# Patient Record
Sex: Female | Born: 1935 | Race: White | Hispanic: No | Smoking: Never smoker
Health system: Southern US, Community
[De-identification: ages and names within clinical notes are randomized; demographics above are authoritative.]

## PROBLEM LIST (undated history)

## (undated) DIAGNOSIS — E785 Hyperlipidemia, unspecified: Secondary | ICD-10-CM

## (undated) DIAGNOSIS — M1990 Unspecified osteoarthritis, unspecified site: Secondary | ICD-10-CM

## (undated) DIAGNOSIS — K219 Gastro-esophageal reflux disease without esophagitis: Secondary | ICD-10-CM

## (undated) DIAGNOSIS — R55 Syncope and collapse: Secondary | ICD-10-CM

## (undated) DIAGNOSIS — I639 Cerebral infarction, unspecified: Secondary | ICD-10-CM

## (undated) DIAGNOSIS — I493 Ventricular premature depolarization: Secondary | ICD-10-CM

## (undated) DIAGNOSIS — S82202A Unspecified fracture of shaft of left tibia, initial encounter for closed fracture: Secondary | ICD-10-CM

## (undated) DIAGNOSIS — D62 Acute posthemorrhagic anemia: Secondary | ICD-10-CM

## (undated) DIAGNOSIS — I10 Essential (primary) hypertension: Secondary | ICD-10-CM

## (undated) DIAGNOSIS — G629 Polyneuropathy, unspecified: Secondary | ICD-10-CM

## (undated) HISTORY — PX: APPENDECTOMY: SHX54

## (undated) HISTORY — DX: Acute posthemorrhagic anemia: D62

## (undated) HISTORY — DX: Unspecified fracture of shaft of left tibia, initial encounter for closed fracture: S82.202A

## (undated) HISTORY — DX: Ventricular premature depolarization: I49.3

## (undated) HISTORY — PX: JOINT REPLACEMENT: SHX530

## (undated) HISTORY — PX: ABDOMINAL HYSTERECTOMY: SHX81

## (undated) HISTORY — PX: BACK SURGERY: SHX140

## (undated) HISTORY — DX: Polyneuropathy, unspecified: G62.9

## (undated) HISTORY — DX: Syncope and collapse: R55

---

## 2004-03-23 ENCOUNTER — Ambulatory Visit: Payer: Self-pay | Admitting: Physician Assistant

## 2004-04-04 ENCOUNTER — Inpatient Hospital Stay: Payer: Self-pay | Admitting: Unknown Physician Specialty

## 2004-11-17 ENCOUNTER — Ambulatory Visit: Payer: Self-pay | Admitting: Internal Medicine

## 2005-11-20 ENCOUNTER — Ambulatory Visit: Payer: Self-pay | Admitting: Internal Medicine

## 2006-02-09 ENCOUNTER — Ambulatory Visit: Payer: Self-pay | Admitting: Unknown Physician Specialty

## 2006-11-29 ENCOUNTER — Ambulatory Visit: Payer: Self-pay | Admitting: Internal Medicine

## 2007-03-06 ENCOUNTER — Ambulatory Visit: Payer: Self-pay | Admitting: Internal Medicine

## 2007-04-03 ENCOUNTER — Ambulatory Visit: Payer: Self-pay | Admitting: Unknown Physician Specialty

## 2007-04-11 ENCOUNTER — Ambulatory Visit: Payer: Self-pay | Admitting: Unknown Physician Specialty

## 2007-11-28 ENCOUNTER — Ambulatory Visit: Payer: Self-pay | Admitting: Internal Medicine

## 2007-12-03 ENCOUNTER — Ambulatory Visit: Payer: Self-pay | Admitting: Internal Medicine

## 2008-02-10 ENCOUNTER — Ambulatory Visit: Payer: Self-pay | Admitting: Unknown Physician Specialty

## 2008-02-19 ENCOUNTER — Ambulatory Visit: Payer: Self-pay | Admitting: Internal Medicine

## 2008-02-20 ENCOUNTER — Ambulatory Visit: Payer: Self-pay | Admitting: Internal Medicine

## 2008-02-24 ENCOUNTER — Inpatient Hospital Stay: Payer: Self-pay | Admitting: Unknown Physician Specialty

## 2008-03-22 ENCOUNTER — Ambulatory Visit: Payer: Self-pay | Admitting: Internal Medicine

## 2008-03-30 ENCOUNTER — Ambulatory Visit: Payer: Self-pay | Admitting: Internal Medicine

## 2008-04-21 ENCOUNTER — Ambulatory Visit: Payer: Self-pay | Admitting: Internal Medicine

## 2008-05-22 HISTORY — PX: REPLACEMENT TOTAL KNEE BILATERAL: SUR1225

## 2008-06-12 ENCOUNTER — Ambulatory Visit: Payer: Self-pay | Admitting: Internal Medicine

## 2008-06-22 ENCOUNTER — Ambulatory Visit: Payer: Self-pay | Admitting: Internal Medicine

## 2008-08-11 ENCOUNTER — Ambulatory Visit: Payer: Self-pay | Admitting: Internal Medicine

## 2008-08-20 ENCOUNTER — Ambulatory Visit: Payer: Self-pay | Admitting: Internal Medicine

## 2008-09-19 ENCOUNTER — Ambulatory Visit: Payer: Self-pay | Admitting: Internal Medicine

## 2008-10-09 ENCOUNTER — Ambulatory Visit: Payer: Self-pay | Admitting: Internal Medicine

## 2008-10-20 ENCOUNTER — Ambulatory Visit: Payer: Self-pay | Admitting: Internal Medicine

## 2008-12-28 ENCOUNTER — Ambulatory Visit: Payer: Self-pay | Admitting: Internal Medicine

## 2009-01-20 ENCOUNTER — Ambulatory Visit: Payer: Self-pay | Admitting: Unknown Physician Specialty

## 2009-02-02 ENCOUNTER — Inpatient Hospital Stay: Payer: Self-pay | Admitting: Unknown Physician Specialty

## 2010-01-05 ENCOUNTER — Ambulatory Visit: Payer: Self-pay | Admitting: Internal Medicine

## 2011-01-09 ENCOUNTER — Ambulatory Visit: Payer: Self-pay | Admitting: Internal Medicine

## 2011-02-06 ENCOUNTER — Ambulatory Visit: Payer: Self-pay | Admitting: Unknown Physician Specialty

## 2011-03-01 ENCOUNTER — Ambulatory Visit: Payer: Self-pay | Admitting: Unknown Physician Specialty

## 2011-03-14 ENCOUNTER — Ambulatory Visit: Payer: Self-pay | Admitting: Unknown Physician Specialty

## 2012-01-10 ENCOUNTER — Ambulatory Visit: Payer: Self-pay

## 2012-05-22 DIAGNOSIS — Z85828 Personal history of other malignant neoplasm of skin: Secondary | ICD-10-CM

## 2012-05-22 HISTORY — DX: Personal history of other malignant neoplasm of skin: Z85.828

## 2013-01-13 ENCOUNTER — Ambulatory Visit: Payer: Self-pay

## 2014-01-20 ENCOUNTER — Ambulatory Visit: Payer: Self-pay

## 2014-08-09 DIAGNOSIS — Z96653 Presence of artificial knee joint, bilateral: Secondary | ICD-10-CM | POA: Insufficient documentation

## 2015-03-12 ENCOUNTER — Other Ambulatory Visit: Payer: Self-pay | Admitting: Infectious Diseases

## 2015-03-12 DIAGNOSIS — Z1231 Encounter for screening mammogram for malignant neoplasm of breast: Secondary | ICD-10-CM

## 2015-03-17 ENCOUNTER — Other Ambulatory Visit: Payer: Self-pay | Admitting: Infectious Diseases

## 2015-03-17 ENCOUNTER — Ambulatory Visit
Admission: RE | Admit: 2015-03-17 | Discharge: 2015-03-17 | Disposition: A | Discharge status: Home / Self Care | Payer: Medicare Other | Source: Ambulatory Visit | Attending: Infectious Diseases | Admitting: Infectious Diseases

## 2015-03-17 DIAGNOSIS — Z1231 Encounter for screening mammogram for malignant neoplasm of breast: Secondary | ICD-10-CM

## 2015-11-25 DIAGNOSIS — R5383 Other fatigue: Secondary | ICD-10-CM | POA: Insufficient documentation

## 2015-11-25 DIAGNOSIS — R42 Dizziness and giddiness: Secondary | ICD-10-CM | POA: Insufficient documentation

## 2015-11-27 ENCOUNTER — Emergency Department: Payer: Medicare Other

## 2015-11-27 ENCOUNTER — Encounter: Payer: Self-pay | Admitting: Emergency Medicine

## 2015-11-27 ENCOUNTER — Inpatient Hospital Stay
Admission: EM | Admit: 2015-11-27 | Discharge: 2015-11-29 | DRG: 066 | Disposition: A | Discharge status: Home / Self Care | Payer: Medicare Other | Attending: Internal Medicine | Admitting: Internal Medicine

## 2015-11-27 DIAGNOSIS — Z9049 Acquired absence of other specified parts of digestive tract: Secondary | ICD-10-CM

## 2015-11-27 DIAGNOSIS — I10 Essential (primary) hypertension: Secondary | ICD-10-CM | POA: Diagnosis present

## 2015-11-27 DIAGNOSIS — R42 Dizziness and giddiness: Secondary | ICD-10-CM

## 2015-11-27 DIAGNOSIS — R29700 NIHSS score 0: Secondary | ICD-10-CM | POA: Diagnosis present

## 2015-11-27 DIAGNOSIS — I639 Cerebral infarction, unspecified: Principal | ICD-10-CM | POA: Diagnosis present

## 2015-11-27 DIAGNOSIS — Z96653 Presence of artificial knee joint, bilateral: Secondary | ICD-10-CM | POA: Diagnosis present

## 2015-11-27 DIAGNOSIS — Z888 Allergy status to other drugs, medicaments and biological substances status: Secondary | ICD-10-CM

## 2015-11-27 DIAGNOSIS — Z803 Family history of malignant neoplasm of breast: Secondary | ICD-10-CM

## 2015-11-27 DIAGNOSIS — Z79899 Other long term (current) drug therapy: Secondary | ICD-10-CM

## 2015-11-27 DIAGNOSIS — K219 Gastro-esophageal reflux disease without esophagitis: Secondary | ICD-10-CM | POA: Diagnosis present

## 2015-11-27 DIAGNOSIS — M1990 Unspecified osteoarthritis, unspecified site: Secondary | ICD-10-CM | POA: Diagnosis present

## 2015-11-27 DIAGNOSIS — Z8249 Family history of ischemic heart disease and other diseases of the circulatory system: Secondary | ICD-10-CM

## 2015-11-27 DIAGNOSIS — Z90710 Acquired absence of both cervix and uterus: Secondary | ICD-10-CM

## 2015-11-27 DIAGNOSIS — I739 Peripheral vascular disease, unspecified: Secondary | ICD-10-CM | POA: Diagnosis present

## 2015-11-27 DIAGNOSIS — G4710 Hypersomnia, unspecified: Secondary | ICD-10-CM | POA: Diagnosis present

## 2015-11-27 HISTORY — DX: Unspecified osteoarthritis, unspecified site: M19.90

## 2015-11-27 HISTORY — DX: Gastro-esophageal reflux disease without esophagitis: K21.9

## 2015-11-27 LAB — COMPREHENSIVE METABOLIC PANEL
ALT: 18 U/L (ref 14–54)
ANION GAP: 6 (ref 5–15)
AST: 22 U/L (ref 15–41)
Albumin: 3.7 g/dL (ref 3.5–5.0)
Alkaline Phosphatase: 54 U/L (ref 38–126)
BUN: 17 mg/dL (ref 6–20)
CHLORIDE: 100 mmol/L — AB (ref 101–111)
CO2: 27 mmol/L (ref 22–32)
CREATININE: 0.88 mg/dL (ref 0.44–1.00)
Calcium: 8.8 mg/dL — ABNORMAL LOW (ref 8.9–10.3)
GFR calc non Af Amer: >60 mL/min (ref >60)
Glucose, Bld: 105 mg/dL — ABNORMAL HIGH (ref 65–99)
POTASSIUM: 4.3 mmol/L (ref 3.5–5.1)
SODIUM: 133 mmol/L — AB (ref 135–145)
Total Bilirubin: 0.3 mg/dL (ref 0.3–1.2)
Total Protein: 6.3 g/dL — ABNORMAL LOW (ref 6.5–8.1)

## 2015-11-27 LAB — URINALYSIS COMPLETE WITH MICROSCOPIC (ARMC ONLY)
Bilirubin Urine: NEGATIVE
GLUCOSE, UA: NEGATIVE mg/dL
HGB URINE DIPSTICK: NEGATIVE
Ketones, ur: NEGATIVE mg/dL
Nitrite: NEGATIVE
PROTEIN: NEGATIVE mg/dL
Specific Gravity, Urine: 1.006 (ref 1.005–1.030)
pH: 5 (ref 5.0–8.0)

## 2015-11-27 LAB — CBC WITH DIFFERENTIAL/PLATELET
Basophils Absolute: 0 10*3/uL (ref 0–0.1)
Basophils Relative: 1 %
Eosinophils Absolute: 0.2 10*3/uL (ref 0–0.7)
Eosinophils Relative: 3 %
HEMATOCRIT: 34.7 % — AB (ref 35.0–47.0)
HEMOGLOBIN: 12 g/dL (ref 12.0–16.0)
LYMPHS ABS: 1.8 10*3/uL (ref 1.0–3.6)
LYMPHS PCT: 32 %
MCH: 32 pg (ref 26.0–34.0)
MCHC: 34.5 g/dL (ref 32.0–36.0)
MCV: 92.8 fL (ref 80.0–100.0)
MONOS PCT: 10 %
Monocytes Absolute: 0.5 10*3/uL (ref 0.2–0.9)
NEUTROS ABS: 3 10*3/uL (ref 1.4–6.5)
NEUTROS PCT: 54 %
Platelets: 185 10*3/uL (ref 150–440)
RBC: 3.74 MIL/uL — ABNORMAL LOW (ref 3.80–5.20)
RDW: 13.7 % (ref 11.5–14.5)
WBC: 5.5 10*3/uL (ref 3.6–11.0)

## 2015-11-27 LAB — TROPONIN I

## 2015-11-27 MED ORDER — SODIUM CHLORIDE 0.9 % IV BOLUS (SEPSIS)
1000.0000 mL | Freq: Once | INTRAVENOUS | Status: AC
  Administered 2015-11-28: 1000 mL via INTRAVENOUS

## 2015-11-27 NOTE — ED Provider Notes (Signed)
[facility] Emergency Department Provider Note   ____________________________________________  Time seen: Approximately 11:13 PM  I have reviewed the triage vital signs and the nursing notes.   HISTORY  Chief Complaint Dizziness    HPI [name] is a 80 y.o. female comes into the hospital today with some headache and dizziness. The patient reports that she's been having these symptoms for the last few days. She reports that she feels as though she feels lightheaded without the room spinning. The patient went to occur note clinic on 76 and was told by the PA that her sodium was low. The patient was told to restrict her water intake to 220 mL daily. She reports though that she's been feeling very tired. She reports that she was on her back porch with her dogs 1 afternoon and fell asleep without realizing it. The patient woke up 2 hours later. She reports that she is to go back for blood work but she has been feeling nauseous and just not feeling right. She reports that the headache was mostly around her eyes and she was also given a Z-Pak. She reports that she has no headache currently and also denies chest pain, shortness of breath, abdominal pain, fevers, diarrhea, vomiting. She reports she does have some blurred vision but she has that due to Fuchs dystrophy. The patient reports that she was on a river cruise in [place] and when she came back she started having the symptoms of dizziness. She is here for evaluation today.   Past Medical History  Diagnosis Date  . Arthritis   . GERD (gastroesophageal reflux disease)     There are no active problems to display for this patient.   Past Surgical History  Procedure Laterality Date  . Replacement total knee bilateral  2010  . Appendectomy    . Abdominal hysterectomy      Current Outpatient Rx  Name  Route  Sig  Dispense  Refill  . ALPRAZolam (XANAX) 0.25 MG tablet   Oral   Take 1 tablet by mouth 3  (three) times daily.         Marland Kitchen azelastine (ASTELIN) 0.1 % nasal spray   Each Nare   Place 1 spray into both nostrils daily.       5   . azithromycin (ZITHROMAX) 250 MG tablet   Oral   Take 1 tablet by mouth daily.      1   . Coenzyme Q10 (CO Q 10) 100 MG CAPS   Oral   Take 100 mg by mouth daily.         Marland Kitchen esomeprazole (NEXIUM) 40 MG capsule   Oral   Take 1 capsule by mouth 2 (two) times daily.         Marland Kitchen etodolac (LODINE) 400 MG tablet   Oral   Take 1 tablet by mouth 2 (two) times daily.         . Multiple Vitamins-Minerals (MULTIVITAMIN WITH MINERALS) tablet   Oral   Take 1 tablet by mouth daily.         . sodium chloride (MURO 128) 5 % ophthalmic ointment   Both Eyes   Place 1 application into both eyes at bedtime.         . VESICARE 5 MG tablet   Oral   Take 1 tablet by mouth daily.           Dispense as written.   . vitamin A 8000 UNIT capsule  Oral   Take 8,000 Units by mouth daily.         . vitamin E 400 UNIT capsule   Oral   Take 400 Units by mouth daily.           Allergies Tramadol-acetaminophen  Family History  Problem Relation Age of Onset  . Breast cancer Maternal Grandmother     Social History Social History  Substance Use Topics  . Smoking status: Never Smoker   . Smokeless tobacco: None  . Alcohol Use: No    Review of Systems Constitutional: No fever/chills Eyes: Intermittently blurred vision ENT: No sore throat. Cardiovascular: Denies chest pain. Respiratory: Denies shortness of breath. Gastrointestinal: No abdominal pain.  No nausea, no vomiting.  No diarrhea.  No constipation. Genitourinary: Negative for dysuria. Musculoskeletal: Negative for back pain. Skin: Negative for rash. Neurological: Headache and lightheadedness  10-point ROS otherwise negative.  ____________________________________________   PHYSICAL EXAM:  VITAL SIGNS: ED Triage Vitals  Enc Vitals Group     BP 11/27/15 2039 169/67  mmHg     Pulse Rate 11/27/15 2039 71     Resp 11/27/15 2039 18     Temp 11/27/15 2039 98 F (36.7 C)     Temp Source 11/27/15 2039 Oral     SpO2 11/27/15 2039 99 %     Weight 11/27/15 2039 190 lb (86.183 kg)     Height 11/27/15 2039 5\' 9"  (1.753 m)     Head Cir --      Peak Flow --      Pain Score --      Pain Loc --      Pain Edu? --      Excl. in [place]? --     Constitutional: Alert and oriented. Well appearing and in no acute distress. Eyes: Conjunctivae are normal. PERRL. EOMI. Head: Atraumatic. Nose: No congestion/rhinnorhea. Mouth/Throat: Mucous membranes are moist.  Oropharynx non-erythematous. Cardiovascular: Normal rate, regular rhythm. Grossly normal heart sounds.  Good peripheral circulation. Respiratory: Normal respiratory effort.  No retractions. Lungs CTAB. Gastrointestinal: Soft and nontender. No distention. Positive bowel sounds  Musculoskeletal: No lower extremity tenderness nor edema.   Neurologic:  Normal speech and language. Cranial nerves II through XII are grossly intact with no focal motor or neuro deficits Skin:  Skin is warm, dry and intact.  Psychiatric: Mood and affect are normal.   ____________________________________________   LABS (all labs ordered are listed, but only abnormal results are displayed)  Labs Reviewed  COMPREHENSIVE METABOLIC PANEL - Abnormal; Notable for the following:    Sodium 133 (*)    Chloride 100 (*)    Glucose, Bld 105 (*)    Calcium 8.8 (*)    Total Protein 6.3 (*)    All other components within normal limits  CBC WITH DIFFERENTIAL/PLATELET - Abnormal; Notable for the following:    RBC 3.74 (*)    HCT 34.7 (*)    All other components within normal limits  URINALYSIS COMPLETEWITH MICROSCOPIC ([facility] ONLY) - Abnormal; Notable for the following:    Color, Urine YELLOW (*)    APPearance CLEAR (*)    Leukocytes, UA 2+ (*)    Bacteria, UA RARE (*)    Squamous Epithelial / LPF 0-5 (*)    All other components within normal  limits  TROPONIN I  MAGNESIUM  PHOSPHORUS  TSH   ____________________________________________  EKG  ED ECG REPORT I, Loney Hering, the attending physician, personally viewed and interpreted this ECG.  Date: 11/27/2015  EKG Time: 2249  Rate: 66  Rhythm: normal sinus rhythm  Axis: Left axis deviation  Intervals:right bundle branch block and left anterior fascicular block  ST&T Change: None  ____________________________________________  RADIOLOGY  CT head: Finding suspicious for subacute ischemia in the posterior limb of the left internal capsule, No intracranial hemorrhage or hemorrhagic transformation ____________________________________________   PROCEDURES  Procedure(s) performed: None  Procedures  Critical Care performed: No  ____________________________________________   INITIAL IMPRESSION / ASSESSMENT AND PLAN / ED COURSE  Pertinent labs & imaging results that were available during my care of the patient were reviewed by me and considered in my medical decision making (see chart for details).  This is a 80 year old female who comes into the hospital today with some lightheadedness, dizziness and nausea. I did look back at the patient's visit and it appears as though her sodium was 125 at the time. Her sodium is currently 133 I will do some orthostatics to determine if the patient has some dehydration. I will also do a CT scan of the patient's head. I will reassess the patient once I receive the remainder of her blood work.  The patient was not orthostatic. She did receive a liter of normal saline. CT does appear to have a finding of a subacute stroke. Given the patient's symptoms of dizziness I feel that the patient needs to be admitted to the hospital for further evaluation. I discussed these findings with the patient. She will be admitted to the hospitalist service. ____________________________________________   FINAL CLINICAL IMPRESSION(S) / ED  DIAGNOSES  Final diagnoses:  Dizziness  Ischemic stroke ([place])      NEW MEDICATIONS STARTED DURING THIS VISIT:  New Prescriptions   No medications on file     Note:  This document was prepared using Dragon voice recognition software and may include unintentional dictation errors.    Loney Hering, MD 11/28/15 [phone]

## 2015-11-27 NOTE — ED Notes (Signed)
Pt ambulatory to triage with c/o of being dizzy, sleepy and nauseous since returning from [place] (vacation) on 20 June 17.  Pt denies fever/HA/V/D.  Pt reports dizziness worse today than usual, visited PCP on Wednesday and rx'd dx of "Low sodium" and has been restricting water intake since.  Pt reports dizziness worse when going from a sitting to standing position.

## 2015-11-28 ENCOUNTER — Inpatient Hospital Stay: Payer: Medicare Other

## 2015-11-28 ENCOUNTER — Inpatient Hospital Stay
Admit: 2015-11-28 | Discharge: 2015-11-28 | Disposition: A | Discharge status: Home / Self Care | Payer: Medicare Other | Attending: Family Medicine | Admitting: Family Medicine

## 2015-11-28 DIAGNOSIS — G4710 Hypersomnia, unspecified: Secondary | ICD-10-CM | POA: Diagnosis present

## 2015-11-28 DIAGNOSIS — I639 Cerebral infarction, unspecified: Secondary | ICD-10-CM | POA: Diagnosis present

## 2015-11-28 DIAGNOSIS — M1990 Unspecified osteoarthritis, unspecified site: Secondary | ICD-10-CM | POA: Diagnosis present

## 2015-11-28 DIAGNOSIS — K219 Gastro-esophageal reflux disease without esophagitis: Secondary | ICD-10-CM | POA: Diagnosis present

## 2015-11-28 DIAGNOSIS — Z96653 Presence of artificial knee joint, bilateral: Secondary | ICD-10-CM | POA: Diagnosis present

## 2015-11-28 DIAGNOSIS — I739 Peripheral vascular disease, unspecified: Secondary | ICD-10-CM | POA: Diagnosis present

## 2015-11-28 DIAGNOSIS — Z79899 Other long term (current) drug therapy: Secondary | ICD-10-CM | POA: Diagnosis not present

## 2015-11-28 DIAGNOSIS — R42 Dizziness and giddiness: Secondary | ICD-10-CM | POA: Diagnosis present

## 2015-11-28 DIAGNOSIS — R29700 NIHSS score 0: Secondary | ICD-10-CM | POA: Diagnosis present

## 2015-11-28 DIAGNOSIS — Z888 Allergy status to other drugs, medicaments and biological substances status: Secondary | ICD-10-CM | POA: Diagnosis not present

## 2015-11-28 DIAGNOSIS — Z9049 Acquired absence of other specified parts of digestive tract: Secondary | ICD-10-CM | POA: Diagnosis not present

## 2015-11-28 DIAGNOSIS — Z803 Family history of malignant neoplasm of breast: Secondary | ICD-10-CM | POA: Diagnosis not present

## 2015-11-28 DIAGNOSIS — Z90710 Acquired absence of both cervix and uterus: Secondary | ICD-10-CM | POA: Diagnosis not present

## 2015-11-28 DIAGNOSIS — I10 Essential (primary) hypertension: Secondary | ICD-10-CM | POA: Diagnosis present

## 2015-11-28 DIAGNOSIS — Z8249 Family history of ischemic heart disease and other diseases of the circulatory system: Secondary | ICD-10-CM | POA: Diagnosis not present

## 2015-11-28 LAB — BASIC METABOLIC PANEL
ANION GAP: 4 — AB (ref 5–15)
BUN: 14 mg/dL (ref 6–20)
CO2: 28 mmol/L (ref 22–32)
Calcium: 8.6 mg/dL — ABNORMAL LOW (ref 8.9–10.3)
Chloride: 108 mmol/L (ref 101–111)
Creatinine, Ser: 0.57 mg/dL (ref 0.44–1.00)
GFR calc Af Amer: >60 mL/min (ref >60)
Glucose, Bld: 86 mg/dL (ref 65–99)
POTASSIUM: 4.1 mmol/L (ref 3.5–5.1)
SODIUM: 140 mmol/L (ref 135–145)

## 2015-11-28 LAB — LIPID PANEL
CHOL/HDL RATIO: 3.2 ratio
Cholesterol: 179 mg/dL (ref 0–200)
HDL: 56 mg/dL (ref >40)
LDL Cholesterol: 111 mg/dL — ABNORMAL HIGH (ref 0–99)
TRIGLYCERIDES: 59 mg/dL (ref <150)
VLDL: 12 mg/dL (ref 0–40)

## 2015-11-28 LAB — CBC
HEMATOCRIT: 36.3 % (ref 35.0–47.0)
HEMOGLOBIN: 12.1 g/dL (ref 12.0–16.0)
MCH: 31.6 pg (ref 26.0–34.0)
MCHC: 33.3 g/dL (ref 32.0–36.0)
MCV: 95.1 fL (ref 80.0–100.0)
Platelets: 185 10*3/uL (ref 150–440)
RBC: 3.82 MIL/uL (ref 3.80–5.20)
RDW: 13.6 % (ref 11.5–14.5)
WBC: 5.9 10*3/uL (ref 3.6–11.0)

## 2015-11-28 LAB — ECHOCARDIOGRAM COMPLETE
Height: 69 in
Weight: 3064 oz

## 2015-11-28 LAB — PHOSPHORUS: Phosphorus: 4.5 mg/dL (ref 2.5–4.6)

## 2015-11-28 LAB — TSH: TSH: 1.991 u[IU]/mL (ref 0.350–4.500)

## 2015-11-28 LAB — MAGNESIUM: Magnesium: 1.9 mg/dL (ref 1.7–2.4)

## 2015-11-28 MED ORDER — DARIFENACIN HYDROBROMIDE ER 7.5 MG PO TB24
7.5000 mg | ORAL_TABLET | Freq: Every day | ORAL | Status: DC
  Administered 2015-11-28 – 2015-11-29 (×2): 7.5 mg via ORAL
  Filled 2015-11-28 (×2): qty 1

## 2015-11-28 MED ORDER — ONDANSETRON HCL 4 MG PO TABS: 4.0000 mg | ORAL_TABLET | Freq: Four times a day (QID) | ORAL | Status: DC | PRN

## 2015-11-28 MED ORDER — PANTOPRAZOLE SODIUM 40 MG PO TBEC
40.0000 mg | DELAYED_RELEASE_TABLET | Freq: Every day | ORAL | Status: DC
  Administered 2015-11-28 – 2015-11-29 (×2): 40 mg via ORAL
  Filled 2015-11-28 (×2): qty 1

## 2015-11-28 MED ORDER — SODIUM CHLORIDE 0.9% FLUSH
3.0000 mL | Freq: Two times a day (BID) | INTRAVENOUS | Status: DC
  Administered 2015-11-28 – 2015-11-29 (×5): 3 mL via INTRAVENOUS

## 2015-11-28 MED ORDER — ONDANSETRON HCL 4 MG/2ML IJ SOLN: 4.0000 mg | Freq: Four times a day (QID) | INTRAMUSCULAR | Status: DC | PRN

## 2015-11-28 MED ORDER — SODIUM CHLORIDE (HYPERTONIC) 5 % OP OINT
1.0000 "application " | TOPICAL_OINTMENT | Freq: Every day | OPHTHALMIC | Status: DC
  Administered 2015-11-28: 1 via OPHTHALMIC
  Filled 2015-11-28: qty 3.5

## 2015-11-28 MED ORDER — ENOXAPARIN SODIUM 40 MG/0.4ML ~~LOC~~ SOLN
40.0000 mg | Freq: Every day | SUBCUTANEOUS | Status: DC
  Administered 2015-11-28 – 2015-11-29 (×2): 40 mg via SUBCUTANEOUS
  Filled 2015-11-28 (×2): qty 0.4

## 2015-11-28 MED ORDER — SENNOSIDES-DOCUSATE SODIUM 8.6-50 MG PO TABS: 1.0000 | ORAL_TABLET | Freq: Every evening | ORAL | Status: DC | PRN

## 2015-11-28 MED ORDER — ASPIRIN EC 325 MG PO TBEC
325.0000 mg | DELAYED_RELEASE_TABLET | Freq: Every day | ORAL | Status: DC
  Administered 2015-11-28 – 2015-11-29 (×2): 325 mg via ORAL
  Filled 2015-11-28 (×2): qty 1

## 2015-11-28 MED ORDER — METOPROLOL TARTRATE 5 MG/5ML IV SOLN: 5.0000 mg | INTRAVENOUS | Status: DC | PRN

## 2015-11-28 MED ORDER — SIMVASTATIN 40 MG PO TABS
40.0000 mg | ORAL_TABLET | Freq: Every day | ORAL | Status: DC
  Administered 2015-11-28: 40 mg via ORAL
  Filled 2015-11-28: qty 1

## 2015-11-28 NOTE — Progress Notes (Signed)
To cardiovascular testing via bed 

## 2015-11-28 NOTE — Plan of Care (Signed)
Problem: Safety: Goal: Ability to remain free from injury will improve Outcome: Progressing Fall precautions in place  Problem: Tissue Perfusion: Goal: Risk factors for ineffective tissue perfusion will decrease Outcome: Progressing Lovenox     Problem: Activity: Goal: Risk for activity intolerance will decrease Outcome: Progressing PT evaluation ordered

## 2015-11-28 NOTE — Progress Notes (Signed)
Back from cardiovascular testing 

## 2015-11-28 NOTE — Progress Notes (Signed)
To MRI via bed

## 2015-11-28 NOTE — H&P (Signed)
PCP:   Ebbie Ridge, MD   Chief Complaint:  Dizziness and headache  HPI: This is a 80 year old female who states she's had dizziness and headache on and off since June 20 when she returned from a cruise from [place]. She's also had some degree of hypersomnolence since. She had nausea but no vomiting. She denies any altered mentation. She states she has had pressure around eyes and more like a sinus infection. She saw her PCP and was prescribed Z-Pak. She does have vision problems but she had seen her ophthalmologist in February. She denies any localized weakness or slurred speech. Her symptoms became worse she came to ER today. In the ER blood pressures were elevated with systolic blood pressures in the 190s. The patient does not have a history of hypertension. She states her systolic blood pressures home is usually the 130s. She checked her blood pressure last 2 days and it has been elevated range between 150-160. History provided by the patient.  Review of Systems:  The patient denies anorexia, fever, weight loss, headache, dizziness, vision loss, decreased hearing, hoarseness, chest pain, syncope, dyspnea on exertion, peripheral edema, balance deficits, hemoptysis, abdominal pain, melena, hematochezia, severe indigestion/heartburn, hematuria, incontinence, genital sores, muscle weakness, suspicious skin lesions, transient blindness, difficulty walking, depression, unusual weight change, abnormal bleeding, enlarged lymph nodes, angioedema, and breast masses.  Past Medical History: Past Medical History  Diagnosis Date  . Arthritis   . GERD (gastroesophageal reflux disease)    Past Surgical History  Procedure Laterality Date  . Replacement total knee bilateral  2010  . Appendectomy    . Abdominal hysterectomy      Medications: Prior to Admission medications   Medication Sig Start Date End Date Taking? Authorizing Provider  ALPRAZolam Duanne Moron) 0.25 MG tablet Take 1 tablet by mouth 3  (three) times daily. 09/03/15  Yes Historical Provider, MD  azelastine (ASTELIN) 0.1 % nasal spray Place 1 spray into both nostrils daily.  11/25/15  Yes Historical Provider, MD  azithromycin (ZITHROMAX) 250 MG tablet Take 1 tablet by mouth daily. 11/25/15  Yes Historical Provider, MD  Coenzyme Q10 (CO Q 10) 100 MG CAPS Take 100 mg by mouth daily.   Yes Historical Provider, MD  esomeprazole (NEXIUM) 40 MG capsule Take 1 capsule by mouth 2 (two) times daily. 11/17/15  Yes Historical Provider, MD  etodolac (LODINE) 400 MG tablet Take 1 tablet by mouth 2 (two) times daily. 10/11/15  Yes Historical Provider, MD  Multiple Vitamins-Minerals (MULTIVITAMIN WITH MINERALS) tablet Take 1 tablet by mouth daily.   Yes Historical Provider, MD  sodium chloride (MURO 128) 5 % ophthalmic ointment Place 1 application into both eyes at bedtime.   Yes Historical Provider, MD  VESICARE 5 MG tablet Take 1 tablet by mouth daily. 09/03/15  Yes Historical Provider, MD  vitamin A 8000 UNIT capsule Take 8,000 Units by mouth daily.   Yes Historical Provider, MD  vitamin E 400 UNIT capsule Take 400 Units by mouth daily.   Yes Historical Provider, MD    Allergies:   Allergies  Allergen Reactions  . Tramadol-Acetaminophen Nausea Only    Other reaction(s): Other (See Comments) AMS    Social History:  reports that she has never smoked. She does not have any smokeless tobacco history on file. She reports that she does not drink alcohol. Her drug history is not on file.  Family History: Family History  Problem Relation Age of Onset  . Breast cancer Maternal Grandmother  Physical Exam: Filed Vitals:   11/28/15 0115 11/28/15 0130 11/28/15 0145 11/28/15 0156  BP:  191/70  191/70  Pulse: 78 79 137 80  Temp:      TempSrc:      Resp: 19 21 22 26   Height:      Weight:      SpO2: 97% 100% 86% 98%    General:  Alert and oriented times three, well developed and nourished, no acute distress Eyes: PERRLA, pink conjunctiva,  no scleral icterus ENT: Moist oral mucosa, neck supple, no thyromegaly Lungs: clear to ascultation, no wheeze, no crackles, no use of accessory muscles Cardiovascular: regular rate and rhythm, no regurgitation, no gallops, no murmurs. No carotid bruits, no JVD Abdomen: soft, positive BS, non-tender, non-distended, no organomegaly, not an acute abdomen GU: not examined Neuro: CN II - XII grossly intact, sensation intact Musculoskeletal: strength 5/5 all extremities, no clubbing, cyanosis or 1+ B/L LE pitting edema Skin: no rash, no subcutaneous crepitation, no decubitus Psych: appropriate patient   Labs on Admission:   Recent Labs  11/27/15 2058  NA 133*  K 4.3  CL 100*  CO2 27  GLUCOSE 105*  BUN 17  CREATININE 0.88  CALCIUM 8.8*  MG 1.9  PHOS 4.5    Recent Labs  11/27/15 2058  AST 22  ALT 18  ALKPHOS 54  BILITOT 0.3  PROT 6.3*  ALBUMIN 3.7   No results for input(s): LIPASE, AMYLASE in the last 72 hours.  Recent Labs  11/27/15 2058  WBC 5.5  NEUTROABS 3.0  HGB 12.0  HCT 34.7*  MCV 92.8  PLT 185    Recent Labs  11/27/15 2058  TROPONINI <0.03   Invalid input(s): POCBNP No results for input(s): DDIMER in the last 72 hours. No results for input(s): HGBA1C in the last 72 hours. No results for input(s): CHOL, HDL, LDLCALC, TRIG, CHOLHDL, LDLDIRECT in the last 72 hours.  Recent Labs  11/27/15 2058  TSH 1.991   No results for input(s): VITAMINB12, FOLATE, FERRITIN, TIBC, IRON, RETICCTPCT in the last 72 hours.  Micro Results: No results found for this or any previous visit (from the past 240 hour(s)).   Radiological Exams on Admission: Ct Head Wo Contrast  11/28/2015  CLINICAL DATA:  Dizziness and headache. EXAM: CT HEAD WITHOUT CONTRAST TECHNIQUE: Contiguous axial images were obtained from the base of the skull through the vertex without intravenous contrast. COMPARISON:  None. FINDINGS: Brain: Ill-defined 1.5 cm hypodensity in the left basal ganglia  in the region of the posterior limb of the internal capsule, concerning for subacute ischemia. No significant mass effect or midline shift.No intracranial hemorrhage or hemorrhagic conversion. Scattered basal gangliar and dural calcifications. No hydrocephalus. The basilar cisterns are patent. No intracranial fluid collection. Vascular: No hyperdense vessel or abnormal calcification. Skull:  Calvarium is intact. Sinuses/Orbits: Included paranasal sinuses and mastoid air cells are well aerated. Other: None. IMPRESSION: Findings suspicious for subacute ischemia in the posterior limb of the left internal capsule. No intracranial hemorrhage or hemorrhagic transformation. Electronically Signed   By: Jeb Levering M.D.   On: 11/28/2015 00:00    Assessment/Plan Present on Admission:  . CVA (cerebral infarction) -Admit to telemetry -Aspirin daily, simvastatin 40 mg daily -Lipid panel -Neurochecks every 2 hours -MRI head, 2-D echo, carotid ultrasounds  HTN malignant - lopressor 5 mg every 4 hours when necessary systolic blood pressure greater than 190   Romel Dumond 11/28/2015, 2:18 AM

## 2015-11-28 NOTE — Progress Notes (Signed)
[place] [facility] [place] NAME: Perrine Shutters    MR#:  [id number]  DATE OF BIRTH:  16-Oct-1935  SUBJECTIVE:  CHIEF COMPLAINT:   Chief Complaint  Patient presents with  . Dizziness   No further dizziness  REVIEW OF SYSTEMS:    Review of Systems  Constitutional: Negative for fever and chills.  HENT: Negative for sore throat.   Eyes: Negative for blurred vision, double vision and pain.  Respiratory: Negative for cough, hemoptysis, shortness of breath and wheezing.   Cardiovascular: Negative for chest pain, palpitations, orthopnea and leg swelling.  Gastrointestinal: Negative for heartburn, nausea, vomiting, abdominal pain, diarrhea and constipation.  Genitourinary: Negative for dysuria and hematuria.  Musculoskeletal: Negative for back pain and joint pain.  Skin: Negative for rash.  Neurological: Negative for sensory change, speech change, focal weakness and headaches.  Endo/Heme/Allergies: Does not bruise/bleed easily.  Psychiatric/Behavioral: Negative for depression. The patient is not nervous/anxious.     DRUG ALLERGIES:   Allergies  Allergen Reactions  . Tramadol-Acetaminophen Nausea Only    Other reaction(s): Other (See Comments) AMS    VITALS:  Blood pressure 182/73, pulse 73, temperature 98.5 F (36.9 C), temperature source Oral, resp. rate 16, height 5\' 9"  (1.753 m), weight 86.864 kg (191 lb 8 oz), SpO2 98 %.  PHYSICAL EXAMINATION:   Physical Exam  GENERAL:  80 y.o.-year-old patient lying in the bed with no acute distress.  EYES: Pupils equal, round, reactive to light and accommodation. No scleral icterus. Extraocular muscles intact.  HEENT: Head atraumatic, normocephalic. Oropharynx and nasopharynx clear.  NECK:  Supple, no jugular venous distention. No thyroid enlargement, no tenderness.  LUNGS: Normal breath sounds bilaterally, no wheezing, rales, rhonchi. No use of accessory muscles of respiration.   CARDIOVASCULAR: S1, S2 normal. No murmurs, rubs, or gallops.  ABDOMEN: Soft, nontender, nondistended. Bowel sounds present. No organomegaly or mass.  EXTREMITIES: No cyanosis, clubbing or edema b/l.    NEUROLOGIC: Cranial nerves II through XII are intact. No focal Motor or sensory deficits b/l.   PSYCHIATRIC: The patient is alert and oriented x 3.  SKIN: No obvious rash, lesion, or ulcer.   LABORATORY PANEL:   CBC  Recent Labs Lab 11/28/15 0533  WBC 5.9  HGB 12.1  HCT 36.3  PLT 185   ------------------------------------------------------------------------------------------------------------------ Chemistries   Recent Labs Lab 11/27/15 2058 11/28/15 0533  NA 133* 140  K 4.3 4.1  CL 100* 108  CO2 27 28  GLUCOSE 105* 86  BUN 17 14  CREATININE 0.88 0.57  CALCIUM 8.8* 8.6*  MG 1.9  --   AST 22  --   ALT 18  --   ALKPHOS 54  --   BILITOT 0.3  --    ------------------------------------------------------------------------------------------------------------------  Cardiac Enzymes  Recent Labs Lab 11/27/15 2058  TROPONINI <0.03   ------------------------------------------------------------------------------------------------------------------  RADIOLOGY:  Ct Head Wo Contrast  11/28/2015  CLINICAL DATA:  Dizziness and headache. EXAM: CT HEAD WITHOUT CONTRAST TECHNIQUE: Contiguous axial images were obtained from the base of the skull through the vertex without intravenous contrast. COMPARISON:  None. FINDINGS: Brain: Ill-defined 1.5 cm hypodensity in the left basal ganglia in the region of the posterior limb of the internal capsule, concerning for subacute ischemia. No significant mass effect or midline shift.No intracranial hemorrhage or hemorrhagic conversion. Scattered basal gangliar and dural calcifications. No hydrocephalus. The basilar cisterns are patent. No intracranial fluid collection. Vascular: No hyperdense vessel or abnormal calcification. Skull:  Calvarium is  intact.  Sinuses/Orbits: Included paranasal sinuses and mastoid air cells are well aerated. Other: None. IMPRESSION: Findings suspicious for subacute ischemia in the posterior limb of the left internal capsule. No intracranial hemorrhage or hemorrhagic transformation. Electronically Signed   By: Jeb Levering M.D.   On: 11/28/2015 00:00     ASSESSMENT AND PLAN:   * Acute CVA of left internal capsule -Check MRI of the brain, Carotid dopplers, Echo - Start aspirin and statin. - Lovenox for DVT prophylaxis. - PT/OT/Speech consult as needed per symptoms - Neuro checks every 4 hours for 24 hours. - Consult neurology.  * Hypertension New Due to CVA? Monitor No medications for 48 hrs   All the records are reviewed and case discussed with Care Management/Social Workerr. Management plans discussed with the patient, family and they are in agreement.  CODE STATUS: FULL  DVT Prophylaxis: Lovenox  TOTAL TIME TAKING CARE OF THIS PATIENT: 30 minutes.   POSSIBLE D/C IN 1-2 DAYS, DEPENDING ON CLINICAL CONDITION.  Hillary Bow R M.D on 11/28/2015 at 9:16 AM  Between 7am to 6pm - Pager - [phone]  After 6pm go to [email or web address] - password [email or web address]  Office  [phone]  CC: Primary care physician; Ebbie Ridge, MD  Note: This dictation was prepared with Dragon dictation along with smaller phrase technology. Any transcriptional errors that result from this process are unintentional.

## 2015-11-28 NOTE — Progress Notes (Signed)
Back from MRI.

## 2015-11-28 NOTE — Evaluation (Signed)
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  PMHx:  GERD, B TKA's,   Clinical Impression  Pt assessed for her balance and control with and without walker.  Pt prefers to use no AD but do not agree to this for now.  Has minimal help available to her at home and depending on progress may need to have more care at discharge.  For now anticipate good recovery to HHPT and will continue PT acutely to progress her to this end.  Focus on balance and quality of gait in PT acutely.    Follow Up Recommendations Home health PT;Supervision for mobility/OOB    Equipment Recommendations  None recommended by PT    Recommendations for Other Services Rehab consult     Precautions / Restrictions Precautions Precautions: Fall (telemetry) Restrictions Weight Bearing Restrictions: No      Mobility  Bed Mobility Overal bed mobility: Needs Assistance Bed Mobility: Supine to Sit     Supine to sit: Min assist     General bed mobility comments: using hands on bedrails and elevated HOB  Transfers Overall transfer level: Needs assistance Equipment used: Rolling walker (2 wheeled);1 person hand held assist Transfers: Sit to/from [facility] Sit to Stand: Min assist Stand pivot transfers: Min guard       General transfer comment: has minor unsteadiness with initial standing and used RW to control but pt wants to set aside  Ambulation/Gait Ambulation/Gait assistance: Min assist;Min guard Ambulation Distance (Feet): 300 Feet Assistive device: Rolling walker (2 wheeled);1 person hand held assist Gait Pattern/deviations: Step-through pattern;Narrow base of support;Decreased stride length Gait  velocity: reduced Gait velocity interpretation: Below normal speed for age/gender General Gait Details: controlled gait pattern with RW but pt prefers to walk with nothing and hold furniture  Stairs            Wheelchair Mobility    Modified Rankin (Stroke Patients Only) Modified Rankin (Stroke Patients Only) Pre-Morbid Rankin Score: No symptoms Modified Rankin: Moderate disability     Balance Overall balance assessment: Needs assistance Sitting-balance support: Feet supported Sitting balance-Leahy Scale: Good     Standing balance support: Bilateral upper extremity supported Standing balance-Leahy Scale: Fair Standing balance comment: less than fair dynamically                             Pertinent Vitals/Pain Pain Assessment: No/denies pain    Home Living Family/patient expects to be discharged to:: Private residence Living Arrangements: Alone Available Help at Discharge: Family;Available PRN/intermittently Type of Home: House Home Access: Stairs to enter Entrance Stairs-Rails: Right;Left;Can reach both Entrance Stairs-Number of Steps: 6 Home Layout: Two level Home Equipment: [place] - 2 wheels;Cane - single point Additional Comments: has 2 dogs that might be a trip hazard    Prior Function Level of Independence: Independent with assistive device(s)               Hand Dominance        Extremity/Trunk Assessment   Upper Extremity Assessment: Overall WFL for tasks assessed           Lower Extremity Assessment: Overall WFL for tasks assessed      Cervical /  Trunk Assessment: Normal  Communication   Communication: No difficulties  Cognition Arousal/Alertness: Awake/alert Behavior During Therapy: WFL for tasks assessed/performed Overall Cognitive Status: Within Functional Limits for tasks assessed                      General Comments General comments (skin integrity, edema, etc.): Pt is motivated to get up to walk and  has been traveling just prior to her stroke.  Has high expectations for herself and may be unrealistic about where she is with safety for now, but expect good recovery.    Exercises        Assessment/Plan    PT Assessment Patient needs continued PT services  PT Diagnosis Difficulty walking   PT Problem List Decreased range of motion;Decreased activity tolerance;Decreased strength;Decreased balance;Decreased mobility;Decreased coordination;Decreased knowledge of use of DME;Decreased safety awareness;Cardiopulmonary status limiting activity  PT Treatment Interventions DME instruction;Gait training;Stair training;Functional mobility training;Therapeutic activities;Therapeutic exercise;Balance training;Neuromuscular re-education;Patient/family education   PT Goals (Current goals can be found in the Care Plan section) Acute Rehab PT Goals Patient Stated Goal: to get home to her dogs PT Goal Formulation: With patient/family Time For Goal Achievement: 12/12/15 Potential to Achieve Goals: Good    Frequency 7X/week   Barriers to discharge Decreased caregiver support;Inaccessible home environment stairs to enter and within house    Co-evaluation               End of Session Equipment Utilized During Treatment: Gait belt Activity Tolerance: Patient tolerated treatment well;Treatment limited secondary to medical complications (Comment) (BP after gait was 189/81.) Patient left: in chair;with call bell/phone within reach;with chair alarm set;with nursing/sitter in room;with family/visitor present Nurse Communication: Mobility status         Time: [id number] PT Time Calculation (min) (ACUTE ONLY): 26 min   Charges:   PT Evaluation $PT Eval Moderate Complexity: 1 Procedure PT Treatments $Gait Training: 8-22 mins   PT G CodesRamond Dial 2015-12-07, 4:12 PM  Mee Hives, PT MS Acute Rehab Dept. Number: [place] [facility] [place]

## 2015-11-29 DIAGNOSIS — I639 Cerebral infarction, unspecified: Principal | ICD-10-CM

## 2015-11-29 MED ORDER — ASPIRIN 325 MG PO TBEC: 325.0000 mg | DELAYED_RELEASE_TABLET | Freq: Every day | ORAL | Status: DC

## 2015-11-29 MED ORDER — SIMVASTATIN 40 MG PO TABS: 40.0000 mg | ORAL_TABLET | Freq: Every day | ORAL | Status: DC

## 2015-11-29 MED ORDER — ASPIRIN 325 MG PO TBEC
325.0000 mg | DELAYED_RELEASE_TABLET | Freq: Every day | ORAL | Status: DC
Start: 2015-11-29 — End: 2024-04-19

## 2015-11-29 NOTE — Consult Note (Addendum)
Referring Physician: Manuella Ghazi    Chief Complaint: Dizziness  HPI: [name] is an 80 y.o. female who returned from a trip to [place] at the end of June.  On June 22 had an episdoe of headache and dizziness that resolved.  Did well until July 4 when she had another episode of dizziness that did not resolve.  She saw her outpatient MD who prescribed a Z-pak but this did not relieve her dizziness.  Presented for evaluation on 7/9 for evaluation.  Initial NIHSS of 0.    Date last known well: 11/23/2015 Time last known well: Time: 12:00 tPA Given: No: Outside time window  MRankin: 0  Past Medical History  Diagnosis Date  . Arthritis   . GERD (gastroesophageal reflux disease)     Past Surgical History  Procedure Laterality Date  . Replacement total knee bilateral  2010  . Appendectomy    . Abdominal hysterectomy      Family History  Problem Relation Age of Onset  . Breast cancer Maternal Grandmother    Father died at the age of 4 of an MI.  Mother died with CHF.    Social History:  reports that she has never smoked. She does not have any smokeless tobacco history on file. She reports that she does not drink alcohol. Her drug history is not on file.  Allergies:  Allergies  Allergen Reactions  . Tramadol-Acetaminophen Nausea Only    Other reaction(s): Other (See Comments) AMS    Medications:  I have reviewed the patient's current medications. Prior to Admission:  Prescriptions prior to admission  Medication Sig Dispense Refill Last Dose  . ALPRAZolam (XANAX) 0.25 MG tablet Take 1 tablet by mouth 3 (three) times daily.   prn at Unknown time  . azelastine (ASTELIN) 0.1 % nasal spray Place 1 spray into both nostrils daily.   5 11/27/2015 at Unknown time  . azithromycin (ZITHROMAX) 250 MG tablet Take 1 tablet by mouth daily.  1 11/27/2015 at Unknown time  . Coenzyme Q10 (CO Q 10) 100 MG CAPS Take 100 mg by mouth daily.   11/27/2015 at Unknown time  . esomeprazole (NEXIUM) 40 MG  capsule Take 1 capsule by mouth 2 (two) times daily.   11/27/2015 at Unknown time  . etodolac (LODINE) 400 MG tablet Take 1 tablet by mouth 2 (two) times daily.   11/27/2015 at Unknown time  . Multiple Vitamins-Minerals (MULTIVITAMIN WITH MINERALS) tablet Take 1 tablet by mouth daily.   11/27/2015 at Unknown time  . sodium chloride (MURO 128) 5 % ophthalmic ointment Place 1 application into both eyes at bedtime.   11/26/2015 at Unknown time  . VESICARE 5 MG tablet Take 1 tablet by mouth daily.   11/27/2015 at Unknown time  . vitamin A 8000 UNIT capsule Take 8,000 Units by mouth daily.   11/27/2015 at Unknown time  . vitamin E 400 UNIT capsule Take 400 Units by mouth daily.   11/27/2015 at Unknown time   Scheduled: . aspirin EC  325 mg Oral Daily  . darifenacin  7.5 mg Oral Daily  . enoxaparin (LOVENOX) injection  40 mg Subcutaneous Daily  . pantoprazole  40 mg Oral QAC breakfast  . simvastatin  40 mg Oral q1800  . sodium chloride  1 application Both Eyes QHS  . sodium chloride flush  3 mL Intravenous Q12H    ROS: History obtained from the patient  General ROS: negative for - chills, fatigue, fever, night sweats, weight gain or  weight loss Psychological ROS: negative for - behavioral disorder, hallucinations, memory difficulties, mood swings or suicidal ideation Ophthalmic ROS: negative for - blurry vision, double vision, eye pain or loss of vision ENT ROS: negative for - epistaxis, nasal discharge, oral lesions, sore throat, tinnitus Allergy and Immunology ROS: negative for - hives or itchy/watery eyes Hematological and Lymphatic ROS: negative for - bleeding problems, bruising or swollen lymph nodes Endocrine ROS: negative for - galactorrhea, hair pattern changes, polydipsia/polyuria or temperature intolerance Respiratory ROS: negative for - cough, hemoptysis, shortness of breath or wheezing Cardiovascular ROS: negative for - chest pain, dyspnea on exertion, edema or irregular  heartbeat Gastrointestinal ROS: nausea Genito-Urinary ROS: negative for - dysuria, hematuria, incontinence or urinary frequency/urgency Musculoskeletal ROS: negative for - joint swelling or muscular weakness Neurological ROS: as noted in HPI Dermatological ROS: negative for rash and skin lesion changes  Physical Examination: Blood pressure 135/64, pulse 77, temperature 98.3 F (36.8 C), temperature source Oral, resp. rate 14, height 5\' 9"  (1.753 m), weight 83.008 kg (183 lb), SpO2 99 %.  HEENT-  Normocephalic, no lesions, without obvious abnormality.  Normal external eye and conjunctiva.  Normal TM's bilaterally.  Normal auditory canals and external ears. Normal external nose, mucus membranes and septum.  Normal pharynx. Cardiovascular- S1, S2 normal, pulses palpable throughout   Lungs- chest clear, no wheezing, rales, normal symmetric air entry Abdomen- soft, non-tender; bowel sounds normal; no masses,  no organomegaly Extremities- mild lower extremity edema, left greater than right Lymph-no adenopathy palpable Musculoskeletal-no joint tenderness, deformity or swelling Skin-warm and dry, no hyperpigmentation, vitiligo, or suspicious lesions  Neurological Examination Mental Status: Alert, oriented, thought content appropriate.  Speech fluent without evidence of aphasia.  Able to follow 3 step commands without difficulty. Cranial Nerves: II: Discs flat bilaterally; Visual fields grossly normal, pupils equal, round, reactive to light and accommodation III,IV, VI: ptosis not present, extra-ocular motions intact bilaterally V,VII: smile symmetric, facial light touch sensation normal bilaterally VIII: hearing normal bilaterally IX,X: gag reflex present XI: bilateral shoulder shrug XII: midline tongue extension Motor: Right : Upper extremity   5/5    Left:     Upper extremity   5/5  Lower extremity   5/5     Lower extremity   5/5 Tone and bulk:normal tone throughout; no atrophy  noted Sensory: Pinprick and light touch intact throughout, bilaterally Deep Tendon Reflexes: 2+ in the upper extremities, 1+ at the knees and absent at the ankles.   Plantars: Right: mute   Left: mute Cerebellar: Normal finger-to-nose and normal heel-to-shin testing bilaterally Gait: normal gait and station   Laboratory Studies:  Basic Metabolic Panel:  Recent Labs Lab 11/27/15 2058 11/28/15 0533  NA 133* 140  K 4.3 4.1  CL 100* 108  CO2 27 28  GLUCOSE 105* 86  BUN 17 14  CREATININE 0.88 0.57  CALCIUM 8.8* 8.6*  MG 1.9  --   PHOS 4.5  --     Liver Function Tests:  Recent Labs Lab 11/27/15 2058  AST 22  ALT 18  ALKPHOS 54  BILITOT 0.3  PROT 6.3*  ALBUMIN 3.7   No results for input(s): LIPASE, AMYLASE in the last 168 hours. No results for input(s): AMMONIA in the last 168 hours.  CBC:  Recent Labs Lab 11/27/15 2058 11/28/15 0533  WBC 5.5 5.9  NEUTROABS 3.0  --   HGB 12.0 12.1  HCT 34.7* 36.3  MCV 92.8 95.1  PLT 185 185    Cardiac Enzymes:  Recent  Labs Lab 11/27/15 2058  TROPONINI <0.03    BNP: Invalid input(s): POCBNP  CBG: No results for input(s): GLUCAP in the last 168 hours.  Microbiology: No results found for this or any previous visit.  Coagulation Studies: No results for input(s): LABPROT, INR in the last 72 hours.  Urinalysis:  Recent Labs Lab 11/27/15 2058  COLORURINE YELLOW*  LABSPEC 1.006  PHURINE 5.0  GLUCOSEU NEGATIVE  HGBUR NEGATIVE  BILIRUBINUR NEGATIVE  KETONESUR NEGATIVE  PROTEINUR NEGATIVE  NITRITE NEGATIVE  LEUKOCYTESUR 2+*    Lipid Panel:    Component Value Date/Time   CHOL 179 11/28/2015 0533   TRIG 59 11/28/2015 0533   HDL 56 11/28/2015 0533   CHOLHDL 3.2 11/28/2015 0533   VLDL 12 11/28/2015 0533   LDLCALC 111* 11/28/2015 0533    HgbA1C: No results found for: HGBA1C  Urine Drug Screen:  No results found for: LABOPIA, COCAINSCRNUR, LABBENZ, AMPHETMU, THCU, LABBARB  Alcohol Level: No results  for input(s): ETH in the last 168 hours.  Other results: EKG: sinus rhythm at 66 bpm.  Imaging: Ct Head Wo Contrast  11/28/2015  CLINICAL DATA:  Dizziness and headache. EXAM: CT HEAD WITHOUT CONTRAST TECHNIQUE: Contiguous axial images were obtained from the base of the skull through the vertex without intravenous contrast. COMPARISON:  None. FINDINGS: Brain: Ill-defined 1.5 cm hypodensity in the left basal ganglia in the region of the posterior limb of the internal capsule, concerning for subacute ischemia. No significant mass effect or midline shift.No intracranial hemorrhage or hemorrhagic conversion. Scattered basal gangliar and dural calcifications. No hydrocephalus. The basilar cisterns are patent. No intracranial fluid collection. Vascular: No hyperdense vessel or abnormal calcification. Skull:  Calvarium is intact. Sinuses/Orbits: Included paranasal sinuses and mastoid air cells are well aerated. Other: None. IMPRESSION: Findings suspicious for subacute ischemia in the posterior limb of the left internal capsule. No intracranial hemorrhage or hemorrhagic transformation. Electronically Signed   By: Jeb Levering M.D.   On: 11/28/2015 00:00   Mr Brain Wo Contrast  11/28/2015  ADDENDUM REPORT: 11/28/2015 13:49 ADDENDUM: Study discussed by telephone with Dr. Lenoria Chime On 11/28/2015 At 1336 hours. Electronically Signed   By: Genevie Ann M.D.   On: 11/28/2015 13:49  11/28/2015  CLINICAL DATA:  80 year old female with intermittent dizziness and headache since returning from an overseas trip to [place] in late June. Periorbital pressure. Progressive symptoms and she presented to the emergency department with hypertension (systolic blood pressure [id number]). Initial encounter. EXAM: MRI HEAD WITHOUT CONTRAST TECHNIQUE: Multiplanar, multiecho pulse sequences of the brain and surrounding structures were obtained without intravenous contrast. COMPARISON:  Head CT without contrast 11/27/2015. FINDINGS: Patchy 2 cm area of  restricted diffusion in the central and anterior right thalamus, corresponding to the area of CT heterogeneity seen yesterday. Associated T2 and FLAIR hyperintensity. Mild asymmetric enlargement of the thalamus, and evidence of trace petechial hemorrhage (T2* imaging series 9, image 14). No significant regional mass effect. No other areas of restricted diffusion. Major intracranial vascular flow voids are preserved, with a degree of generalized intracranial artery dolichoectasia. Outside of the acute findings there are scattered mild to moderate for age nonspecific T2 and FLAIR hyperintense changes in the cerebral white matter. No cortical encephalomalacia. No other cerebral blood products identified. No areas of chronic ischemia identified in the deep gray matter, brainstem, or cerebellum. No mass lesion, ventriculomegaly, extra-axial collection. Cervicomedullary junction and pituitary are within normal limits. Visible internal auditory structures appear normal. Visualized paranasal sinuses and mastoids are stable and well  pneumatized. Degenerative joint effusion left TMJ. Partially visible degenerative changes in the cervical spine. Generally normal bone marrow signal. Negative orbit and scalp soft tissues. IMPRESSION: 1. Acute 2 cm patchy infarct in the right thalamus with petechial hemorrhage. No significant mass effect. This corresponds to the CT finding yesterday. 2. No other acute intracranial abnormality and otherwise mild for age nonspecific white matter signal changes. Electronically Signed: By: Genevie Ann M.D. On: 11/28/2015 13:05   US Carotid Bilateral  11/28/2015  CLINICAL DATA:  CVA EXAM: BILATERAL CAROTID DUPLEX ULTRASOUND TECHNIQUE: Pearline Cables scale imaging, color Doppler and duplex ultrasound were performed of bilateral carotid and vertebral arteries in the neck. COMPARISON:  None. FINDINGS: Criteria: Quantification of carotid stenosis is based on velocity parameters that correlate the residual internal  carotid diameter with NASCET-based stenosis levels, using the diameter of the distal internal carotid lumen as the denominator for stenosis measurement. The following velocity measurements were obtained: RIGHT ICA:  73 cm/sec CCA:  96 cm/sec SYSTOLIC ICA/CCA RATIO:  0.8 DIASTOLIC ICA/CCA RATIO:  0.9 ECA:  115 cm/sec LEFT ICA:  110 cm/sec CCA:  78 cm/sec SYSTOLIC ICA/CCA RATIO:  1.4 DIASTOLIC ICA/CCA RATIO:  2.1 ECA:  114 cm/sec RIGHT CAROTID ARTERY: Mild calcified plaque in the bulb. Low resistance internal carotid Doppler pattern. RIGHT VERTEBRAL ARTERY:  Antegrade. LEFT CAROTID ARTERY: Moderate calcified plaque in the left bulb. This does shadow portions of the lumen. Low resistance internal carotid Doppler pattern. LEFT VERTEBRAL ARTERY:  Antegrade. IMPRESSION: Less than 50% stenosis in the right and left internal carotid arteries. Electronically Signed   By: Marybelle Killings M.D.   On: 11/28/2015 13:22    Assessment: 80 y.o. female presenting with dizziness and elevated BP.  MRI of the brain personally reviewed and shows a right thalamic infarct with an area of petechial hemorrhage.  Site typical for small vessel disease secondary to hypertension.  Patient on no antiplatelet therapy at home.  Carotid dopplers show no evidence of hemodynamically significant stenosis.  Echocardiogram shows LVH, no cardiac source of emboli with an EF of 60-65%%.  LDL 111.  Stroke Risk Factors - hypertension  Plan: 1. HgbA1c 2. Prophylactic therapy-Antiplatelet med: Aspirin - dose 81mg  daily 3. Statin initiation with target LDL of less than 70.   4. Follow up with neurology on an outpatient basis 5. BP has improved since admission.  To check at home regularly on an outpatient basis.      Alexis Goodell, MD Neurology [phone] 11/29/2015, 3:22 PM

## 2015-11-29 NOTE — Progress Notes (Signed)
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  PMHx:  GERD, B TKA's,     PT Comments    Pt ready for session.  States she is feeling well and hopes to go home today.  Stated she does not feel like she needs a walker to ambulate and does not plan on using on upon discharge.  Transitioned supine to standing with modified independent.  She initially used walker for 78' with good gait quality and without difficulty.  Continued for a total of 320' without assistive device and without loss of balance and overall good quality.  PT does ambulate with bilateral legs externally rotated which she attributes to her "bad feet".  States she does have walkers and canes at home if she feels she needs them.     Follow Up Recommendations  Home health PT;Supervision for mobility/OOB     Equipment Recommendations  None recommended by PT    Recommendations for Other Services       Precautions / Restrictions Precautions Precautions: Fall Restrictions Weight Bearing Restrictions: No    Mobility  Bed Mobility Overal bed mobility: Modified Independent Bed Mobility: Supine to Sit     Supine to sit: Modified independent (Device/Increase time)     General bed mobility comments: rails used  Transfers Overall transfer level: Modified independent Equipment used: None Transfers: Sit to/from Stand Sit to Stand: Modified independent (Device/Increase time)         General transfer comment: steady today  Ambulation/Gait Ambulation/Gait assistance: Supervision Ambulation Distance (Feet): 320 Feet Assistive device: None;Rolling walker (2 wheeled) (initially 9' with RW then continued without AD ) Gait  Pattern/deviations: Step-through pattern Gait velocity: reduced Gait velocity interpretation: Below normal speed for age/gender General Gait Details: steady today   Stairs            Wheelchair Mobility    Modified Rankin (Stroke Patients Only)       Balance Overall balance assessment: Modified Independent Sitting-balance support: Feet supported Sitting balance-Leahy Scale: Normal     Standing balance support: No upper extremity supported Standing balance-Leahy Scale: Fair                      Cognition Arousal/Alertness: Awake/alert Behavior During Therapy: WFL for tasks assessed/performed Overall Cognitive Status: Within Functional Limits for tasks assessed                      Exercises      General Comments General comments (skin integrity, edema, etc.): Pt with improvement today in overall mobility.        Pertinent Vitals/Pain Pain Assessment: No/denies pain    Home Living                      Prior Function            PT Goals (current goals can now be found in the care plan section) Progress towards PT goals: Progressing toward goals    Frequency  7X/week    PT Plan Current plan remains appropriate    Co-evaluation             End of Session Equipment Utilized During Treatment: Gait belt  Activity Tolerance: Patient tolerated treatment well Patient left: in chair;with call bell/phone within reach     Time: 0902-0916 PT Time Calculation (min) (ACUTE ONLY): 14 min  Charges:  $Gait Training: 8-22 mins                    G Codes:      Chesley Noon, PTA 11/29/2015, 9:39 AM

## 2015-11-29 NOTE — Discharge Instructions (Signed)
Stroke Prevention °Some health problems and behaviors may make it more likely for you to have a stroke. Below are ways to lessen your risk of having a stroke.  °· Be active for at least 30 minutes on most or all days. °· Do not smoke. Try not to be around others who smoke. °· Do not drink too much alcohol. °¨ Do not have more than 2 drinks a day if you are a man. °¨ Do not have more than 1 drink a day if you are a woman and are not pregnant. °· Eat healthy foods, such as fruits and vegetables. If you were put on a specific diet, follow the diet as told. °· Keep your cholesterol levels under control through diet and medicines. Look for foods that are low in saturated fat, trans fat, cholesterol, and are high in fiber. °· If you have diabetes, follow all diet plans and take your medicine as told. °· Ask your doctor if you need treatment to lower your blood pressure. If you have high blood pressure (hypertension), follow all diet plans and take your medicine as told by your doctor. °· If you are 18-39 years old, have your blood pressure checked every 3-5 years. If you are age 40 or older, have your blood pressure checked every year. °· Keep a healthy weight. Eat foods that are low in calories, salt, saturated fat, trans fat, and cholesterol. °· Do not take drugs. °· Avoid birth control pills, if this applies. Talk to your doctor about the risks of taking birth control pills. °· Talk to your doctor if you have sleep problems (sleep apnea). °· Take all medicine as told by your doctor. °¨ You may be told to take aspirin or blood thinner medicine. Take this medicine as told by your doctor. °¨ Understand your medicine instructions. °· Make sure any other conditions you have are being taken care of. °GET HELP RIGHT AWAY IF: °· You suddenly lose feeling (you feel numb) or have weakness in your face, arm, or leg. °· Your face or eyelid hangs down to one side. °· You suddenly feel confused. °· You have trouble talking (aphasia)  or understanding what people are saying. °· You suddenly have trouble seeing in one or both eyes. °· You suddenly have trouble walking. °· You are dizzy. °· You lose your balance or your movements are clumsy (uncoordinated). °· You suddenly have a very bad headache and you do not know the cause. °· You have new chest pain. °· Your heart feels like it is fluttering or skipping a beat (irregular heartbeat). °Do not wait to see if the symptoms above go away. Get help right away. Call your local emergency services (911 in [place] Do not drive yourself to the hospital. °  °This information is not intended to replace advice given to you by your health care provider. Make sure you discuss any questions you have with your health care provider. °  °Document Released: 11/07/2011 Document Revised: 05/29/2014 Document Reviewed: 11/08/2012 °Elsevier Interactive Patient Education ©2016 [employer] ° °

## 2015-11-29 NOTE — Progress Notes (Signed)
Patient discharged home at 1640,instructions explained and well understood,follow up appointment made,vital signs within normal limits,escorted by staff member and family via wheel chair.

## 2015-11-29 NOTE — Care Management (Addendum)
Patient presents with dizziness and headache. Recent cruise to [place]. She has ruled in for cva. and no deficits.  Physical therapy recommended physical therapy but patient does not feels she needs home therapy.  Updated attending.  Independent in all adls, denies issues accessing medical care, obtaining medications or with transportation.  Current with her PCP.  Neuro consult is in progress.  Anticipating discharge today.

## 2015-11-30 LAB — HEMOGLOBIN A1C: HEMOGLOBIN A1C: 5.4 % (ref 4.0–6.0)

## 2015-12-01 NOTE — Discharge Summary (Signed)
[facility] [place] NAME: [name]    MR#:  [id number]  DATE OF BIRTH:  [birth year]  DATE OF ADMISSION:  11/27/2015 ADMITTING PHYSICIAN: Quintella Baton, MD  DATE OF DISCHARGE: 11/29/2015  4:43 PM  PRIMARY CARE PHYSICIAN: Ebbie Ridge, MD    ADMISSION DIAGNOSIS:  Dizziness [R42] Ischemic stroke ([place]) [I63.9]  DISCHARGE DIAGNOSIS:  Active Problems:   CVA (cerebral infarction)  SECONDARY DIAGNOSIS:   Past Medical History  Diagnosis Date  . Arthritis   . GERD (gastroesophageal reflux disease)     HOSPITAL COURSE:  80 y.o. female admitted with dizziness and elevated BP. MRI of the brain shows a right thalamic infarct with an area of petechial hemorrhage. Site typical for small vessel disease secondary to hypertension. Patient on no antiplatelet therapy at home. Carotid dopplers show no evidence of hemodynamically significant stenosis. Echocardiogram shows LVH, no cardiac source of emboli with an EF of 60-65%%. LDL 111.  * Acute CVA of left internal capsule: Discharged on aspirin and statin.  DISCHARGE CONDITIONS:   stable  CONSULTS OBTAINED:     DRUG ALLERGIES:   Allergies  Allergen Reactions  . Tramadol-Acetaminophen Nausea Only    Other reaction(s): Other (See Comments) AMS    DISCHARGE MEDICATIONS:   Discharge Medication List as of 11/29/2015  4:05 PM    START taking these medications   Details  aspirin EC 325 MG EC tablet Take 1 tablet (325 mg total) by mouth daily., Starting 11/29/2015, Until Discontinued, Normal    simvastatin (ZOCOR) 40 MG tablet Take 1 tablet (40 mg total) by mouth daily at 6 PM., Starting 11/29/2015, Until Discontinued, Normal      CONTINUE these medications which have NOT CHANGED   Details  ALPRAZolam (XANAX) 0.25 MG tablet Take 1 tablet by mouth 3 (three) times daily., Starting 09/03/2015, Until Discontinued, Historical Med    azelastine (ASTELIN) 0.1 % nasal  spray Place 1 spray into both nostrils daily. , Starting 11/25/2015, Until Discontinued, Historical Med    Coenzyme Q10 (CO Q 10) 100 MG CAPS Take 100 mg by mouth daily., Until Discontinued, Historical Med    esomeprazole (NEXIUM) 40 MG capsule Take 1 capsule by mouth 2 (two) times daily., Starting 11/17/2015, Until Discontinued, Historical Med    etodolac (LODINE) 400 MG tablet Take 1 tablet by mouth 2 (two) times daily., Starting 10/11/2015, Until Discontinued, Historical Med    Multiple Vitamins-Minerals (MULTIVITAMIN WITH MINERALS) tablet Take 1 tablet by mouth daily., Until Discontinued, Historical Med    sodium chloride (MURO 128) 5 % ophthalmic ointment Place 1 application into both eyes at bedtime., Until Discontinued, Historical Med    VESICARE 5 MG tablet Take 1 tablet by mouth daily., Starting 09/03/2015, Until Discontinued, Historical Med    vitamin A 8000 UNIT capsule Take 8,000 Units by mouth daily., Until Discontinued, Historical Med    vitamin E 400 UNIT capsule Take 400 Units by mouth daily., Until Discontinued, Historical Med      STOP taking these medications     azithromycin (ZITHROMAX) 250 MG tablet          DISCHARGE INSTRUCTIONS:    DIET:  Low-fat, low-cholesterol diet  DISCHARGE CONDITION:  Good  ACTIVITY:  Activity as tolerated  OXYGEN:  Home Oxygen: No.   Oxygen Delivery: room air  DISCHARGE LOCATION:  home   If you experience worsening of your admission symptoms, develop shortness of breath, life threatening emergency, suicidal or homicidal thoughts  you must seek medical attention immediately by calling 911 or calling your MD immediately  if symptoms less severe.  You Must read complete instructions/literature along with all the possible adverse reactions/side effects for all the Medicines you take and that have been prescribed to you. Take any new Medicines after you have completely understood and accpet all the possible adverse reactions/side  effects.   Please note  You were cared for by a hospitalist during your hospital stay. If you have any questions about your discharge medications or the care you received while you were in the hospital after you are discharged, you can call the unit and asked to speak with the hospitalist on call if the hospitalist that took care of you is not available. Once you are discharged, your primary care physician will handle any further medical issues. Please note that NO REFILLS for any discharge medications will be authorized once you are discharged, as it is imperative that you return to your primary care physician (or establish a relationship with a primary care physician if you do not have one) for your aftercare needs so that they can reassess your need for medications and monitor your lab values.    On the day of Discharge:  VITAL SIGNS:  Blood pressure 135/64, pulse 77, temperature 98.3 F (36.8 C), temperature source Oral, resp. rate 14, height 5\' 9"  (1.753 m), weight 83.008 kg (183 lb), SpO2 99 %.  PHYSICAL EXAMINATION:  GENERAL:  80 y.o.-year-old patient lying in the bed with no acute distress.  EYES: Pupils equal, round, reactive to light and accommodation. No scleral icterus. Extraocular muscles intact.  HEENT: Head atraumatic, normocephalic. Oropharynx and nasopharynx clear.  NECK:  Supple, no jugular venous distention. No thyroid enlargement, no tenderness.  LUNGS: Normal breath sounds bilaterally, no wheezing, rales,rhonchi or crepitation. No use of accessory muscles of respiration.  CARDIOVASCULAR: S1, S2 normal. No murmurs, rubs, or gallops.  ABDOMEN: Soft, non-tender, non-distended. Bowel sounds present. No organomegaly or mass.  EXTREMITIES: No pedal edema, cyanosis, or clubbing.  NEUROLOGIC: Cranial nerves II through XII are intact. Muscle strength 5/5 in all extremities. Sensation intact. Gait not checked.  PSYCHIATRIC: The patient is alert and oriented x 3.  SKIN: No obvious  rash, lesion, or ulcer.  DATA REVIEW:   CBC  Recent Labs Lab 11/28/15 0533  WBC 5.9  HGB 12.1  HCT 36.3  PLT 185    Chemistries   Recent Labs Lab 11/27/15 2058 11/28/15 0533  NA 133* 140  K 4.3 4.1  CL 100* 108  CO2 27 28  GLUCOSE 105* 86  BUN 17 14  CREATININE 0.88 0.57  CALCIUM 8.8* 8.6*  MG 1.9  --   AST 22  --   ALT 18  --   ALKPHOS 54  --   BILITOT 0.3  --      Management plans discussed with the patient, family and they are in agreement.  CODE STATUS: Full code  TOTAL TIME TAKING CARE OF THIS PATIENT: 45 minutes.    [facility], Aithan Farrelly M.D on 12/01/2015 at 7:13 AM  Between 7am to 6pm - Pager - [phone]  After 6pm go to [email or web address] - password [email or web address] [facility]  Office  [phone]  CC: Primary care physician; Ebbie Ridge, MD   Note: This dictation was prepared with Dragon dictation along with smaller phrase technology. Any transcriptional errors that result from this process are unintentional.

## 2015-12-13 DIAGNOSIS — I6330 Cerebral infarction due to thrombosis of unspecified cerebral artery: Secondary | ICD-10-CM | POA: Insufficient documentation

## 2016-02-01 DIAGNOSIS — M1990 Unspecified osteoarthritis, unspecified site: Secondary | ICD-10-CM | POA: Insufficient documentation

## 2016-02-01 DIAGNOSIS — I10 Essential (primary) hypertension: Secondary | ICD-10-CM | POA: Insufficient documentation

## 2016-03-27 ENCOUNTER — Other Ambulatory Visit: Payer: Self-pay | Admitting: Infectious Diseases

## 2016-03-27 DIAGNOSIS — Z1231 Encounter for screening mammogram for malignant neoplasm of breast: Secondary | ICD-10-CM

## 2016-04-10 DIAGNOSIS — M76822 Posterior tibial tendinitis, left leg: Secondary | ICD-10-CM | POA: Insufficient documentation

## 2016-04-28 ENCOUNTER — Ambulatory Visit: Payer: Medicare Other

## 2016-05-22 HISTORY — PX: FOOT SURGERY: SHX648

## 2016-05-30 ENCOUNTER — Ambulatory Visit
Admission: RE | Admit: 2016-05-30 | Discharge: 2016-05-30 | Disposition: A | Discharge status: Home / Self Care | Payer: Medicare Other | Source: Ambulatory Visit | Attending: Infectious Diseases | Admitting: Infectious Diseases

## 2016-05-30 DIAGNOSIS — Z1231 Encounter for screening mammogram for malignant neoplasm of breast: Secondary | ICD-10-CM | POA: Diagnosis present

## 2016-06-02 ENCOUNTER — Ambulatory Visit: Payer: Medicare Other

## 2017-03-07 ENCOUNTER — Encounter
Admission: RE | Disposition: A | Discharge status: Home / Self Care | Payer: Self-pay | Source: Ambulatory Visit | Attending: Internal Medicine

## 2017-03-07 ENCOUNTER — Ambulatory Visit: Payer: Medicare Other | Admitting: Anesthesiology

## 2017-03-07 ENCOUNTER — Encounter: Payer: Self-pay | Admitting: *Deleted

## 2017-03-07 ENCOUNTER — Ambulatory Visit
Admission: RE | Admit: 2017-03-07 | Discharge: 2017-03-07 | Disposition: A | Discharge status: Home / Self Care | Payer: Medicare Other | Source: Ambulatory Visit | Attending: Internal Medicine | Admitting: Internal Medicine

## 2017-03-07 DIAGNOSIS — K640 First degree hemorrhoids: Secondary | ICD-10-CM | POA: Insufficient documentation

## 2017-03-07 DIAGNOSIS — K210 Gastro-esophageal reflux disease with esophagitis: Secondary | ICD-10-CM | POA: Diagnosis not present

## 2017-03-07 DIAGNOSIS — K449 Diaphragmatic hernia without obstruction or gangrene: Secondary | ICD-10-CM | POA: Diagnosis not present

## 2017-03-07 DIAGNOSIS — Z1211 Encounter for screening for malignant neoplasm of colon: Secondary | ICD-10-CM | POA: Diagnosis present

## 2017-03-07 DIAGNOSIS — Z79899 Other long term (current) drug therapy: Secondary | ICD-10-CM | POA: Insufficient documentation

## 2017-03-07 DIAGNOSIS — Z7982 Long term (current) use of aspirin: Secondary | ICD-10-CM | POA: Insufficient documentation

## 2017-03-07 DIAGNOSIS — Z885 Allergy status to narcotic agent status: Secondary | ICD-10-CM | POA: Diagnosis not present

## 2017-03-07 DIAGNOSIS — M1990 Unspecified osteoarthritis, unspecified site: Secondary | ICD-10-CM | POA: Diagnosis not present

## 2017-03-07 DIAGNOSIS — Q438 Other specified congenital malformations of intestine: Secondary | ICD-10-CM | POA: Insufficient documentation

## 2017-03-07 HISTORY — PX: ESOPHAGOGASTRODUODENOSCOPY: SHX5428

## 2017-03-07 HISTORY — DX: Essential (primary) hypertension: I10

## 2017-03-07 HISTORY — PX: COLONOSCOPY WITH PROPOFOL: SHX5780

## 2017-03-07 SURGERY — EGD (ESOPHAGOGASTRODUODENOSCOPY)
Anesthesia: General

## 2017-03-07 MED ORDER — LIDOCAINE HCL (PF) 1 % IJ SOLN
2.0000 mL | Freq: Once | INTRAMUSCULAR | Status: AC
  Administered 2017-03-07: 0.3 mL via INTRADERMAL

## 2017-03-07 MED ORDER — PROPOFOL 500 MG/50ML IV EMUL
INTRAVENOUS | Status: DC | PRN
Start: 2017-03-07 — End: 2017-03-07
  Administered 2017-03-07: 125 ug/kg/min via INTRAVENOUS

## 2017-03-07 MED ORDER — PROPOFOL 10 MG/ML IV BOLUS
INTRAVENOUS | Status: DC | PRN
  Administered 2017-03-07: 50 mg via INTRAVENOUS

## 2017-03-07 MED ORDER — PROPOFOL 500 MG/50ML IV EMUL
INTRAVENOUS | Status: AC
  Filled 2017-03-07: qty 50

## 2017-03-07 MED ORDER — SODIUM CHLORIDE 0.9 % IV SOLN
INTRAVENOUS | Status: DC
  Administered 2017-03-07: 1000 mL via INTRAVENOUS

## 2017-03-07 MED ORDER — LIDOCAINE HCL (CARDIAC) 20 MG/ML IV SOLN
INTRAVENOUS | Status: DC | PRN
  Administered 2017-03-07: 100 mg via INTRAVENOUS

## 2017-03-07 MED ORDER — LIDOCAINE HCL (PF) 1 % IJ SOLN
INTRAMUSCULAR | Status: AC
  Administered 2017-03-07: 0.3 mL via INTRADERMAL
  Filled 2017-03-07: qty 2

## 2017-03-07 MED ORDER — LIDOCAINE HCL (PF) 2 % IJ SOLN
INTRAMUSCULAR | Status: AC
  Filled 2017-03-07: qty 10

## 2017-03-07 NOTE — Anesthesia Postprocedure Evaluation (Signed)
Anesthesia Post Note  Patient: [name]  Procedure(s) Performed: ESOPHAGOGASTRODUODENOSCOPY (EGD) (N/A ) COLONOSCOPY WITH PROPOFOL (N/A )  Patient location during evaluation: Endoscopy Anesthesia Type: General Level of consciousness: awake and alert Pain management: pain level controlled Vital Signs Assessment: post-procedure vital signs reviewed and stable Respiratory status: spontaneous breathing and respiratory function stable Cardiovascular status: stable Anesthetic complications: no     Last Vitals:  Vitals:   03/07/17 1200 03/07/17 1210  BP: (!) 103/50 (!) 132/54  Pulse: 65 61  Resp: 17 18  Temp: (!) 35.8 C   SpO2: 99% 100%    Last Pain:  Vitals:   03/07/17 1200  TempSrc: Tympanic                 Chakira Jachim K

## 2017-03-07 NOTE — Op Note (Signed)
[facility] Patient Name: [name] Procedure Date: 03/07/2017 10:58 AM MRN: [id number] Account #: [id number] Date of Birth: December 07, 1935 Admit Type: Outpatient Age: 81 Room: [facility] ENDO ROOM 3 Gender: Female Note Status: Finalized Procedure:            Upper GI endoscopy Indications:          Esophageal reflux Providers:            Benay Pike. Alice Reichert MD, MD Referring MD:         Adrian Prows (Referring MD) Medicines:            Propofol per Anesthesia Complications:        No immediate complications. Procedure:            Pre-Anesthesia Assessment:                       - The risks and benefits of the procedure and the                        sedation options and risks were discussed with the                        patient. All questions were answered and informed                        consent was obtained.                       - Patient identification and proposed procedure were                        verified prior to the procedure by the nurse. The                        procedure was verified in the endoscopy suite.                       - ASA Grade Assessment: III - A patient with severe                        systemic disease.                       - After reviewing the risks and benefits, the patient                        was deemed in satisfactory condition to undergo the                        procedure.                       After obtaining informed consent, the endoscope was                        passed under direct vision. Throughout the procedure,                        the patient's blood pressure, pulse, and oxygen  saturations were monitored continuously. The Endoscope                        was introduced through the mouth, and advanced to the                        third part of duodenum. The upper GI endoscopy was                        accomplished without difficulty. The patient tolerated                     the procedure well. Findings:      The Z-line was irregular and was found 38 cm from the incisors. Mucosa       was biopsied with a cold forceps for histology. One specimen bottle was       sent to pathology.      A few localized, 3 mm non-bleeding erosions were found in the gastric       body and in the gastric antrum. There were no stigmata of recent       bleeding.      The examined duodenum was normal.      A small hiatal hernia was present.      J-shaped stomach Impression:           - Z-line irregular, 38 cm from the incisors. Biopsied.                       - Non-bleeding erosive gastropathy.                       - Normal examined duodenum.                       - Small hiatal hernia. Recommendation:       - Patient has a contact number available for                        emergencies. The signs and symptoms of potential                        delayed complications were discussed with the patient.                        Return to normal activities tomorrow. Written discharge                        instructions were provided to the patient.                       - Continue present medications.                       - Resume previous diet.                       - Await pathology results.                       - See the other procedure note for documentation of  additional recommendations. Procedure Code(s):    --- Professional ---                       [phone], Esophagogastroduodenoscopy, flexible, transoral;                        with biopsy, single or multiple Diagnosis Code(s):    --- Professional ---                       K22.8, Other specified diseases of esophagus                       K31.89, Other diseases of stomach and duodenum                       K44.9, Diaphragmatic hernia without obstruction or                        gangrene                       K21.9, Gastro-esophageal reflux disease without                         esophagitis CPT copyright 2016 American Medical Association. All rights reserved. The codes documented in this report are preliminary and upon coder review may  be revised to meet current compliance requirements. Efrain Sella MD, MD 03/07/2017 11:38:24 AM This report has been signed electronically. Number of Addenda: 0 Note Initiated On: 03/07/2017 10:58 AM      [facility]

## 2017-03-07 NOTE — Anesthesia Procedure Notes (Signed)
Date/Time: 03/07/2017 11:32 AM Performed by: Nelda Marseille Pre-anesthesia Checklist: Patient identified, Emergency Drugs available, Suction available, Patient being monitored and Timeout performed Oxygen Delivery Method: Nasal cannula

## 2017-03-07 NOTE — Anesthesia Preprocedure Evaluation (Signed)
Anesthesia Evaluation  Patient identified by MRN, date of birth, ID band Patient awake    Reviewed: Allergy & Precautions, NPO status , Patient's Chart, lab work & pertinent test results  History of Anesthesia Complications Negative for: history of anesthetic complications  Airway Mallampati: III       Dental   Pulmonary neg sleep apnea, neg COPD,           Cardiovascular (-) hypertension(-) Past MI and (-) CHF (-) dysrhythmias (-) Valvular Problems/Murmurs     Neuro/Psych neg Seizures    GI/Hepatic Neg liver ROS, GERD  Medicated and Controlled,  Endo/Other  neg diabetes  Renal/GU negative Renal ROS     Musculoskeletal   Abdominal   Peds  Hematology   Anesthesia Other Findings   Reproductive/Obstetrics                             Anesthesia Physical Anesthesia Plan  ASA: II  Anesthesia Plan: General   Post-op Pain Management:    Induction: Intravenous  PONV Risk Score and Plan: Propofol infusion  Airway Management Planned:   Additional Equipment:   Intra-op Plan:   Post-operative Plan:   Informed Consent: I have reviewed the patients History and Physical, chart, labs and discussed the procedure including the risks, benefits and alternatives for the proposed anesthesia with the patient or authorized representative who has indicated his/her understanding and acceptance.     Plan Discussed with:   Anesthesia Plan Comments:         Anesthesia Quick Evaluation

## 2017-03-07 NOTE — Anesthesia Post-op Follow-up Note (Signed)
Anesthesia QCDR form completed.        

## 2017-03-07 NOTE — Transfer of Care (Signed)
Immediate Anesthesia Transfer of Care Note  Patient: [name]  Procedure(s) Performed: ESOPHAGOGASTRODUODENOSCOPY (EGD) (N/A ) COLONOSCOPY WITH PROPOFOL (N/A )  Patient Location: PACU  Anesthesia Type:General  Level of Consciousness: sedated  Airway & Oxygen Therapy: Patient Spontanous Breathing and Patient connected to nasal cannula oxygen  Post-op Assessment: Report given to RN and Post -op Vital signs reviewed and stable  Post vital signs: Reviewed and stable  Last Vitals:  Vitals:   03/07/17 1100  BP: (!) 174/88  Pulse: 78  Resp: 17  Temp: (!) 35.9 C  SpO2: 100%    Last Pain:  Vitals:   03/07/17 1100  TempSrc: Tympanic         Complications: No apparent anesthesia complications

## 2017-03-07 NOTE — Op Note (Signed)
[facility] Patient Name: [name] Procedure Date: 03/07/2017 11:02 AM MRN: [id number] Account #: [id number] Date of Birth: 12-Apr-1936 Admit Type: Outpatient Age: 81 Room: [facility] ENDO ROOM 3 Gender: Female Note Status: Finalized Procedure:            Colonoscopy Indications:          Screening for colorectal malignant neoplasm Providers:            Benay Pike. Alice Reichert MD, MD Referring MD:         Adrian Prows (Referring MD) Medicines:            Propofol per Anesthesia Complications:        No immediate complications. Procedure:            Pre-Anesthesia Assessment:                       - ASA Grade Assessment: III - A patient with severe                        systemic disease.                       - After reviewing the risks and benefits, the patient                        was deemed in satisfactory condition to undergo the                        procedure.                       After obtaining informed consent, the colonoscope was                        passed under direct vision. Throughout the procedure,                        the patient's blood pressure, pulse, and oxygen                        saturations were monitored continuously. The                        Colonoscope was introduced through the anus and                        advanced to the the cecum, identified by appendiceal                        orifice and ileocecal valve. The colonoscopy was                        performed without difficulty. The patient tolerated the                        procedure well. The colonoscopy was performed without                        difficulty. The colonoscopy was somewhat difficult due  to a tortuous colon. The ileocecal valve, appendiceal                        orifice, and rectum were photographed. Findings:      The perianal and digital rectal examinations were normal. Pertinent       negatives include  normal sphincter tone and no palpable rectal lesions.      The colon (entire examined portion) appeared normal.      Non-bleeding internal hemorrhoids were found during retroflexion. The       hemorrhoids were Grade I (internal hemorrhoids that do not prolapse). Impression:           - The entire examined colon is normal.                       - Non-bleeding internal hemorrhoids.                       - No specimens collected. Recommendation:       - Patient has a contact number available for                        emergencies. The signs and symptoms of potential                        delayed complications were discussed with the patient.                        Return to normal activities tomorrow. Written discharge                        instructions were provided to the patient.                       - Resume previous diet.                       - Continue present medications.                       - No repeat colonoscopy due to age.                       - Return to GI office PRN. Diagnosis Code(s):    --- Professional ---                       Z12.11, Encounter for screening for malignant neoplasm                        of colon                       K64.0, First degree hemorrhoids Attending Participation:      I personally performed the entire procedure. Efrain Sella MD, MD 03/07/2017 12:04:08 PM This report has been signed electronically. Number of Addenda: 0 Note Initiated On: 03/07/2017 11:02 AM Scope Withdrawal Time: 0 hours 6 minutes 38 seconds  Total Procedure Duration: 0 hours 16 minutes 54 seconds       [facility]

## 2017-03-07 NOTE — H&P (Signed)
Outpatient short stay form Pre-procedure 03/07/2017 10:53 AM [name], M.D.  Primary Physician: Adrian Prows, M.D.  Reason for visit:  GERD, colon cancer screening  History of present illness:  81 y/o female presents with Chronic GERD/heartburn responsive to Nexium 40mg  po bid. Patient presents for colon cancer screening without significant change in bowel habits, weight loss or rectal bleeding.   No current facility-administered medications for this encounter.   Prescriptions Prior to Admission  Medication Sig Dispense Refill Last Dose  . ALPRAZolam (XANAX) 0.25 MG tablet Take 1 tablet by mouth 3 (three) times daily.   prn at Unknown time  . aspirin 325 MG EC tablet Take 1 tablet (325 mg total) by mouth daily. 30 tablet 0   . azelastine (ASTELIN) 0.1 % nasal spray Place 1 spray into both nostrils daily.   5 11/27/2015 at Unknown time  . Coenzyme Q10 (CO Q 10) 100 MG CAPS Take 100 mg by mouth daily.   11/27/2015 at Unknown time  . esomeprazole (NEXIUM) 40 MG capsule Take 1 capsule by mouth 2 (two) times daily.   11/27/2015 at Unknown time  . etodolac (LODINE) 400 MG tablet Take 1 tablet by mouth 2 (two) times daily.   11/27/2015 at Unknown time  . Multiple Vitamins-Minerals (MULTIVITAMIN WITH MINERALS) tablet Take 1 tablet by mouth daily.   11/27/2015 at Unknown time  . simvastatin (ZOCOR) 40 MG tablet Take 1 tablet (40 mg total) by mouth daily at 6 PM. 30 tablet 0   . sodium chloride (MURO 128) 5 % ophthalmic ointment Place 1 application into both eyes at bedtime.   11/26/2015 at Unknown time  . VESICARE 5 MG tablet Take 1 tablet by mouth daily.   11/27/2015 at Unknown time  . vitamin A 8000 UNIT capsule Take 8,000 Units by mouth daily.   11/27/2015 at Unknown time  . vitamin E 400 UNIT capsule Take 400 Units by mouth daily.   11/27/2015 at Unknown time     Allergies  Allergen Reactions  . Tramadol-Acetaminophen Nausea Only    Other reaction(s): Other (See Comments) AMS     Past  Medical History:  Diagnosis Date  . Arthritis   . GERD (gastroesophageal reflux disease)     Review of systems:      Physical Exam  General appearance: alert, cooperative and appears stated age Resp: clear to auscultation bilaterally Cardio: regular rate and rhythm, S1, S2 normal, no murmur, click, rub or gallop GI: soft, non-tender; bowel sounds normal; no masses,  no organomegaly     Planned procedures: Proceed with EGD and colonoscopy. The patient understands the nature of the planned procedure, indications, risks, alternatives and potential complications including but not limited to bleeding, infection, perforation, damage to internal organs and possible oversedation/side effects from anesthesia. The patient agrees and gives consent to proceed.  Please refer to procedure notes for findings, recommendations and patient disposition/instructions.    [name], M.D. Gastroenterology 03/07/2017  10:53 AM

## 2017-03-08 ENCOUNTER — Encounter: Payer: Self-pay | Admitting: Internal Medicine

## 2017-03-08 LAB — SURGICAL PATHOLOGY

## 2017-05-02 ENCOUNTER — Other Ambulatory Visit: Payer: Self-pay | Admitting: Infectious Diseases

## 2017-05-02 DIAGNOSIS — Z1231 Encounter for screening mammogram for malignant neoplasm of breast: Secondary | ICD-10-CM

## 2017-06-01 ENCOUNTER — Ambulatory Visit
Admission: RE | Admit: 2017-06-01 | Discharge: 2017-06-01 | Disposition: A | Discharge status: Home / Self Care | Payer: Medicare Other | Source: Ambulatory Visit | Attending: Infectious Diseases | Admitting: Infectious Diseases

## 2017-06-01 DIAGNOSIS — Z1231 Encounter for screening mammogram for malignant neoplasm of breast: Secondary | ICD-10-CM | POA: Insufficient documentation

## 2017-06-18 ENCOUNTER — Encounter: Admission: RE | Payer: Self-pay | Source: Ambulatory Visit

## 2017-06-18 ENCOUNTER — Ambulatory Visit: Admission: RE | Admit: 2017-06-18 | Payer: Medicare Other | Source: Ambulatory Visit | Admitting: Gastroenterology

## 2017-06-18 SURGERY — ESOPHAGOGASTRODUODENOSCOPY (EGD) WITH PROPOFOL
Anesthesia: General

## 2018-05-24 ENCOUNTER — Other Ambulatory Visit: Payer: Self-pay | Admitting: Internal Medicine

## 2018-05-24 DIAGNOSIS — Z1231 Encounter for screening mammogram for malignant neoplasm of breast: Secondary | ICD-10-CM

## 2018-06-12 ENCOUNTER — Ambulatory Visit
Admission: RE | Admit: 2018-06-12 | Discharge: 2018-06-12 | Disposition: A | Discharge status: Home / Self Care | Payer: Medicare Other | Source: Ambulatory Visit | Attending: Internal Medicine | Admitting: Internal Medicine

## 2018-06-12 DIAGNOSIS — Z1231 Encounter for screening mammogram for malignant neoplasm of breast: Secondary | ICD-10-CM | POA: Insufficient documentation

## 2018-06-23 DIAGNOSIS — M19072 Primary osteoarthritis, left ankle and foot: Secondary | ICD-10-CM | POA: Insufficient documentation

## 2018-07-18 DIAGNOSIS — R159 Full incontinence of feces: Secondary | ICD-10-CM | POA: Insufficient documentation

## 2018-09-19 DIAGNOSIS — Z9889 Other specified postprocedural states: Secondary | ICD-10-CM | POA: Insufficient documentation

## 2019-05-20 ENCOUNTER — Other Ambulatory Visit: Payer: Self-pay | Admitting: Infectious Diseases

## 2019-05-20 DIAGNOSIS — Z1231 Encounter for screening mammogram for malignant neoplasm of breast: Secondary | ICD-10-CM

## 2019-06-02 DIAGNOSIS — D229 Melanocytic nevi, unspecified: Secondary | ICD-10-CM | POA: Diagnosis not present

## 2019-06-02 DIAGNOSIS — L578 Other skin changes due to chronic exposure to nonionizing radiation: Secondary | ICD-10-CM | POA: Diagnosis not present

## 2019-06-02 DIAGNOSIS — L821 Other seborrheic keratosis: Secondary | ICD-10-CM | POA: Diagnosis not present

## 2019-06-02 DIAGNOSIS — L820 Inflamed seborrheic keratosis: Secondary | ICD-10-CM | POA: Diagnosis not present

## 2019-06-02 DIAGNOSIS — L570 Actinic keratosis: Secondary | ICD-10-CM | POA: Diagnosis not present

## 2019-06-02 DIAGNOSIS — D2230 Melanocytic nevi of unspecified part of face: Secondary | ICD-10-CM | POA: Diagnosis not present

## 2019-06-02 DIAGNOSIS — Z85828 Personal history of other malignant neoplasm of skin: Secondary | ICD-10-CM | POA: Diagnosis not present

## 2019-06-02 DIAGNOSIS — L853 Xerosis cutis: Secondary | ICD-10-CM | POA: Diagnosis not present

## 2019-06-02 DIAGNOSIS — Z1283 Encounter for screening for malignant neoplasm of skin: Secondary | ICD-10-CM | POA: Diagnosis not present

## 2019-06-02 DIAGNOSIS — D225 Melanocytic nevi of trunk: Secondary | ICD-10-CM | POA: Diagnosis not present

## 2019-06-02 DIAGNOSIS — L814 Other melanin hyperpigmentation: Secondary | ICD-10-CM | POA: Diagnosis not present

## 2019-06-16 ENCOUNTER — Ambulatory Visit
Admission: RE | Admit: 2019-06-16 | Discharge: 2019-06-16 | Disposition: A | Discharge status: Home / Self Care | Payer: PPO | Source: Ambulatory Visit | Attending: Infectious Diseases | Admitting: Infectious Diseases

## 2019-06-16 DIAGNOSIS — M9901 Segmental and somatic dysfunction of cervical region: Secondary | ICD-10-CM | POA: Diagnosis not present

## 2019-06-16 DIAGNOSIS — M5033 Other cervical disc degeneration, cervicothoracic region: Secondary | ICD-10-CM | POA: Diagnosis not present

## 2019-06-16 DIAGNOSIS — Z1231 Encounter for screening mammogram for malignant neoplasm of breast: Secondary | ICD-10-CM | POA: Insufficient documentation

## 2019-06-16 DIAGNOSIS — M955 Acquired deformity of pelvis: Secondary | ICD-10-CM | POA: Diagnosis not present

## 2019-06-16 DIAGNOSIS — M9905 Segmental and somatic dysfunction of pelvic region: Secondary | ICD-10-CM | POA: Diagnosis not present

## 2019-07-14 DIAGNOSIS — M9905 Segmental and somatic dysfunction of pelvic region: Secondary | ICD-10-CM | POA: Diagnosis not present

## 2019-07-14 DIAGNOSIS — M955 Acquired deformity of pelvis: Secondary | ICD-10-CM | POA: Diagnosis not present

## 2019-07-14 DIAGNOSIS — M9901 Segmental and somatic dysfunction of cervical region: Secondary | ICD-10-CM | POA: Diagnosis not present

## 2019-07-14 DIAGNOSIS — M5033 Other cervical disc degeneration, cervicothoracic region: Secondary | ICD-10-CM | POA: Diagnosis not present

## 2019-08-11 DIAGNOSIS — M955 Acquired deformity of pelvis: Secondary | ICD-10-CM | POA: Diagnosis not present

## 2019-08-11 DIAGNOSIS — M9901 Segmental and somatic dysfunction of cervical region: Secondary | ICD-10-CM | POA: Diagnosis not present

## 2019-08-11 DIAGNOSIS — M9905 Segmental and somatic dysfunction of pelvic region: Secondary | ICD-10-CM | POA: Diagnosis not present

## 2019-08-11 DIAGNOSIS — M5033 Other cervical disc degeneration, cervicothoracic region: Secondary | ICD-10-CM | POA: Diagnosis not present

## 2019-08-26 DIAGNOSIS — M170 Bilateral primary osteoarthritis of knee: Secondary | ICD-10-CM | POA: Diagnosis not present

## 2019-08-26 DIAGNOSIS — I739 Peripheral vascular disease, unspecified: Secondary | ICD-10-CM | POA: Diagnosis not present

## 2019-08-26 DIAGNOSIS — Z96653 Presence of artificial knee joint, bilateral: Secondary | ICD-10-CM | POA: Diagnosis not present

## 2019-09-08 DIAGNOSIS — M9901 Segmental and somatic dysfunction of cervical region: Secondary | ICD-10-CM | POA: Diagnosis not present

## 2019-09-08 DIAGNOSIS — M9905 Segmental and somatic dysfunction of pelvic region: Secondary | ICD-10-CM | POA: Diagnosis not present

## 2019-09-08 DIAGNOSIS — M5033 Other cervical disc degeneration, cervicothoracic region: Secondary | ICD-10-CM | POA: Diagnosis not present

## 2019-09-08 DIAGNOSIS — M955 Acquired deformity of pelvis: Secondary | ICD-10-CM | POA: Diagnosis not present

## 2019-09-19 DIAGNOSIS — K2100 Gastro-esophageal reflux disease with esophagitis, without bleeding: Secondary | ICD-10-CM | POA: Diagnosis not present

## 2019-09-19 DIAGNOSIS — E559 Vitamin D deficiency, unspecified: Secondary | ICD-10-CM | POA: Diagnosis not present

## 2019-09-19 DIAGNOSIS — I639 Cerebral infarction, unspecified: Secondary | ICD-10-CM | POA: Diagnosis not present

## 2019-09-19 DIAGNOSIS — I10 Essential (primary) hypertension: Secondary | ICD-10-CM | POA: Diagnosis not present

## 2019-09-19 DIAGNOSIS — I6330 Cerebral infarction due to thrombosis of unspecified cerebral artery: Secondary | ICD-10-CM | POA: Diagnosis not present

## 2019-09-26 DIAGNOSIS — M810 Age-related osteoporosis without current pathological fracture: Secondary | ICD-10-CM | POA: Diagnosis not present

## 2019-09-26 DIAGNOSIS — K2100 Gastro-esophageal reflux disease with esophagitis, without bleeding: Secondary | ICD-10-CM | POA: Diagnosis not present

## 2019-09-26 DIAGNOSIS — I6330 Cerebral infarction due to thrombosis of unspecified cerebral artery: Secondary | ICD-10-CM | POA: Diagnosis not present

## 2019-09-26 DIAGNOSIS — Z0000 Encounter for general adult medical examination without abnormal findings: Secondary | ICD-10-CM | POA: Diagnosis not present

## 2019-09-26 DIAGNOSIS — I10 Essential (primary) hypertension: Secondary | ICD-10-CM | POA: Diagnosis not present

## 2019-09-26 DIAGNOSIS — E7801 Familial hypercholesterolemia: Secondary | ICD-10-CM | POA: Diagnosis not present

## 2019-10-06 DIAGNOSIS — M955 Acquired deformity of pelvis: Secondary | ICD-10-CM | POA: Diagnosis not present

## 2019-10-06 DIAGNOSIS — M9901 Segmental and somatic dysfunction of cervical region: Secondary | ICD-10-CM | POA: Diagnosis not present

## 2019-10-06 DIAGNOSIS — M9905 Segmental and somatic dysfunction of pelvic region: Secondary | ICD-10-CM | POA: Diagnosis not present

## 2019-10-06 DIAGNOSIS — M5033 Other cervical disc degeneration, cervicothoracic region: Secondary | ICD-10-CM | POA: Diagnosis not present

## 2019-10-08 DIAGNOSIS — Z947 Corneal transplant status: Secondary | ICD-10-CM | POA: Diagnosis not present

## 2019-10-31 DIAGNOSIS — R42 Dizziness and giddiness: Secondary | ICD-10-CM | POA: Diagnosis not present

## 2019-10-31 DIAGNOSIS — I6330 Cerebral infarction due to thrombosis of unspecified cerebral artery: Secondary | ICD-10-CM | POA: Diagnosis not present

## 2019-10-31 DIAGNOSIS — H8113 Benign paroxysmal vertigo, bilateral: Secondary | ICD-10-CM | POA: Diagnosis not present

## 2019-10-31 DIAGNOSIS — I10 Essential (primary) hypertension: Secondary | ICD-10-CM | POA: Diagnosis not present

## 2019-11-05 DIAGNOSIS — H8112 Benign paroxysmal vertigo, left ear: Secondary | ICD-10-CM | POA: Diagnosis not present

## 2019-11-05 DIAGNOSIS — R42 Dizziness and giddiness: Secondary | ICD-10-CM | POA: Diagnosis not present

## 2019-11-10 DIAGNOSIS — M9905 Segmental and somatic dysfunction of pelvic region: Secondary | ICD-10-CM | POA: Diagnosis not present

## 2019-11-10 DIAGNOSIS — M955 Acquired deformity of pelvis: Secondary | ICD-10-CM | POA: Diagnosis not present

## 2019-11-10 DIAGNOSIS — M9901 Segmental and somatic dysfunction of cervical region: Secondary | ICD-10-CM | POA: Diagnosis not present

## 2019-11-10 DIAGNOSIS — M5033 Other cervical disc degeneration, cervicothoracic region: Secondary | ICD-10-CM | POA: Diagnosis not present

## 2019-12-17 DIAGNOSIS — M9905 Segmental and somatic dysfunction of pelvic region: Secondary | ICD-10-CM | POA: Diagnosis not present

## 2019-12-17 DIAGNOSIS — M9901 Segmental and somatic dysfunction of cervical region: Secondary | ICD-10-CM | POA: Diagnosis not present

## 2019-12-17 DIAGNOSIS — M955 Acquired deformity of pelvis: Secondary | ICD-10-CM | POA: Diagnosis not present

## 2019-12-17 DIAGNOSIS — M5033 Other cervical disc degeneration, cervicothoracic region: Secondary | ICD-10-CM | POA: Diagnosis not present

## 2020-01-21 DIAGNOSIS — M9901 Segmental and somatic dysfunction of cervical region: Secondary | ICD-10-CM | POA: Diagnosis not present

## 2020-01-21 DIAGNOSIS — M5033 Other cervical disc degeneration, cervicothoracic region: Secondary | ICD-10-CM | POA: Diagnosis not present

## 2020-01-21 DIAGNOSIS — M9905 Segmental and somatic dysfunction of pelvic region: Secondary | ICD-10-CM | POA: Diagnosis not present

## 2020-01-21 DIAGNOSIS — M955 Acquired deformity of pelvis: Secondary | ICD-10-CM | POA: Diagnosis not present

## 2020-03-03 DIAGNOSIS — M9905 Segmental and somatic dysfunction of pelvic region: Secondary | ICD-10-CM | POA: Diagnosis not present

## 2020-03-03 DIAGNOSIS — M9901 Segmental and somatic dysfunction of cervical region: Secondary | ICD-10-CM | POA: Diagnosis not present

## 2020-03-03 DIAGNOSIS — M955 Acquired deformity of pelvis: Secondary | ICD-10-CM | POA: Diagnosis not present

## 2020-03-03 DIAGNOSIS — M5033 Other cervical disc degeneration, cervicothoracic region: Secondary | ICD-10-CM | POA: Diagnosis not present

## 2020-03-22 DIAGNOSIS — M79675 Pain in left toe(s): Secondary | ICD-10-CM | POA: Diagnosis not present

## 2020-03-22 DIAGNOSIS — B351 Tinea unguium: Secondary | ICD-10-CM | POA: Diagnosis not present

## 2020-03-22 DIAGNOSIS — M79674 Pain in right toe(s): Secondary | ICD-10-CM | POA: Diagnosis not present

## 2020-03-25 DIAGNOSIS — I10 Essential (primary) hypertension: Secondary | ICD-10-CM | POA: Diagnosis not present

## 2020-03-25 DIAGNOSIS — I6330 Cerebral infarction due to thrombosis of unspecified cerebral artery: Secondary | ICD-10-CM | POA: Diagnosis not present

## 2020-03-30 DIAGNOSIS — M9905 Segmental and somatic dysfunction of pelvic region: Secondary | ICD-10-CM | POA: Diagnosis not present

## 2020-03-30 DIAGNOSIS — M5033 Other cervical disc degeneration, cervicothoracic region: Secondary | ICD-10-CM | POA: Diagnosis not present

## 2020-03-30 DIAGNOSIS — M955 Acquired deformity of pelvis: Secondary | ICD-10-CM | POA: Diagnosis not present

## 2020-03-30 DIAGNOSIS — M9901 Segmental and somatic dysfunction of cervical region: Secondary | ICD-10-CM | POA: Diagnosis not present

## 2020-04-01 DIAGNOSIS — E7801 Familial hypercholesterolemia: Secondary | ICD-10-CM | POA: Diagnosis not present

## 2020-04-01 DIAGNOSIS — I6330 Cerebral infarction due to thrombosis of unspecified cerebral artery: Secondary | ICD-10-CM | POA: Diagnosis not present

## 2020-04-01 DIAGNOSIS — M810 Age-related osteoporosis without current pathological fracture: Secondary | ICD-10-CM | POA: Diagnosis not present

## 2020-04-01 DIAGNOSIS — I639 Cerebral infarction, unspecified: Secondary | ICD-10-CM | POA: Diagnosis not present

## 2020-04-01 DIAGNOSIS — K2100 Gastro-esophageal reflux disease with esophagitis, without bleeding: Secondary | ICD-10-CM | POA: Diagnosis not present

## 2020-04-01 DIAGNOSIS — M1990 Unspecified osteoarthritis, unspecified site: Secondary | ICD-10-CM | POA: Diagnosis not present

## 2020-04-01 DIAGNOSIS — I10 Essential (primary) hypertension: Secondary | ICD-10-CM | POA: Diagnosis not present

## 2020-04-14 DIAGNOSIS — Z947 Corneal transplant status: Secondary | ICD-10-CM | POA: Diagnosis not present

## 2020-04-22 DIAGNOSIS — I10 Essential (primary) hypertension: Secondary | ICD-10-CM | POA: Diagnosis not present

## 2020-04-27 DIAGNOSIS — M9905 Segmental and somatic dysfunction of pelvic region: Secondary | ICD-10-CM | POA: Diagnosis not present

## 2020-04-27 DIAGNOSIS — M9901 Segmental and somatic dysfunction of cervical region: Secondary | ICD-10-CM | POA: Diagnosis not present

## 2020-04-27 DIAGNOSIS — M955 Acquired deformity of pelvis: Secondary | ICD-10-CM | POA: Diagnosis not present

## 2020-04-27 DIAGNOSIS — M5033 Other cervical disc degeneration, cervicothoracic region: Secondary | ICD-10-CM | POA: Diagnosis not present

## 2020-04-29 DIAGNOSIS — I6330 Cerebral infarction due to thrombosis of unspecified cerebral artery: Secondary | ICD-10-CM | POA: Diagnosis not present

## 2020-04-29 DIAGNOSIS — I10 Essential (primary) hypertension: Secondary | ICD-10-CM | POA: Diagnosis not present

## 2020-04-29 DIAGNOSIS — E7801 Familial hypercholesterolemia: Secondary | ICD-10-CM | POA: Diagnosis not present

## 2020-05-25 DIAGNOSIS — M5033 Other cervical disc degeneration, cervicothoracic region: Secondary | ICD-10-CM | POA: Diagnosis not present

## 2020-05-25 DIAGNOSIS — M955 Acquired deformity of pelvis: Secondary | ICD-10-CM | POA: Diagnosis not present

## 2020-05-25 DIAGNOSIS — M9905 Segmental and somatic dysfunction of pelvic region: Secondary | ICD-10-CM | POA: Diagnosis not present

## 2020-05-25 DIAGNOSIS — M9901 Segmental and somatic dysfunction of cervical region: Secondary | ICD-10-CM | POA: Diagnosis not present

## 2020-05-26 ENCOUNTER — Other Ambulatory Visit: Payer: Self-pay | Admitting: Infectious Diseases

## 2020-05-26 DIAGNOSIS — Z1231 Encounter for screening mammogram for malignant neoplasm of breast: Secondary | ICD-10-CM

## 2020-06-09 ENCOUNTER — Encounter: Payer: Self-pay | Admitting: Dermatology

## 2020-06-09 ENCOUNTER — Ambulatory Visit (INDEPENDENT_AMBULATORY_CARE_PROVIDER_SITE_OTHER): Payer: PPO | Admitting: Dermatology

## 2020-06-09 ENCOUNTER — Other Ambulatory Visit: Payer: Self-pay

## 2020-06-09 DIAGNOSIS — D229 Melanocytic nevi, unspecified: Secondary | ICD-10-CM

## 2020-06-09 DIAGNOSIS — L578 Other skin changes due to chronic exposure to nonionizing radiation: Secondary | ICD-10-CM | POA: Diagnosis not present

## 2020-06-09 DIAGNOSIS — D692 Other nonthrombocytopenic purpura: Secondary | ICD-10-CM | POA: Diagnosis not present

## 2020-06-09 DIAGNOSIS — D1800 Hemangioma unspecified site: Secondary | ICD-10-CM

## 2020-06-09 DIAGNOSIS — Z85828 Personal history of other malignant neoplasm of skin: Secondary | ICD-10-CM

## 2020-06-09 DIAGNOSIS — L820 Inflamed seborrheic keratosis: Secondary | ICD-10-CM

## 2020-06-09 DIAGNOSIS — L814 Other melanin hyperpigmentation: Secondary | ICD-10-CM | POA: Diagnosis not present

## 2020-06-09 DIAGNOSIS — L821 Other seborrheic keratosis: Secondary | ICD-10-CM

## 2020-06-09 DIAGNOSIS — L988 Other specified disorders of the skin and subcutaneous tissue: Secondary | ICD-10-CM

## 2020-06-09 DIAGNOSIS — L853 Xerosis cutis: Secondary | ICD-10-CM | POA: Diagnosis not present

## 2020-06-09 DIAGNOSIS — Z1283 Encounter for screening for malignant neoplasm of skin: Secondary | ICD-10-CM | POA: Diagnosis not present

## 2020-06-09 NOTE — Progress Notes (Unsigned)
Follow-Up Visit   Subjective  [name] is a 85 y.o. female who presents for the following: Total body skin exam (Hx of BCCs). The patient presents for Total-Body Skin Exam (TBSE) for skin cancer screening and mole check.  The following portions of the chart were reviewed this encounter and updated as appropriate:   Allergies  Meds  Problems  Med Hx  Surg Hx  Fam Hx     Review of Systems:  No other skin or systemic complaints except as noted in HPI or Assessment and Plan.  Objective  Well appearing patient in no apparent distress; mood and affect are within normal limits.  A full examination was performed including scalp, head, eyes, ears, nose, lips, neck, chest, axillae, abdomen, back, buttocks, bilateral upper extremities, bilateral lower extremities, hands, feet, fingers, toes, fingernails, and toenails. All findings within normal limits unless otherwise noted below.  Objective  Left parasternal chest, R sacral area, L temple: Well healed scars with no evidence of recurrence.   Objective  L arm x 1: Erythematous keratotic or waxy stuck-on papule or plaque.   Objective  Neck - Anterior: Rhytides and volume loss.    Assessment & Plan    Lentigines - Scattered tan macules - Discussed due to sun exposure - Benign, observe - Call for any changes  Seborrheic Keratoses - Stuck-on, waxy, tan-brown papules and plaques  - Discussed benign etiology and prognosis. - Observe - Call for any changes  Melanocytic Nevi - Tan-brown and/or pink-flesh-colored symmetric macules and papules - Benign appearing on exam today - Observation - Call clinic for new or changing moles - Recommend daily use of broad spectrum spf 30+ sunscreen to sun-exposed areas.   Hemangiomas - Red papules - Discussed benign nature - Observe - Call for any changes  Actinic Damage - Chronic, secondary to cumulative UV/sun exposure - diffuse scaly erythematous macules with underlying  dyspigmentation - Recommend daily broad spectrum sunscreen SPF 30+ to sun-exposed areas, reapply every 2 hours as needed.  - Call for new or changing lesions.  Skin cancer screening performed today.  Purpura - Chronic; persistent and recurrent.  Treatable, but not curable. - Violaceous macules and patches - Benign - Related to trauma, age, sun damage and/or use of blood thinners, chronic use of topical and/or oral steroids - Observe - Can use OTC arnica containing moisturizer such as Dermend Bruise Formula if desired - Call for worsening or other concerns  Xerosis - diffuse xerotic patches - recommend gentle, hydrating skin care - gentle skin care handout given - recommend Amlactin rapid relief, sample given  History of basal cell carcinoma (BCC) Left parasternal chest, R sacral area, L temple  Clear. Observe for recurrence. Call clinic for new or changing lesions.  Recommend regular skin exams, daily broad-spectrum spf 30+ sunscreen use, and photoprotection.     Inflamed seborrheic keratosis L arm x 1  Destruction of lesion - L arm x 1 Complexity: simple   Destruction method: cryotherapy   Informed consent: discussed and consent obtained   Timeout:  patient name, date of birth, surgical site, and procedure verified Lesion destroyed using liquid nitrogen: Yes   Region frozen until ice ball extended beyond lesion: Yes   Outcome: patient tolerated procedure well with no complications   Post-procedure details: wound care instructions given    Elastosis of skin Neck - Anterior  Recommend pt see a plastic surgeon for treatment options  Skin cancer screening  Return in about 1 year (around 06/09/2021)  for TBSE, Hx of BCC.   I, Othelia Pulling, RMA, am acting as scribe for Sarina Ser, MD .  Documentation: I have reviewed the above documentation for accuracy and completeness, and I agree with the above.  Sarina Ser, MD

## 2020-06-13 ENCOUNTER — Encounter: Payer: Self-pay | Admitting: Dermatology

## 2020-06-22 ENCOUNTER — Ambulatory Visit
Admission: RE | Admit: 2020-06-22 | Discharge: 2020-06-22 | Disposition: A | Discharge status: Home / Self Care | Payer: PPO | Source: Ambulatory Visit | Attending: Infectious Diseases | Admitting: Infectious Diseases

## 2020-06-22 ENCOUNTER — Other Ambulatory Visit: Payer: Self-pay

## 2020-06-22 DIAGNOSIS — Z1231 Encounter for screening mammogram for malignant neoplasm of breast: Secondary | ICD-10-CM

## 2020-06-30 DIAGNOSIS — M955 Acquired deformity of pelvis: Secondary | ICD-10-CM | POA: Diagnosis not present

## 2020-06-30 DIAGNOSIS — M9901 Segmental and somatic dysfunction of cervical region: Secondary | ICD-10-CM | POA: Diagnosis not present

## 2020-06-30 DIAGNOSIS — M5033 Other cervical disc degeneration, cervicothoracic region: Secondary | ICD-10-CM | POA: Diagnosis not present

## 2020-06-30 DIAGNOSIS — M9905 Segmental and somatic dysfunction of pelvic region: Secondary | ICD-10-CM | POA: Diagnosis not present

## 2020-08-04 DIAGNOSIS — M5033 Other cervical disc degeneration, cervicothoracic region: Secondary | ICD-10-CM | POA: Diagnosis not present

## 2020-08-04 DIAGNOSIS — M9901 Segmental and somatic dysfunction of cervical region: Secondary | ICD-10-CM | POA: Diagnosis not present

## 2020-08-04 DIAGNOSIS — M955 Acquired deformity of pelvis: Secondary | ICD-10-CM | POA: Diagnosis not present

## 2020-08-04 DIAGNOSIS — M9905 Segmental and somatic dysfunction of pelvic region: Secondary | ICD-10-CM | POA: Diagnosis not present

## 2020-08-24 DIAGNOSIS — Z96653 Presence of artificial knee joint, bilateral: Secondary | ICD-10-CM | POA: Diagnosis not present

## 2020-09-15 DIAGNOSIS — M9901 Segmental and somatic dysfunction of cervical region: Secondary | ICD-10-CM | POA: Diagnosis not present

## 2020-09-15 DIAGNOSIS — M9905 Segmental and somatic dysfunction of pelvic region: Secondary | ICD-10-CM | POA: Diagnosis not present

## 2020-09-15 DIAGNOSIS — M955 Acquired deformity of pelvis: Secondary | ICD-10-CM | POA: Diagnosis not present

## 2020-09-15 DIAGNOSIS — M5033 Other cervical disc degeneration, cervicothoracic region: Secondary | ICD-10-CM | POA: Diagnosis not present

## 2020-10-20 DIAGNOSIS — M5033 Other cervical disc degeneration, cervicothoracic region: Secondary | ICD-10-CM | POA: Diagnosis not present

## 2020-10-20 DIAGNOSIS — M955 Acquired deformity of pelvis: Secondary | ICD-10-CM | POA: Diagnosis not present

## 2020-10-20 DIAGNOSIS — Z947 Corneal transplant status: Secondary | ICD-10-CM | POA: Diagnosis not present

## 2020-10-20 DIAGNOSIS — M9901 Segmental and somatic dysfunction of cervical region: Secondary | ICD-10-CM | POA: Diagnosis not present

## 2020-10-20 DIAGNOSIS — M9905 Segmental and somatic dysfunction of pelvic region: Secondary | ICD-10-CM | POA: Diagnosis not present

## 2020-10-21 DIAGNOSIS — I10 Essential (primary) hypertension: Secondary | ICD-10-CM | POA: Diagnosis not present

## 2020-10-21 DIAGNOSIS — E7801 Familial hypercholesterolemia: Secondary | ICD-10-CM | POA: Diagnosis not present

## 2020-10-21 DIAGNOSIS — I6330 Cerebral infarction due to thrombosis of unspecified cerebral artery: Secondary | ICD-10-CM | POA: Diagnosis not present

## 2020-10-28 DIAGNOSIS — I6330 Cerebral infarction due to thrombosis of unspecified cerebral artery: Secondary | ICD-10-CM | POA: Diagnosis not present

## 2020-10-28 DIAGNOSIS — M1990 Unspecified osteoarthritis, unspecified site: Secondary | ICD-10-CM | POA: Diagnosis not present

## 2020-10-28 DIAGNOSIS — Z0000 Encounter for general adult medical examination without abnormal findings: Secondary | ICD-10-CM | POA: Diagnosis not present

## 2020-10-28 DIAGNOSIS — K2100 Gastro-esophageal reflux disease with esophagitis, without bleeding: Secondary | ICD-10-CM | POA: Diagnosis not present

## 2020-10-28 DIAGNOSIS — E7801 Familial hypercholesterolemia: Secondary | ICD-10-CM | POA: Diagnosis not present

## 2020-10-28 DIAGNOSIS — I10 Essential (primary) hypertension: Secondary | ICD-10-CM | POA: Diagnosis not present

## 2020-11-30 DIAGNOSIS — M9905 Segmental and somatic dysfunction of pelvic region: Secondary | ICD-10-CM | POA: Diagnosis not present

## 2020-11-30 DIAGNOSIS — M9901 Segmental and somatic dysfunction of cervical region: Secondary | ICD-10-CM | POA: Diagnosis not present

## 2020-11-30 DIAGNOSIS — M5033 Other cervical disc degeneration, cervicothoracic region: Secondary | ICD-10-CM | POA: Diagnosis not present

## 2020-11-30 DIAGNOSIS — M955 Acquired deformity of pelvis: Secondary | ICD-10-CM | POA: Diagnosis not present

## 2020-12-23 DIAGNOSIS — Z03818 Encounter for observation for suspected exposure to other biological agents ruled out: Secondary | ICD-10-CM | POA: Diagnosis not present

## 2020-12-23 DIAGNOSIS — Z20822 Contact with and (suspected) exposure to covid-19: Secondary | ICD-10-CM | POA: Diagnosis not present

## 2020-12-28 DIAGNOSIS — M955 Acquired deformity of pelvis: Secondary | ICD-10-CM | POA: Diagnosis not present

## 2020-12-28 DIAGNOSIS — M9901 Segmental and somatic dysfunction of cervical region: Secondary | ICD-10-CM | POA: Diagnosis not present

## 2020-12-28 DIAGNOSIS — M5033 Other cervical disc degeneration, cervicothoracic region: Secondary | ICD-10-CM | POA: Diagnosis not present

## 2020-12-28 DIAGNOSIS — M9905 Segmental and somatic dysfunction of pelvic region: Secondary | ICD-10-CM | POA: Diagnosis not present

## 2021-01-05 DIAGNOSIS — Z9889 Other specified postprocedural states: Secondary | ICD-10-CM | POA: Diagnosis not present

## 2021-01-05 DIAGNOSIS — M19012 Primary osteoarthritis, left shoulder: Secondary | ICD-10-CM | POA: Diagnosis not present

## 2021-01-05 DIAGNOSIS — S4992XA Unspecified injury of left shoulder and upper arm, initial encounter: Secondary | ICD-10-CM | POA: Diagnosis not present

## 2021-01-05 DIAGNOSIS — S79912A Unspecified injury of left hip, initial encounter: Secondary | ICD-10-CM | POA: Diagnosis not present

## 2021-01-12 DIAGNOSIS — S46012A Strain of muscle(s) and tendon(s) of the rotator cuff of left shoulder, initial encounter: Secondary | ICD-10-CM | POA: Diagnosis not present

## 2021-01-12 DIAGNOSIS — M7552 Bursitis of left shoulder: Secondary | ICD-10-CM | POA: Diagnosis not present

## 2021-01-12 DIAGNOSIS — M7542 Impingement syndrome of left shoulder: Secondary | ICD-10-CM | POA: Diagnosis not present

## 2021-01-12 DIAGNOSIS — M25512 Pain in left shoulder: Secondary | ICD-10-CM | POA: Diagnosis not present

## 2021-01-12 DIAGNOSIS — W010XXA Fall on same level from slipping, tripping and stumbling without subsequent striking against object, initial encounter: Secondary | ICD-10-CM | POA: Diagnosis not present

## 2021-01-13 ENCOUNTER — Other Ambulatory Visit (HOSPITAL_BASED_OUTPATIENT_CLINIC_OR_DEPARTMENT_OTHER): Payer: Self-pay | Admitting: Sports Medicine

## 2021-01-13 ENCOUNTER — Other Ambulatory Visit: Payer: Self-pay | Admitting: Sports Medicine

## 2021-01-13 DIAGNOSIS — M25512 Pain in left shoulder: Secondary | ICD-10-CM

## 2021-01-13 DIAGNOSIS — M7552 Bursitis of left shoulder: Secondary | ICD-10-CM

## 2021-01-13 DIAGNOSIS — W19XXXA Unspecified fall, initial encounter: Secondary | ICD-10-CM

## 2021-01-13 DIAGNOSIS — S46012A Strain of muscle(s) and tendon(s) of the rotator cuff of left shoulder, initial encounter: Secondary | ICD-10-CM

## 2021-01-15 ENCOUNTER — Ambulatory Visit
Admission: RE | Admit: 2021-01-15 | Discharge: 2021-01-15 | Disposition: A | Discharge status: Home / Self Care | Payer: PPO | Source: Ambulatory Visit | Attending: Sports Medicine | Admitting: Sports Medicine

## 2021-01-15 ENCOUNTER — Other Ambulatory Visit: Payer: Self-pay

## 2021-01-15 DIAGNOSIS — M7552 Bursitis of left shoulder: Secondary | ICD-10-CM | POA: Diagnosis not present

## 2021-01-15 DIAGNOSIS — S46012A Strain of muscle(s) and tendon(s) of the rotator cuff of left shoulder, initial encounter: Secondary | ICD-10-CM

## 2021-01-15 DIAGNOSIS — Y939 Activity, unspecified: Secondary | ICD-10-CM | POA: Insufficient documentation

## 2021-01-15 DIAGNOSIS — M75122 Complete rotator cuff tear or rupture of left shoulder, not specified as traumatic: Secondary | ICD-10-CM | POA: Diagnosis not present

## 2021-01-15 DIAGNOSIS — W19XXXA Unspecified fall, initial encounter: Secondary | ICD-10-CM | POA: Insufficient documentation

## 2021-01-15 DIAGNOSIS — M19012 Primary osteoarthritis, left shoulder: Secondary | ICD-10-CM | POA: Insufficient documentation

## 2021-01-15 DIAGNOSIS — M25512 Pain in left shoulder: Secondary | ICD-10-CM | POA: Diagnosis not present

## 2021-01-15 DIAGNOSIS — R609 Edema, unspecified: Secondary | ICD-10-CM | POA: Insufficient documentation

## 2021-01-15 DIAGNOSIS — M25412 Effusion, left shoulder: Secondary | ICD-10-CM | POA: Diagnosis not present

## 2021-01-15 DIAGNOSIS — Y929 Unspecified place or not applicable: Secondary | ICD-10-CM | POA: Insufficient documentation

## 2021-01-25 DIAGNOSIS — M25512 Pain in left shoulder: Secondary | ICD-10-CM | POA: Diagnosis not present

## 2021-01-25 DIAGNOSIS — G8929 Other chronic pain: Secondary | ICD-10-CM | POA: Diagnosis not present

## 2021-01-25 DIAGNOSIS — M19012 Primary osteoarthritis, left shoulder: Secondary | ICD-10-CM | POA: Diagnosis not present

## 2021-01-25 DIAGNOSIS — S46012A Strain of muscle(s) and tendon(s) of the rotator cuff of left shoulder, initial encounter: Secondary | ICD-10-CM | POA: Diagnosis not present

## 2021-02-11 DIAGNOSIS — M75102 Unspecified rotator cuff tear or rupture of left shoulder, not specified as traumatic: Secondary | ICD-10-CM | POA: Diagnosis not present

## 2021-02-11 DIAGNOSIS — Z9049 Acquired absence of other specified parts of digestive tract: Secondary | ICD-10-CM | POA: Diagnosis not present

## 2021-02-11 DIAGNOSIS — I10 Essential (primary) hypertension: Secondary | ICD-10-CM | POA: Diagnosis not present

## 2021-02-11 DIAGNOSIS — Z7982 Long term (current) use of aspirin: Secondary | ICD-10-CM | POA: Diagnosis not present

## 2021-02-11 DIAGNOSIS — K219 Gastro-esophageal reflux disease without esophagitis: Secondary | ICD-10-CM | POA: Diagnosis not present

## 2021-02-11 DIAGNOSIS — M75122 Complete rotator cuff tear or rupture of left shoulder, not specified as traumatic: Secondary | ICD-10-CM | POA: Diagnosis not present

## 2021-02-11 DIAGNOSIS — G8918 Other acute postprocedural pain: Secondary | ICD-10-CM | POA: Diagnosis not present

## 2021-02-11 DIAGNOSIS — M25512 Pain in left shoulder: Secondary | ICD-10-CM | POA: Diagnosis not present

## 2021-02-11 DIAGNOSIS — M25511 Pain in right shoulder: Secondary | ICD-10-CM | POA: Diagnosis not present

## 2021-02-11 DIAGNOSIS — S46012A Strain of muscle(s) and tendon(s) of the rotator cuff of left shoulder, initial encounter: Secondary | ICD-10-CM | POA: Diagnosis not present

## 2021-02-11 DIAGNOSIS — Z79899 Other long term (current) drug therapy: Secondary | ICD-10-CM | POA: Diagnosis not present

## 2021-02-11 DIAGNOSIS — M19019 Primary osteoarthritis, unspecified shoulder: Secondary | ICD-10-CM | POA: Diagnosis not present

## 2021-02-11 DIAGNOSIS — Z8673 Personal history of transient ischemic attack (TIA), and cerebral infarction without residual deficits: Secondary | ICD-10-CM | POA: Diagnosis not present

## 2021-02-11 DIAGNOSIS — G8929 Other chronic pain: Secondary | ICD-10-CM | POA: Diagnosis not present

## 2021-02-22 ENCOUNTER — Other Ambulatory Visit: Payer: Self-pay

## 2021-02-22 ENCOUNTER — Ambulatory Visit (INDEPENDENT_AMBULATORY_CARE_PROVIDER_SITE_OTHER): Payer: PPO | Admitting: Dermatology

## 2021-02-22 ENCOUNTER — Encounter: Payer: Self-pay | Admitting: Dermatology

## 2021-02-22 DIAGNOSIS — L219 Seborrheic dermatitis, unspecified: Secondary | ICD-10-CM | POA: Diagnosis not present

## 2021-02-22 MED ORDER — CLOBETASOL PROPIONATE 0.05 % EX SHAM
MEDICATED_SHAMPOO | CUTANEOUS | 2 refills | Status: DC
Start: 2021-02-22 — End: 2021-02-24

## 2021-02-22 MED ORDER — KETOCONAZOLE 2 % EX SHAM: MEDICATED_SHAMPOO | CUTANEOUS | 2 refills | Status: DC

## 2021-02-22 NOTE — Progress Notes (Signed)
   Follow-Up Visit   Subjective  [name] is a 85 y.o. female who presents for the following: itchy scalp (Patient hasn't noticed scale but her scalp is very itchy. She is concerned she may have psoriasis but hasn't been diagnosed with that in the past.).  The following portions of the chart were reviewed this encounter and updated as appropriate:   Allergies  Meds  Problems  Med Hx  Surg Hx  Fam Hx     Review of Systems:  No other skin or systemic complaints except as noted in HPI or Assessment and Plan.  Objective  Well appearing patient in no apparent distress; mood and affect are within normal limits.  A focused examination was performed including face,scalp. Relevant physical exam findings are noted in the Assessment and Plan.  Scalp Pink patches with greasy scale.    Assessment & Plan  Seborrheic dermatitis with pruritus Scalp Seborrheic Dermatitis with Pruritus  -  is a chronic persistent rash characterized by pinkness and scaling most commonly of the mid face but also can occur on the scalp (dandruff), ears; mid chest, mid back and groin.  It tends to be exacerbated by stress and cooler weather.  People who have neurologic disease may experience new onset or exacerbation of existing seborrheic dermatitis.  The condition is not curable but treatable and can be controlled.   Start Ketoconazole shampoo apply three times per week, massage into scalp and leave in for 10 minutes before rinsing out   Start Clobetasol shampoo apply three times per week, massage into scalp and leave in for 15 minutes before rinsing out   Related Medications ketoconazole (NIZORAL) 2 % shampoo apply once  per week, massage into scalp and leave in for 10 minutes before rinsing out  Clobetasol Propionate 0.05 % shampoo apply once per week, massage into scalp and leave in for 15 minutes before rinsing out  Return in about 6 weeks (around 04/05/2021) for Seborrheic dermatitis .  IMarye Round, CMA, am acting as scribe for Sarina Ser, MD .  Documentation: I have reviewed the above documentation for accuracy and completeness, and I agree with the above.  Sarina Ser, MD

## 2021-02-22 NOTE — Patient Instructions (Addendum)
Start Ketoconazole shampoo apply once per week, massage into scalp and leave in for 10 minutes before rinsing out   Start Clobetasol shampoo apply once per week, massage into scalp and leave in for 15 minutes before rinsing out    If you have any questions or concerns for your doctor, please call our main line at [phone] and press option 4 to reach your doctor's medical assistant. If no one answers, please leave a voicemail as directed and we will return your call as soon as possible. Messages left after 4 pm will be answered the following business day.   You may also send [place] a message via [place]. We typically respond to MyChart messages within 1-2 business days.  For prescription refills, please ask your pharmacy to contact our office. Our fax number is [phone].  If you have an urgent issue when the clinic is closed that cannot wait until the next business day, you can page your doctor at the number below.    Please note that while we do our best to be available for urgent issues outside of office hours, we are not available 24/7.   If you have an urgent issue and are unable to reach [place], you may choose to seek medical care at your doctor's office, retail clinic, urgent care center, or emergency room.  If you have a medical emergency, please immediately call 911 or go to the emergency department.  Pager Numbers  - Dr. Nehemiah Massed: [phone]  - Dr. Laurence Ferrari: [phone]  - Dr. Nicole Kindred: [phone]  In the event of inclement weather, please call our main line at [phone] for an update on the status of any delays or closures.  Dermatology Medication Tips: Please keep the boxes that topical medications come in in order to help keep track of the instructions about where and how to use these. Pharmacies typically print the medication instructions only on the boxes and not directly on the medication tubes.   If your medication is too expensive, please contact our office at  [phone] option 4 or send [place] a message through [place].   We are unable to tell what your co-pay for medications will be in advance as this is different depending on your insurance coverage. However, we may be able to find a substitute medication at lower cost or fill out paperwork to get insurance to cover a needed medication.   If a prior authorization is required to get your medication covered by your insurance company, please allow [place] 1-2 business days to complete this process.  Drug prices often vary depending on where the prescription is filled and some pharmacies may offer cheaper prices.  The website www.goodrx.com contains coupons for medications through different pharmacies. The prices here do not account for what the cost may be with help from insurance (it may be cheaper with your insurance), but the website can give you the price if you did not use any insurance.  - You can print the associated coupon and take it with your prescription to the pharmacy.  - You may also stop by our office during regular business hours and pick up a GoodRx coupon card.  - If you need your prescription sent electronically to a different pharmacy, notify our office through [facility] or by phone at [phone] option 4.

## 2021-02-23 ENCOUNTER — Telehealth: Payer: Self-pay

## 2021-02-23 NOTE — Telephone Encounter (Signed)
Patient called to let us know the Clobetasol will cost her $95 with insurance and she can not afford that at this time.

## 2021-02-24 MED ORDER — CLOBETASOL PROPIONATE 0.05 % EX SOLN: 1.0000 "application " | CUTANEOUS | 0 refills | Status: DC

## 2021-02-24 NOTE — Telephone Encounter (Signed)
Patient advised of medication change and RX sent in.

## 2021-02-26 DIAGNOSIS — M899 Disorder of bone, unspecified: Secondary | ICD-10-CM | POA: Diagnosis not present

## 2021-02-26 DIAGNOSIS — R29898 Other symptoms and signs involving the musculoskeletal system: Secondary | ICD-10-CM | POA: Diagnosis not present

## 2021-03-03 DIAGNOSIS — G8918 Other acute postprocedural pain: Secondary | ICD-10-CM | POA: Diagnosis not present

## 2021-03-03 DIAGNOSIS — M899 Disorder of bone, unspecified: Secondary | ICD-10-CM | POA: Diagnosis not present

## 2021-03-03 DIAGNOSIS — R29898 Other symptoms and signs involving the musculoskeletal system: Secondary | ICD-10-CM | POA: Diagnosis not present

## 2021-03-03 DIAGNOSIS — M25512 Pain in left shoulder: Secondary | ICD-10-CM | POA: Diagnosis not present

## 2021-03-05 DIAGNOSIS — M899 Disorder of bone, unspecified: Secondary | ICD-10-CM | POA: Diagnosis not present

## 2021-03-05 DIAGNOSIS — G8918 Other acute postprocedural pain: Secondary | ICD-10-CM | POA: Diagnosis not present

## 2021-03-05 DIAGNOSIS — M25512 Pain in left shoulder: Secondary | ICD-10-CM | POA: Diagnosis not present

## 2021-03-05 DIAGNOSIS — R29898 Other symptoms and signs involving the musculoskeletal system: Secondary | ICD-10-CM | POA: Diagnosis not present

## 2021-03-10 DIAGNOSIS — M25512 Pain in left shoulder: Secondary | ICD-10-CM | POA: Diagnosis not present

## 2021-03-10 DIAGNOSIS — M899 Disorder of bone, unspecified: Secondary | ICD-10-CM | POA: Diagnosis not present

## 2021-03-10 DIAGNOSIS — G8918 Other acute postprocedural pain: Secondary | ICD-10-CM | POA: Diagnosis not present

## 2021-03-10 DIAGNOSIS — R29898 Other symptoms and signs involving the musculoskeletal system: Secondary | ICD-10-CM | POA: Diagnosis not present

## 2021-03-12 DIAGNOSIS — R29898 Other symptoms and signs involving the musculoskeletal system: Secondary | ICD-10-CM | POA: Diagnosis not present

## 2021-03-12 DIAGNOSIS — G8918 Other acute postprocedural pain: Secondary | ICD-10-CM | POA: Diagnosis not present

## 2021-03-12 DIAGNOSIS — M899 Disorder of bone, unspecified: Secondary | ICD-10-CM | POA: Diagnosis not present

## 2021-03-12 DIAGNOSIS — M25512 Pain in left shoulder: Secondary | ICD-10-CM | POA: Diagnosis not present

## 2021-03-17 DIAGNOSIS — M899 Disorder of bone, unspecified: Secondary | ICD-10-CM | POA: Diagnosis not present

## 2021-03-17 DIAGNOSIS — G8918 Other acute postprocedural pain: Secondary | ICD-10-CM | POA: Diagnosis not present

## 2021-03-17 DIAGNOSIS — R29898 Other symptoms and signs involving the musculoskeletal system: Secondary | ICD-10-CM | POA: Diagnosis not present

## 2021-03-17 DIAGNOSIS — M25512 Pain in left shoulder: Secondary | ICD-10-CM | POA: Diagnosis not present

## 2021-03-19 DIAGNOSIS — M25512 Pain in left shoulder: Secondary | ICD-10-CM | POA: Diagnosis not present

## 2021-03-19 DIAGNOSIS — R29898 Other symptoms and signs involving the musculoskeletal system: Secondary | ICD-10-CM | POA: Diagnosis not present

## 2021-03-19 DIAGNOSIS — M899 Disorder of bone, unspecified: Secondary | ICD-10-CM | POA: Diagnosis not present

## 2021-03-19 DIAGNOSIS — G8918 Other acute postprocedural pain: Secondary | ICD-10-CM | POA: Diagnosis not present

## 2021-03-23 DIAGNOSIS — M899 Disorder of bone, unspecified: Secondary | ICD-10-CM | POA: Diagnosis not present

## 2021-03-23 DIAGNOSIS — R29898 Other symptoms and signs involving the musculoskeletal system: Secondary | ICD-10-CM | POA: Diagnosis not present

## 2021-03-23 DIAGNOSIS — G8918 Other acute postprocedural pain: Secondary | ICD-10-CM | POA: Diagnosis not present

## 2021-03-23 DIAGNOSIS — M25512 Pain in left shoulder: Secondary | ICD-10-CM | POA: Diagnosis not present

## 2021-03-29 DIAGNOSIS — G8918 Other acute postprocedural pain: Secondary | ICD-10-CM | POA: Diagnosis not present

## 2021-03-29 DIAGNOSIS — M25512 Pain in left shoulder: Secondary | ICD-10-CM | POA: Diagnosis not present

## 2021-03-29 DIAGNOSIS — R29898 Other symptoms and signs involving the musculoskeletal system: Secondary | ICD-10-CM | POA: Diagnosis not present

## 2021-03-29 DIAGNOSIS — M899 Disorder of bone, unspecified: Secondary | ICD-10-CM | POA: Diagnosis not present

## 2021-03-31 DIAGNOSIS — M25512 Pain in left shoulder: Secondary | ICD-10-CM | POA: Diagnosis not present

## 2021-03-31 DIAGNOSIS — R29898 Other symptoms and signs involving the musculoskeletal system: Secondary | ICD-10-CM | POA: Diagnosis not present

## 2021-03-31 DIAGNOSIS — G8918 Other acute postprocedural pain: Secondary | ICD-10-CM | POA: Diagnosis not present

## 2021-03-31 DIAGNOSIS — M899 Disorder of bone, unspecified: Secondary | ICD-10-CM | POA: Diagnosis not present

## 2021-04-06 DIAGNOSIS — G8918 Other acute postprocedural pain: Secondary | ICD-10-CM | POA: Diagnosis not present

## 2021-04-06 DIAGNOSIS — R29898 Other symptoms and signs involving the musculoskeletal system: Secondary | ICD-10-CM | POA: Diagnosis not present

## 2021-04-06 DIAGNOSIS — M899 Disorder of bone, unspecified: Secondary | ICD-10-CM | POA: Diagnosis not present

## 2021-04-06 DIAGNOSIS — M25512 Pain in left shoulder: Secondary | ICD-10-CM | POA: Diagnosis not present

## 2021-04-08 DIAGNOSIS — M25512 Pain in left shoulder: Secondary | ICD-10-CM | POA: Diagnosis not present

## 2021-04-08 DIAGNOSIS — G8918 Other acute postprocedural pain: Secondary | ICD-10-CM | POA: Diagnosis not present

## 2021-04-08 DIAGNOSIS — M899 Disorder of bone, unspecified: Secondary | ICD-10-CM | POA: Diagnosis not present

## 2021-04-08 DIAGNOSIS — R29898 Other symptoms and signs involving the musculoskeletal system: Secondary | ICD-10-CM | POA: Diagnosis not present

## 2021-04-11 ENCOUNTER — Other Ambulatory Visit: Payer: Self-pay

## 2021-04-11 ENCOUNTER — Ambulatory Visit (INDEPENDENT_AMBULATORY_CARE_PROVIDER_SITE_OTHER): Payer: PPO | Admitting: Dermatology

## 2021-04-11 DIAGNOSIS — L219 Seborrheic dermatitis, unspecified: Secondary | ICD-10-CM

## 2021-04-11 DIAGNOSIS — L820 Inflamed seborrheic keratosis: Secondary | ICD-10-CM | POA: Diagnosis not present

## 2021-04-11 DIAGNOSIS — L578 Other skin changes due to chronic exposure to nonionizing radiation: Secondary | ICD-10-CM | POA: Diagnosis not present

## 2021-04-11 DIAGNOSIS — L570 Actinic keratosis: Secondary | ICD-10-CM

## 2021-04-11 NOTE — Patient Instructions (Signed)

## 2021-04-11 NOTE — Progress Notes (Signed)
Follow-Up Visit   Subjective  [name] is a 85 y.o. female who presents for the following: Dermatitis (6 weeks f/u seborrheic dermatitis on the scalp, treating Clobetasol shampoo alternating with Ketoconazole shampoo with a good response ). Check irritated growths on her face.    The following portions of the chart were reviewed this encounter and updated as appropriate:   Tobacco  Allergies  Meds  Problems  Med Hx  Surg Hx  Fam Hx     Review of Systems:  No other skin or systemic complaints except as noted in HPI or Assessment and Plan.  Objective  Well appearing patient in no apparent distress; mood and affect are within normal limits.  A focused examination was performed including scalp, face, neck . Relevant physical exam findings are noted in the Assessment and Plan.  scalp Clear scalp   left lateral neck under earlobe x 1 Erythematous keratotic or waxy stuck-on papule or plaque.   left forehead x 1 Erythematous thin papules/macules with gritty scale.    Assessment & Plan  Seborrheic dermatitis scalp Improved on current treatment but persistent and not to goal. Seborrheic Dermatitis  -  is a chronic persistent rash characterized by pinkness and scaling most commonly of the mid face but also can occur on the scalp (dandruff), ears; mid chest, mid back and groin.  It tends to be exacerbated by stress and cooler weather.  People who have neurologic disease may experience new onset or exacerbation of existing seborrheic dermatitis.  The condition is not curable but treatable and can be controlled.   Cont Ketoconazole shampoo as directed  Cont Clobetasol shampoo as directed   Related Medications ketoconazole (NIZORAL) 2 % shampoo apply once  per week, massage into scalp and leave in for 10 minutes before rinsing out  Inflamed seborrheic keratosis left lateral neck under earlobe x 1  Reassured benign age-related growth.  Recommend observation.  Discussed  cryotherapy if spot(s) become irritated or inflamed.   Destruction of lesion - left lateral neck under earlobe x 1 Complexity: simple   Destruction method: cryotherapy   Informed consent: discussed and consent obtained   Timeout:  patient name, date of birth, surgical site, and procedure verified Lesion destroyed using liquid nitrogen: Yes   Region frozen until ice ball extended beyond lesion: Yes   Outcome: patient tolerated procedure well with no complications   Post-procedure details: wound care instructions given    AK (actinic keratosis) left forehead x 1  Destruction of lesion - left forehead x 1 Complexity: simple   Destruction method: cryotherapy   Informed consent: discussed and consent obtained   Timeout:  patient name, date of birth, surgical site, and procedure verified Lesion destroyed using liquid nitrogen: Yes   Region frozen until ice ball extended beyond lesion: Yes   Outcome: patient tolerated procedure well with no complications   Post-procedure details: wound care instructions given    Actinic Damage - chronic, secondary to cumulative UV radiation exposure/sun exposure over time - diffuse scaly erythematous macules with underlying dyspigmentation - Recommend daily broad spectrum sunscreen SPF 30+ to sun-exposed areas, reapply every 2 hours as needed.  - Recommend staying in the shade or wearing long sleeves, sun glasses (UVA+UVB protection) and wide brim hats (4-inch brim around the entire circumference of the hat). - Call for new or changing lesions.  Return for TBSE as scheduled Jun 09, 2021.  IMarye Round, CMA, am acting as scribe for Sarina Ser, MD .  Documentation: I have reviewed the above documentation for accuracy and completeness, and I agree with the above.  Sarina Ser, MD

## 2021-04-12 DIAGNOSIS — M899 Disorder of bone, unspecified: Secondary | ICD-10-CM | POA: Diagnosis not present

## 2021-04-12 DIAGNOSIS — R29898 Other symptoms and signs involving the musculoskeletal system: Secondary | ICD-10-CM | POA: Diagnosis not present

## 2021-04-12 DIAGNOSIS — M25512 Pain in left shoulder: Secondary | ICD-10-CM | POA: Diagnosis not present

## 2021-04-12 DIAGNOSIS — G8918 Other acute postprocedural pain: Secondary | ICD-10-CM | POA: Diagnosis not present

## 2021-04-19 DIAGNOSIS — M25512 Pain in left shoulder: Secondary | ICD-10-CM | POA: Diagnosis not present

## 2021-04-19 DIAGNOSIS — G8918 Other acute postprocedural pain: Secondary | ICD-10-CM | POA: Diagnosis not present

## 2021-04-19 DIAGNOSIS — M899 Disorder of bone, unspecified: Secondary | ICD-10-CM | POA: Diagnosis not present

## 2021-04-19 DIAGNOSIS — R29898 Other symptoms and signs involving the musculoskeletal system: Secondary | ICD-10-CM | POA: Diagnosis not present

## 2021-04-21 DIAGNOSIS — M899 Disorder of bone, unspecified: Secondary | ICD-10-CM | POA: Diagnosis not present

## 2021-04-21 DIAGNOSIS — R29898 Other symptoms and signs involving the musculoskeletal system: Secondary | ICD-10-CM | POA: Diagnosis not present

## 2021-04-25 ENCOUNTER — Encounter: Payer: Self-pay | Admitting: Dermatology

## 2021-04-26 DIAGNOSIS — M899 Disorder of bone, unspecified: Secondary | ICD-10-CM | POA: Diagnosis not present

## 2021-04-26 DIAGNOSIS — R29898 Other symptoms and signs involving the musculoskeletal system: Secondary | ICD-10-CM | POA: Diagnosis not present

## 2021-04-26 DIAGNOSIS — I10 Essential (primary) hypertension: Secondary | ICD-10-CM | POA: Diagnosis not present

## 2021-04-26 DIAGNOSIS — E7801 Familial hypercholesterolemia: Secondary | ICD-10-CM | POA: Diagnosis not present

## 2021-04-26 DIAGNOSIS — I6330 Cerebral infarction due to thrombosis of unspecified cerebral artery: Secondary | ICD-10-CM | POA: Diagnosis not present

## 2021-05-03 DIAGNOSIS — N3941 Urge incontinence: Secondary | ICD-10-CM | POA: Diagnosis not present

## 2021-05-03 DIAGNOSIS — R29898 Other symptoms and signs involving the musculoskeletal system: Secondary | ICD-10-CM | POA: Diagnosis not present

## 2021-05-03 DIAGNOSIS — M1990 Unspecified osteoarthritis, unspecified site: Secondary | ICD-10-CM | POA: Diagnosis not present

## 2021-05-03 DIAGNOSIS — E7801 Familial hypercholesterolemia: Secondary | ICD-10-CM | POA: Diagnosis not present

## 2021-05-03 DIAGNOSIS — I10 Essential (primary) hypertension: Secondary | ICD-10-CM | POA: Diagnosis not present

## 2021-05-03 DIAGNOSIS — K2100 Gastro-esophageal reflux disease with esophagitis, without bleeding: Secondary | ICD-10-CM | POA: Diagnosis not present

## 2021-05-03 DIAGNOSIS — M899 Disorder of bone, unspecified: Secondary | ICD-10-CM | POA: Diagnosis not present

## 2021-05-04 DIAGNOSIS — Z947 Corneal transplant status: Secondary | ICD-10-CM | POA: Diagnosis not present

## 2021-05-09 DIAGNOSIS — M2042 Other hammer toe(s) (acquired), left foot: Secondary | ICD-10-CM | POA: Diagnosis not present

## 2021-05-09 DIAGNOSIS — M7752 Other enthesopathy of left foot: Secondary | ICD-10-CM | POA: Diagnosis not present

## 2021-05-09 DIAGNOSIS — L600 Ingrowing nail: Secondary | ICD-10-CM | POA: Diagnosis not present

## 2021-05-09 DIAGNOSIS — M79674 Pain in right toe(s): Secondary | ICD-10-CM | POA: Diagnosis not present

## 2021-05-09 DIAGNOSIS — M79675 Pain in left toe(s): Secondary | ICD-10-CM | POA: Diagnosis not present

## 2021-05-09 DIAGNOSIS — B351 Tinea unguium: Secondary | ICD-10-CM | POA: Diagnosis not present

## 2021-05-10 DIAGNOSIS — M899 Disorder of bone, unspecified: Secondary | ICD-10-CM | POA: Diagnosis not present

## 2021-05-10 DIAGNOSIS — R29898 Other symptoms and signs involving the musculoskeletal system: Secondary | ICD-10-CM | POA: Diagnosis not present

## 2021-05-17 DIAGNOSIS — R29898 Other symptoms and signs involving the musculoskeletal system: Secondary | ICD-10-CM | POA: Diagnosis not present

## 2021-05-17 DIAGNOSIS — M899 Disorder of bone, unspecified: Secondary | ICD-10-CM | POA: Diagnosis not present

## 2021-05-24 DIAGNOSIS — M25512 Pain in left shoulder: Secondary | ICD-10-CM | POA: Diagnosis not present

## 2021-05-24 DIAGNOSIS — R29898 Other symptoms and signs involving the musculoskeletal system: Secondary | ICD-10-CM | POA: Diagnosis not present

## 2021-05-24 DIAGNOSIS — M899 Disorder of bone, unspecified: Secondary | ICD-10-CM | POA: Diagnosis not present

## 2021-05-24 DIAGNOSIS — G8918 Other acute postprocedural pain: Secondary | ICD-10-CM | POA: Diagnosis not present

## 2021-05-24 DIAGNOSIS — M75102 Unspecified rotator cuff tear or rupture of left shoulder, not specified as traumatic: Secondary | ICD-10-CM | POA: Diagnosis not present

## 2021-05-26 ENCOUNTER — Other Ambulatory Visit: Payer: Self-pay | Admitting: Infectious Diseases

## 2021-05-26 DIAGNOSIS — Z1231 Encounter for screening mammogram for malignant neoplasm of breast: Secondary | ICD-10-CM

## 2021-05-31 DIAGNOSIS — M25512 Pain in left shoulder: Secondary | ICD-10-CM | POA: Diagnosis not present

## 2021-05-31 DIAGNOSIS — R29898 Other symptoms and signs involving the musculoskeletal system: Secondary | ICD-10-CM | POA: Diagnosis not present

## 2021-05-31 DIAGNOSIS — M899 Disorder of bone, unspecified: Secondary | ICD-10-CM | POA: Diagnosis not present

## 2021-06-07 DIAGNOSIS — R29898 Other symptoms and signs involving the musculoskeletal system: Secondary | ICD-10-CM | POA: Diagnosis not present

## 2021-06-07 DIAGNOSIS — M25512 Pain in left shoulder: Secondary | ICD-10-CM | POA: Diagnosis not present

## 2021-06-07 DIAGNOSIS — M899 Disorder of bone, unspecified: Secondary | ICD-10-CM | POA: Diagnosis not present

## 2021-06-09 ENCOUNTER — Ambulatory Visit (INDEPENDENT_AMBULATORY_CARE_PROVIDER_SITE_OTHER): Payer: PPO | Admitting: Dermatology

## 2021-06-09 ENCOUNTER — Other Ambulatory Visit: Payer: Self-pay

## 2021-06-09 DIAGNOSIS — D1800 Hemangioma unspecified site: Secondary | ICD-10-CM | POA: Diagnosis not present

## 2021-06-09 DIAGNOSIS — L814 Other melanin hyperpigmentation: Secondary | ICD-10-CM | POA: Diagnosis not present

## 2021-06-09 DIAGNOSIS — L570 Actinic keratosis: Secondary | ICD-10-CM | POA: Diagnosis not present

## 2021-06-09 DIAGNOSIS — D229 Melanocytic nevi, unspecified: Secondary | ICD-10-CM

## 2021-06-09 DIAGNOSIS — L578 Other skin changes due to chronic exposure to nonionizing radiation: Secondary | ICD-10-CM

## 2021-06-09 DIAGNOSIS — L821 Other seborrheic keratosis: Secondary | ICD-10-CM | POA: Diagnosis not present

## 2021-06-09 DIAGNOSIS — L219 Seborrheic dermatitis, unspecified: Secondary | ICD-10-CM

## 2021-06-09 DIAGNOSIS — Z85828 Personal history of other malignant neoplasm of skin: Secondary | ICD-10-CM | POA: Diagnosis not present

## 2021-06-09 DIAGNOSIS — Z1283 Encounter for screening for malignant neoplasm of skin: Secondary | ICD-10-CM

## 2021-06-09 MED ORDER — KETOCONAZOLE 2 % EX SHAM: MEDICATED_SHAMPOO | CUTANEOUS | 11 refills | Status: AC

## 2021-06-09 MED ORDER — CLOBETASOL PROPIONATE 0.05 % EX SOLN: 1.0000 "application " | CUTANEOUS | 11 refills | Status: DC

## 2021-06-09 NOTE — Patient Instructions (Signed)
Seborrheic Keratosis  What causes seborrheic keratoses? Seborrheic keratoses are harmless, common skin growths that first appear during adult life.  As time goes by, more growths appear.  Some people may develop a large number of them.  Seborrheic keratoses appear on both covered and uncovered body parts.  They are not caused by sunlight.  The tendency to develop seborrheic keratoses can be inherited.  They vary in color from skin-colored to gray, brown, or even black.  They can be either smooth or have a rough, warty surface.   Seborrheic keratoses are superficial and look as if they were stuck on the skin.  Under the microscope this type of keratosis looks like layers upon layers of skin.  That is why at times the top layer may seem to fall off, but the rest of the growth remains and re-grows.    Treatment Seborrheic keratoses do not need to be treated, but can easily be removed in the office.  Seborrheic keratoses often cause symptoms when they rub on clothing or jewelry.  Lesions can be in the way of shaving.  If they become inflamed, they can cause itching, soreness, or burning.  Removal of a seborrheic keratosis can be accomplished by freezing, burning, or surgery. If any spot bleeds, scabs, or grows rapidly, please return to have it checked, as these can be an indication of a skin cancer.  Melanoma ABCDEs  Melanoma is the most dangerous type of skin cancer, and is the leading cause of death from skin disease.  You are more likely to develop melanoma if you: Have light-colored skin, light-colored eyes, or red or blond hair Spend a lot of time in the sun Tan regularly, either outdoors or in a tanning bed Have had blistering sunburns, especially during childhood Have a close family member who has had a melanoma Have atypical moles or large birthmarks  Early detection of melanoma is key since treatment is typically straightforward and cure rates are extremely high if we catch it early.   The  first sign of melanoma is often a change in a mole or a new dark spot.  The ABCDE system is a way of remembering the signs of melanoma.  A for asymmetry:  The two halves do not match. B for border:  The edges of the growth are irregular. C for color:  A mixture of colors are present instead of an even brown color. D for diameter:  Melanomas are usually (but not always) greater than 31mm - the size of a pencil eraser. E for evolution:  The spot keeps changing in size, shape, and color.  Please check your skin once per month between visits. You can use a small mirror in front and a large mirror behind you to keep an eye on the back side or your body.   If you see any new or changing lesions before your next follow-up, please call to schedule a visit.  Please continue daily skin protection including broad spectrum sunscreen SPF 30+ to sun-exposed areas, reapplying every 2 hours as needed when you're outdoors.   Staying in the shade or wearing long sleeves, sun glasses (UVA+UVB protection) and wide brim hats (4-inch brim around the entire circumference of the hat) are also recommended for sun protection.    If You Need Anything After Your Visit  If you have any questions or concerns for your doctor, please call our main line at [phone] and press option 4 to reach your doctor's medical assistant. If no one  answers, please leave a voicemail as directed and we will return your call as soon as possible. Messages left after 4 pm will be answered the following business day.   You may also send [place] a message via [place]. We typically respond to MyChart messages within 1-2 business days.  For prescription refills, please ask your pharmacy to contact our office. Our fax number is [phone].  If you have an urgent issue when the clinic is closed that cannot wait until the next business day, you can page your doctor at the number below.    Please note that while we do our best to be available for  urgent issues outside of office hours, we are not available 24/7.   If you have an urgent issue and are unable to reach [place], you may choose to seek medical care at your doctor's office, retail clinic, urgent care center, or emergency room.  If you have a medical emergency, please immediately call 911 or go to the emergency department.  Pager Numbers  - Dr. Nehemiah Massed: [phone]  - Dr. Laurence Ferrari: [phone]  - Dr. Nicole Kindred: [phone]  In the event of inclement weather, please call our main line at [phone] for an update on the status of any delays or closures.  Dermatology Medication Tips: Please keep the boxes that topical medications come in in order to help keep track of the instructions about where and how to use these. Pharmacies typically print the medication instructions only on the boxes and not directly on the medication tubes.   If your medication is too expensive, please contact our office at [phone] option 4 or send [place] a message through [place].   We are unable to tell what your co-pay for medications will be in advance as this is different depending on your insurance coverage. However, we may be able to find a substitute medication at lower cost or fill out paperwork to get insurance to cover a needed medication.   If a prior authorization is required to get your medication covered by your insurance company, please allow [place] 1-2 business days to complete this process.  Drug prices often vary depending on where the prescription is filled and some pharmacies may offer cheaper prices.  The website www.goodrx.com contains coupons for medications through different pharmacies. The prices here do not account for what the cost may be with help from insurance (it may be cheaper with your insurance), but the website can give you the price if you did not use any insurance.  - You can print the associated coupon and take it with your prescription to the pharmacy.  - You may also  stop by our office during regular business hours and pick up a GoodRx coupon card.  - If you need your prescription sent electronically to a different pharmacy, notify our office through [facility] or by phone at [phone] option 4.     Si Usted Necesita Algo Despus de Su Visita  Tambin puede enviarnos un mensaje a travs de Pharmacist, community. Por lo general respondemos a los mensajes de MyChart en el transcurso de 1 a 2 das hbiles.  Para renovar recetas, por favor pida a su farmacia que se ponga en contacto con nuestra oficina. Harland Dingwall de fax es [place] [phone].  Si tiene un asunto urgente cuando la clnica est cerrada y que no puede esperar hasta el siguiente da hbil, puede llamar/localizar a su doctor(a) al nmero que aparece a continuacin.   Por favor, tenga en cuenta que  aunque hacemos todo lo posible para estar disponibles para asuntos urgentes fuera del horario de oficina, no estamos disponibles las 24 horas del da, los 7 das de la [place].   Si tiene un problema urgente y no puede comunicarse con nosotros, puede optar por buscar atencin mdica  en el consultorio de su doctor(a), en una clnica privada, en un centro de atencin urgente o en una sala de emergencias.  Si tiene Engineering geologist, por favor llame inmediatamente al 911 o vaya a la sala de emergencias.  Nmeros de bper  - Dr. Nehemiah Massed: [phone]  - Dra. Moye: [phone]  - Dra. Nicole Kindred: [phone]  En caso de inclemencias del [place], por favor llame a Johnsie Kindred principal al [phone] para una actualizacin sobre el [place] de cualquier retraso o cierre.  Consejos para la medicacin en dermatologa: Por favor, guarde las cajas en las que vienen los medicamentos de uso tpico para ayudarle a seguir las instrucciones sobre dnde y cmo usarlos. Las farmacias generalmente imprimen las instrucciones del medicamento slo en las cajas y no directamente en los tubos del [place].   Si  su medicamento es muy caro, por favor, pngase en contacto con Zigmund Daniel llamando al [phone] y presione la opcin 4 o envenos un mensaje a travs de Pharmacist, community.   No podemos decirle cul ser su copago por los medicamentos por adelantado ya que esto es diferente dependiendo de la cobertura de su seguro. Sin embargo, es posible que podamos encontrar un medicamento sustituto a Electrical engineer un formulario para que el seguro cubra el medicamento que se considera necesario.   Si se requiere una autorizacin previa para que su compaa de seguros [place] su medicamento, por favor permtanos de 1 a 2 das hbiles para completar este proceso.  Los precios de los medicamentos varan con frecuencia dependiendo del Environmental consultant de dnde se surte la receta y alguna farmacias pueden ofrecer precios ms baratos.  El sitio web www.goodrx.com tiene cupones para medicamentos de Airline pilot. Los precios aqu no tienen en cuenta lo que podra costar con la ayuda del seguro (puede ser ms barato con su seguro), pero el sitio web puede darle el precio si no utiliz Research scientist (physical sciences).  - Puede imprimir el cupn correspondiente y llevarlo con su receta a la farmacia.  - Tambin puede pasar por nuestra oficina durante el horario de atencin regular y Charity fundraiser una tarjeta de cupones de GoodRx.  - Si necesita que su receta se enve electrnicamente a una farmacia diferente, informe a nuestra oficina a travs de MyChart de [place] o por telfono llamando al [phone] y presione la opcin 4.

## 2021-06-09 NOTE — Progress Notes (Signed)
Follow-Up Visit   Subjective  [name] is a 86 y.o. female who presents for the following: Follow-up (Patient here for 1 year tbse. Patient has history of bcc and aks. ). The patient presents for Total-Body Skin Exam (TBSE) for skin cancer screening and mole check.  The patient has spots, moles and lesions to be evaluated, some may be new or changing and the patient has concerns that these could be cancer.  The following portions of the chart were reviewed this encounter and updated as appropriate:  Tobacco   Allergies   Meds   Problems   Med Hx   Surg Hx   Fam Hx      Review of Systems: No other skin or systemic complaints except as noted in HPI or Assessment and Plan.  Objective  Well appearing patient in no apparent distress; mood and affect are within normal limits.  A full examination was performed including scalp, head, eyes, ears, nose, lips, neck, chest, axillae, abdomen, back, buttocks, bilateral upper extremities, bilateral lower extremities, hands, feet, fingers, toes, fingernails, and toenails. All findings within normal limits unless otherwise noted below.  Nose x 5 (5) Erythematous thin papules/macules with gritty scale.   Scalp Minimal scale on left scalp   Assessment & Plan  Actinic keratosis (5) Nose x 5 Actinic keratoses are precancerous spots that appear secondary to cumulative UV radiation exposure/sun exposure over time. They are chronic with expected duration over 1 year. A portion of actinic keratoses will progress to squamous cell carcinoma of the skin. It is not possible to reliably predict which spots will progress to skin cancer and so treatment is recommended to prevent development of skin cancer.  Recommend daily broad spectrum sunscreen SPF 30+ to sun-exposed areas, reapply every 2 hours as needed.  Recommend staying in the shade or wearing long sleeves, sun glasses (UVA+UVB protection) and wide brim hats (4-inch brim around the entire  circumference of the hat). Call for new or changing lesions.  Destruction of lesion - Nose x 5 Complexity: simple   Destruction method: cryotherapy   Informed consent: discussed and consent obtained   Timeout:  patient name, date of birth, surgical site, and procedure verified Lesion destroyed using liquid nitrogen: Yes   Region frozen until ice ball extended beyond lesion: Yes   Outcome: patient tolerated procedure well with no complications   Post-procedure details: wound care instructions given   Additional details:  Prior to procedure, discussed risks of blister formation, small wound, skin dyspigmentation, or rare scar following cryotherapy. Recommend Vaseline ointment to treated areas while healing.  Seborrheic dermatitis Scalp Improved on current treatment but persistent and not to goal. Seborrheic Dermatitis  -  is a chronic persistent rash characterized by pinkness and scaling most commonly of the mid face but also can occur on the scalp (dandruff), ears; mid chest, mid back and groin.  It tends to be exacerbated by stress and cooler weather.  People who have neurologic disease may experience new onset or exacerbation of existing seborrheic dermatitis.  The condition is not curable but treatable and can be controlled.   Cont Ketoconazole shampoo  can increase to 4 x weekly if flared Cont Clobetasol shampoo can increase to 4 x weekly if flared  clobetasol (TEMOVATE) 0.05 % external solution - Scalp Apply 1 application topically as directed. apply to scalp, leave on 15 - 30 minutes, then shampoo out. ketoconazole (NIZORAL) 2 % shampoo - Scalp apply once  per week, massage into scalp  and leave in for 10 minutes before rinsing out  Lentigines - Scattered tan macules - Due to sun exposure - Benign-appearing, observe - Recommend daily broad spectrum sunscreen SPF 30+ to sun-exposed areas, reapply every 2 hours as needed. - Call for any changes  Seborrheic Keratoses - Stuck-on,  waxy, tan-brown papules and/or plaques  - Benign-appearing - Discussed benign etiology and prognosis. - Observe - Call for any changes  Melanocytic Nevi - Tan-brown and/or pink-flesh-colored symmetric macules and papules - Benign appearing on exam today - Observation - Call clinic for new or changing moles - Recommend daily use of broad spectrum spf 30+ sunscreen to sun-exposed areas.   Hemangiomas - Red papules - Discussed benign nature - Observe - Call for any changes  Actinic Damage - Chronic condition, secondary to cumulative UV/sun exposure - diffuse scaly erythematous macules with underlying dyspigmentation - Recommend daily broad spectrum sunscreen SPF 30+ to sun-exposed areas, reapply every 2 hours as needed.  - Staying in the shade or wearing long sleeves, sun glasses (UVA+UVB protection) and wide brim hats (4-inch brim around the entire circumference of the hat) are also recommended for sun protection.  - Call for new or changing lesions.  History of Basal Cell Carcinoma of the Skin - No evidence of recurrence today multiple locations see history  - Recommend regular full body skin exams - Recommend daily broad spectrum sunscreen SPF 30+ to sun-exposed areas, reapply every 2 hours as needed.  - Call if any new or changing lesions are noted between office visits   Skin cancer screening performed today. Return for 1 year tbse . IRuthell Rummage, CMA, am acting as scribe for Sarina Ser, MD. Documentation: I have reviewed the above documentation for accuracy and completeness, and I agree with the above.  Sarina Ser, MD

## 2021-06-14 ENCOUNTER — Encounter: Payer: Self-pay | Admitting: Dermatology

## 2021-06-14 DIAGNOSIS — M899 Disorder of bone, unspecified: Secondary | ICD-10-CM | POA: Diagnosis not present

## 2021-06-14 DIAGNOSIS — R29898 Other symptoms and signs involving the musculoskeletal system: Secondary | ICD-10-CM | POA: Diagnosis not present

## 2021-06-14 DIAGNOSIS — M25512 Pain in left shoulder: Secondary | ICD-10-CM | POA: Diagnosis not present

## 2021-06-14 DIAGNOSIS — G8918 Other acute postprocedural pain: Secondary | ICD-10-CM | POA: Diagnosis not present

## 2021-06-21 DIAGNOSIS — M25512 Pain in left shoulder: Secondary | ICD-10-CM | POA: Diagnosis not present

## 2021-06-21 DIAGNOSIS — M899 Disorder of bone, unspecified: Secondary | ICD-10-CM | POA: Diagnosis not present

## 2021-06-21 DIAGNOSIS — R29898 Other symptoms and signs involving the musculoskeletal system: Secondary | ICD-10-CM | POA: Diagnosis not present

## 2021-06-21 DIAGNOSIS — G8918 Other acute postprocedural pain: Secondary | ICD-10-CM | POA: Diagnosis not present

## 2021-06-28 DIAGNOSIS — M19012 Primary osteoarthritis, left shoulder: Secondary | ICD-10-CM | POA: Diagnosis not present

## 2021-06-28 DIAGNOSIS — S46002A Unspecified injury of muscle(s) and tendon(s) of the rotator cuff of left shoulder, initial encounter: Secondary | ICD-10-CM | POA: Diagnosis not present

## 2021-06-28 DIAGNOSIS — M75102 Unspecified rotator cuff tear or rupture of left shoulder, not specified as traumatic: Secondary | ICD-10-CM | POA: Diagnosis not present

## 2021-06-29 DIAGNOSIS — M25512 Pain in left shoulder: Secondary | ICD-10-CM | POA: Diagnosis not present

## 2021-06-29 DIAGNOSIS — M899 Disorder of bone, unspecified: Secondary | ICD-10-CM | POA: Diagnosis not present

## 2021-06-29 DIAGNOSIS — G8918 Other acute postprocedural pain: Secondary | ICD-10-CM | POA: Diagnosis not present

## 2021-06-29 DIAGNOSIS — R29898 Other symptoms and signs involving the musculoskeletal system: Secondary | ICD-10-CM | POA: Diagnosis not present

## 2021-07-09 DIAGNOSIS — M25512 Pain in left shoulder: Secondary | ICD-10-CM | POA: Diagnosis not present

## 2021-07-09 DIAGNOSIS — M899 Disorder of bone, unspecified: Secondary | ICD-10-CM | POA: Diagnosis not present

## 2021-07-09 DIAGNOSIS — G8918 Other acute postprocedural pain: Secondary | ICD-10-CM | POA: Diagnosis not present

## 2021-07-09 DIAGNOSIS — R29898 Other symptoms and signs involving the musculoskeletal system: Secondary | ICD-10-CM | POA: Diagnosis not present

## 2021-07-12 DIAGNOSIS — M899 Disorder of bone, unspecified: Secondary | ICD-10-CM | POA: Diagnosis not present

## 2021-07-12 DIAGNOSIS — R29898 Other symptoms and signs involving the musculoskeletal system: Secondary | ICD-10-CM | POA: Diagnosis not present

## 2021-07-12 DIAGNOSIS — M25512 Pain in left shoulder: Secondary | ICD-10-CM | POA: Diagnosis not present

## 2021-07-14 DIAGNOSIS — R29898 Other symptoms and signs involving the musculoskeletal system: Secondary | ICD-10-CM | POA: Diagnosis not present

## 2021-07-14 DIAGNOSIS — M25512 Pain in left shoulder: Secondary | ICD-10-CM | POA: Diagnosis not present

## 2021-07-14 DIAGNOSIS — M899 Disorder of bone, unspecified: Secondary | ICD-10-CM | POA: Diagnosis not present

## 2021-07-19 DIAGNOSIS — M25512 Pain in left shoulder: Secondary | ICD-10-CM | POA: Diagnosis not present

## 2021-07-19 DIAGNOSIS — M75102 Unspecified rotator cuff tear or rupture of left shoulder, not specified as traumatic: Secondary | ICD-10-CM | POA: Diagnosis not present

## 2021-07-19 DIAGNOSIS — M899 Disorder of bone, unspecified: Secondary | ICD-10-CM | POA: Diagnosis not present

## 2021-07-19 DIAGNOSIS — R29898 Other symptoms and signs involving the musculoskeletal system: Secondary | ICD-10-CM | POA: Diagnosis not present

## 2021-07-21 DIAGNOSIS — M899 Disorder of bone, unspecified: Secondary | ICD-10-CM | POA: Diagnosis not present

## 2021-07-21 DIAGNOSIS — M25512 Pain in left shoulder: Secondary | ICD-10-CM | POA: Diagnosis not present

## 2021-07-21 DIAGNOSIS — R29898 Other symptoms and signs involving the musculoskeletal system: Secondary | ICD-10-CM | POA: Diagnosis not present

## 2021-07-25 DIAGNOSIS — M25412 Effusion, left shoulder: Secondary | ICD-10-CM | POA: Diagnosis not present

## 2021-07-25 DIAGNOSIS — M948X1 Other specified disorders of cartilage, shoulder: Secondary | ICD-10-CM | POA: Diagnosis not present

## 2021-07-25 DIAGNOSIS — R188 Other ascites: Secondary | ICD-10-CM | POA: Diagnosis not present

## 2021-07-25 DIAGNOSIS — M65812 Other synovitis and tenosynovitis, left shoulder: Secondary | ICD-10-CM | POA: Diagnosis not present

## 2021-07-25 DIAGNOSIS — M6289 Other specified disorders of muscle: Secondary | ICD-10-CM | POA: Diagnosis not present

## 2021-07-25 DIAGNOSIS — Z9889 Other specified postprocedural states: Secondary | ICD-10-CM | POA: Diagnosis not present

## 2021-07-25 DIAGNOSIS — M75122 Complete rotator cuff tear or rupture of left shoulder, not specified as traumatic: Secondary | ICD-10-CM | POA: Diagnosis not present

## 2021-07-25 DIAGNOSIS — S46012D Strain of muscle(s) and tendon(s) of the rotator cuff of left shoulder, subsequent encounter: Secondary | ICD-10-CM | POA: Diagnosis not present

## 2021-07-25 DIAGNOSIS — M25512 Pain in left shoulder: Secondary | ICD-10-CM | POA: Diagnosis not present

## 2021-07-25 DIAGNOSIS — M75102 Unspecified rotator cuff tear or rupture of left shoulder, not specified as traumatic: Secondary | ICD-10-CM | POA: Diagnosis not present

## 2021-07-25 DIAGNOSIS — M19012 Primary osteoarthritis, left shoulder: Secondary | ICD-10-CM | POA: Diagnosis not present

## 2021-07-26 DIAGNOSIS — R29898 Other symptoms and signs involving the musculoskeletal system: Secondary | ICD-10-CM | POA: Diagnosis not present

## 2021-07-26 DIAGNOSIS — M899 Disorder of bone, unspecified: Secondary | ICD-10-CM | POA: Diagnosis not present

## 2021-07-26 DIAGNOSIS — M75102 Unspecified rotator cuff tear or rupture of left shoulder, not specified as traumatic: Secondary | ICD-10-CM | POA: Diagnosis not present

## 2021-07-26 DIAGNOSIS — M25512 Pain in left shoulder: Secondary | ICD-10-CM | POA: Diagnosis not present

## 2021-08-04 ENCOUNTER — Emergency Department: Payer: PPO

## 2021-08-04 ENCOUNTER — Other Ambulatory Visit: Payer: Self-pay

## 2021-08-04 DIAGNOSIS — Y9301 Activity, walking, marching and hiking: Secondary | ICD-10-CM | POA: Insufficient documentation

## 2021-08-04 DIAGNOSIS — M19032 Primary osteoarthritis, left wrist: Secondary | ICD-10-CM | POA: Diagnosis not present

## 2021-08-04 DIAGNOSIS — S60212A Contusion of left wrist, initial encounter: Secondary | ICD-10-CM | POA: Insufficient documentation

## 2021-08-04 DIAGNOSIS — R600 Localized edema: Secondary | ICD-10-CM | POA: Diagnosis not present

## 2021-08-04 DIAGNOSIS — S6992XA Unspecified injury of left wrist, hand and finger(s), initial encounter: Secondary | ICD-10-CM | POA: Diagnosis present

## 2021-08-04 DIAGNOSIS — W010XXA Fall on same level from slipping, tripping and stumbling without subsequent striking against object, initial encounter: Secondary | ICD-10-CM | POA: Diagnosis not present

## 2021-08-04 DIAGNOSIS — M25532 Pain in left wrist: Secondary | ICD-10-CM | POA: Diagnosis not present

## 2021-08-04 NOTE — ED Triage Notes (Signed)
Pt presents to ER c/o left wrist pain after a fall this morning.  Pt states she did not trip on anything, just fell.  Pt denies dizziness or any other symptoms prior to fall.  Pt states she landed on left wrist.  Pt states she is able to move wrist but it is very sore.  Pt is A&O x4 at this time in NAD.  Pt denies hitting head, LOC or use of blood thinners.   ?

## 2021-08-05 ENCOUNTER — Emergency Department
Admission: EM | Admit: 2021-08-05 | Discharge: 2021-08-05 | Disposition: A | Discharge status: Home / Self Care | Payer: PPO | Attending: Emergency Medicine | Admitting: Emergency Medicine

## 2021-08-05 ENCOUNTER — Emergency Department: Payer: PPO

## 2021-08-05 DIAGNOSIS — S60212A Contusion of left wrist, initial encounter: Secondary | ICD-10-CM | POA: Diagnosis not present

## 2021-08-05 DIAGNOSIS — R600 Localized edema: Secondary | ICD-10-CM | POA: Diagnosis not present

## 2021-08-05 DIAGNOSIS — M19032 Primary osteoarthritis, left wrist: Secondary | ICD-10-CM | POA: Diagnosis not present

## 2021-08-05 MED ORDER — ACETAMINOPHEN 500 MG PO TABS
1000.0000 mg | ORAL_TABLET | ORAL | Status: AC
  Administered 2021-08-05: 1000 mg via ORAL
  Filled 2021-08-05: qty 2

## 2021-08-05 NOTE — ED Provider Notes (Signed)
? ?[facility] ?Provider Note ? ? ? Event Date/Time  ? First MD Initiated Contact with Patient 08/05/21 0149   ?  (approximate) ? ? ?History  ? ?Fall and Wrist Pain ? ? ?HPI ? ?[name] is a 86 y.o. female   who upon review of note from Dr. Nehemiah Massed has a history of persistent seborrheic dermatitis.  Patient also has a history of somewhat distant ischemic stroke ? ?Today she was in her normal health in her kitchen.  She was walking, tripped slightly and stumbled.  As she did she fell onto her buttock and grabbed herself with her left wrist striking the floor.  She has had pain at the bottom of the hand in the area of the wrist joint since then.  It is fairly tender, may we discussed pain medication and she reports she has used hydrocodone before but it caused her to feel a little bit confused at times, she prefer just to take Tylenol ? ?No recent illness no fevers or chills.  No numbness or weakness in the hand except pain with movement at the left wrist.  Chronic left shoulder discomfort no injury or new concern.  Follows [facility]opedics at [facility] this.  No pain in the forearm or elbow on the left.  No other injury ? ? ?Physical Exam  ? ?Triage Vital Signs: ?ED Triage Vitals  ?Enc Vitals Group  ?   BP 08/04/21 2311 (!) 181/87  ?   Pulse Rate 08/04/21 2311 69  ?   Resp 08/04/21 2311 18  ?   Temp 08/04/21 2311 97.8 ?F (36.6 ?C)  ?   Temp Source 08/04/21 2311 Oral  ?   SpO2 08/04/21 2311 98 %  ?   Weight 08/04/21 2312 205 lb (93 kg)  ?   Height 08/04/21 2312 '5\' 9"'$  (1.753 m)  ?   Head Circumference --   ?   Peak Flow --   ?   Pain Score 08/04/21 2323 8  ?   Pain Loc --   ?   Pain Edu? --   ?   Excl. in [place]? --   ? ? ?Most recent vital signs: ?Vitals:  ? 08/04/21 2311  ?BP: (!) 181/87  ?Pulse: 69  ?Resp: 18  ?Temp: 97.8 ?F (36.6 ?C)  ?SpO2: 98%  ? ? ? ?General: Awake, no distress.  ?CV:  Good peripheral perfusion.  ?RIGHT ?Right upper extremity demonstrates normal strength, good  use of all muscles. No edema bruising or contusions of the right shoulder/upper arm, right elbow, right forearm / hand. Full range of motion of the right right upper extremity without pain. No evidence of trauma. Strong radial pulse. Intact median/ulnar/radial neuro-muscular exam. ? ?LEFT ?Left upper extremity demonstrates normal strength, good use of all muscles except reports pain with flexion extension at the left wrist. No edema bruising or contusions of the left shoulder/upper arm, left elbow, left forearm / hand except some mild ecchymosis over the base on the palmar surface of the left hand. Full range of motion of the left  upper extremity without pain.. Strong radial pulse. Intact median/ulnar/radial neuro-muscular exam. ? ?Resp:  Normal effort.  ?Abd:  No distention.  ?Other:  Patient reports no hip pain.  He is able to walk without difficulty ? ? ?ED Results / Procedures / Treatments  ? ?Labs ?(all labs ordered are listed, but only abnormal results are displayed) ?Labs Reviewed - No data to display ? ? ?EKG ? ? ? ? ?  RADIOLOGY ?I personally viewed and interpreted the patient's x-ray of her left wrist, I do not see clear fracture line but radiologist on the review denotes possible fracture of the triquetrum ? ?CT imaging of the left wrist with further utilized to delineate possible fracture and notable for IMPRESSION: ?1. No acute fracture or dislocation. ?2. Severe osteopenia and degenerative changes. ? ?Upon review of CT imaging, I suspect the patient does not have an acute underlying fracture.  Likely contusion of the left wrist as CT is negative ? ?PROCEDURES: ? ?Critical Care performed: No ? ?Procedures ? ? ?MEDICATIONS ORDERED IN ED: ?Medications  ?acetaminophen (TYLENOL) tablet 1,000 mg (1,000 mg Oral Given 08/05/21 0205)  ? ? ? ?IMPRESSION / MDM / ASSESSMENT AND PLAN / ED COURSE  ?I reviewed the triage vital signs and the nursing notes. ?             ?               ? ?Differential diagnosis  includes, but is not limited to, left wrist fracture.  No evidence of injury to the fingers elbow or remaining extremities.  Chronic left shoulder discomfort which she did reports is not new and unchanged. ? ?Denies any medical symptoms preceding such as lightheadedness fatigue palpitations or recent illness.  She is awake alert fully oriented very pleasant.  Wishes for Tylenol only for pain control at this time and does not appear to be in severe pain ? ?----------------------------------------- ?4:22 AM on 08/05/2021 ?----------------------------------------- ?Resting comfortably, pain controlled with Tylenol.  Recommended that she may use as needed preformed splint, offered to provide one here but patient advises she will purchase one in her pharmacy.  Recommended she may wish to use that for the next few days if it helps relieve her pain.  If pain persist past a week I did recommend she follow-up with orthopedics or Dr. Ola Spurr which she is agreeable with ? ?Return precautions discussed, she has developed numbness weakness increased or severe pain redness swelling or other concerns arise to return to the ER right away ? ?Return precautions and treatment recommendations and follow-up discussed with the patient who is agreeable with the plan. ? ? ? ?  ? ? ?FINAL CLINICAL IMPRESSION(S) / ED DIAGNOSES  ? ?Final diagnoses:  ?Contusion of left wrist, initial encounter  ? ? ? ?Rx / DC Orders  ? ?ED Discharge Orders   ? ? None  ? ?  ? ? ? ?Note:  This document was prepared using Dragon voice recognition software and may include unintentional dictation errors. ?  Delman Kitten, MD ?08/05/21 0422 ? ?

## 2021-08-05 NOTE — ED Notes (Signed)
Pt to Radiology at this time ?

## 2021-08-18 DIAGNOSIS — M75102 Unspecified rotator cuff tear or rupture of left shoulder, not specified as traumatic: Secondary | ICD-10-CM | POA: Diagnosis not present

## 2021-08-18 DIAGNOSIS — M19012 Primary osteoarthritis, left shoulder: Secondary | ICD-10-CM | POA: Diagnosis not present

## 2021-08-18 DIAGNOSIS — M12819 Other specific arthropathies, not elsewhere classified, unspecified shoulder: Secondary | ICD-10-CM | POA: Diagnosis not present

## 2021-08-18 DIAGNOSIS — S46002A Unspecified injury of muscle(s) and tendon(s) of the rotator cuff of left shoulder, initial encounter: Secondary | ICD-10-CM | POA: Diagnosis not present

## 2021-08-25 DIAGNOSIS — Z96653 Presence of artificial knee joint, bilateral: Secondary | ICD-10-CM | POA: Diagnosis not present

## 2021-08-30 ENCOUNTER — Ambulatory Visit
Admission: RE | Admit: 2021-08-30 | Discharge: 2021-08-30 | Disposition: A | Discharge status: Home / Self Care | Payer: PPO | Source: Ambulatory Visit | Attending: Infectious Diseases | Admitting: Infectious Diseases

## 2021-08-30 DIAGNOSIS — Z1231 Encounter for screening mammogram for malignant neoplasm of breast: Secondary | ICD-10-CM | POA: Insufficient documentation

## 2021-09-07 DIAGNOSIS — M9905 Segmental and somatic dysfunction of pelvic region: Secondary | ICD-10-CM | POA: Diagnosis not present

## 2021-09-07 DIAGNOSIS — M955 Acquired deformity of pelvis: Secondary | ICD-10-CM | POA: Diagnosis not present

## 2021-09-07 DIAGNOSIS — M9901 Segmental and somatic dysfunction of cervical region: Secondary | ICD-10-CM | POA: Diagnosis not present

## 2021-09-07 DIAGNOSIS — M5033 Other cervical disc degeneration, cervicothoracic region: Secondary | ICD-10-CM | POA: Diagnosis not present

## 2021-09-09 DIAGNOSIS — M9901 Segmental and somatic dysfunction of cervical region: Secondary | ICD-10-CM | POA: Diagnosis not present

## 2021-09-09 DIAGNOSIS — M955 Acquired deformity of pelvis: Secondary | ICD-10-CM | POA: Diagnosis not present

## 2021-09-09 DIAGNOSIS — M5033 Other cervical disc degeneration, cervicothoracic region: Secondary | ICD-10-CM | POA: Diagnosis not present

## 2021-09-09 DIAGNOSIS — M9905 Segmental and somatic dysfunction of pelvic region: Secondary | ICD-10-CM | POA: Diagnosis not present

## 2021-09-12 DIAGNOSIS — M9901 Segmental and somatic dysfunction of cervical region: Secondary | ICD-10-CM | POA: Diagnosis not present

## 2021-09-12 DIAGNOSIS — M955 Acquired deformity of pelvis: Secondary | ICD-10-CM | POA: Diagnosis not present

## 2021-09-12 DIAGNOSIS — M5033 Other cervical disc degeneration, cervicothoracic region: Secondary | ICD-10-CM | POA: Diagnosis not present

## 2021-09-12 DIAGNOSIS — M9905 Segmental and somatic dysfunction of pelvic region: Secondary | ICD-10-CM | POA: Diagnosis not present

## 2021-09-13 DIAGNOSIS — M25572 Pain in left ankle and joints of left foot: Secondary | ICD-10-CM | POA: Diagnosis not present

## 2021-09-13 DIAGNOSIS — M19072 Primary osteoarthritis, left ankle and foot: Secondary | ICD-10-CM | POA: Diagnosis not present

## 2021-09-14 DIAGNOSIS — M955 Acquired deformity of pelvis: Secondary | ICD-10-CM | POA: Diagnosis not present

## 2021-09-14 DIAGNOSIS — M5033 Other cervical disc degeneration, cervicothoracic region: Secondary | ICD-10-CM | POA: Diagnosis not present

## 2021-09-14 DIAGNOSIS — M9905 Segmental and somatic dysfunction of pelvic region: Secondary | ICD-10-CM | POA: Diagnosis not present

## 2021-09-14 DIAGNOSIS — M9901 Segmental and somatic dysfunction of cervical region: Secondary | ICD-10-CM | POA: Diagnosis not present

## 2021-09-16 DIAGNOSIS — M9901 Segmental and somatic dysfunction of cervical region: Secondary | ICD-10-CM | POA: Diagnosis not present

## 2021-09-16 DIAGNOSIS — M5033 Other cervical disc degeneration, cervicothoracic region: Secondary | ICD-10-CM | POA: Diagnosis not present

## 2021-09-16 DIAGNOSIS — M955 Acquired deformity of pelvis: Secondary | ICD-10-CM | POA: Diagnosis not present

## 2021-09-16 DIAGNOSIS — M9905 Segmental and somatic dysfunction of pelvic region: Secondary | ICD-10-CM | POA: Diagnosis not present

## 2021-09-19 DIAGNOSIS — M5033 Other cervical disc degeneration, cervicothoracic region: Secondary | ICD-10-CM | POA: Diagnosis not present

## 2021-09-19 DIAGNOSIS — M9901 Segmental and somatic dysfunction of cervical region: Secondary | ICD-10-CM | POA: Diagnosis not present

## 2021-09-19 DIAGNOSIS — M955 Acquired deformity of pelvis: Secondary | ICD-10-CM | POA: Diagnosis not present

## 2021-09-19 DIAGNOSIS — M9905 Segmental and somatic dysfunction of pelvic region: Secondary | ICD-10-CM | POA: Diagnosis not present

## 2021-09-21 DIAGNOSIS — M955 Acquired deformity of pelvis: Secondary | ICD-10-CM | POA: Diagnosis not present

## 2021-09-21 DIAGNOSIS — M5033 Other cervical disc degeneration, cervicothoracic region: Secondary | ICD-10-CM | POA: Diagnosis not present

## 2021-09-21 DIAGNOSIS — M9905 Segmental and somatic dysfunction of pelvic region: Secondary | ICD-10-CM | POA: Diagnosis not present

## 2021-09-21 DIAGNOSIS — M9901 Segmental and somatic dysfunction of cervical region: Secondary | ICD-10-CM | POA: Diagnosis not present

## 2021-09-22 DIAGNOSIS — M12812 Other specific arthropathies, not elsewhere classified, left shoulder: Secondary | ICD-10-CM | POA: Insufficient documentation

## 2021-09-23 DIAGNOSIS — Z8673 Personal history of transient ischemic attack (TIA), and cerebral infarction without residual deficits: Secondary | ICD-10-CM | POA: Diagnosis not present

## 2021-09-23 DIAGNOSIS — Z96612 Presence of left artificial shoulder joint: Secondary | ICD-10-CM | POA: Diagnosis not present

## 2021-09-23 DIAGNOSIS — E785 Hyperlipidemia, unspecified: Secondary | ICD-10-CM | POA: Diagnosis not present

## 2021-09-23 DIAGNOSIS — Z4789 Encounter for other orthopedic aftercare: Secondary | ICD-10-CM | POA: Diagnosis not present

## 2021-09-23 DIAGNOSIS — Z7982 Long term (current) use of aspirin: Secondary | ICD-10-CM | POA: Diagnosis not present

## 2021-09-23 DIAGNOSIS — I10 Essential (primary) hypertension: Secondary | ICD-10-CM | POA: Diagnosis not present

## 2021-09-23 DIAGNOSIS — R531 Weakness: Secondary | ICD-10-CM | POA: Diagnosis not present

## 2021-09-23 DIAGNOSIS — Z8249 Family history of ischemic heart disease and other diseases of the circulatory system: Secondary | ICD-10-CM | POA: Diagnosis not present

## 2021-09-23 DIAGNOSIS — Z79899 Other long term (current) drug therapy: Secondary | ICD-10-CM | POA: Diagnosis not present

## 2021-09-23 DIAGNOSIS — J9811 Atelectasis: Secondary | ICD-10-CM | POA: Diagnosis not present

## 2021-09-23 DIAGNOSIS — Z885 Allergy status to narcotic agent status: Secondary | ICD-10-CM | POA: Diagnosis not present

## 2021-09-23 DIAGNOSIS — Z9181 History of falling: Secondary | ICD-10-CM | POA: Diagnosis not present

## 2021-09-23 DIAGNOSIS — M12812 Other specific arthropathies, not elsewhere classified, left shoulder: Secondary | ICD-10-CM | POA: Diagnosis not present

## 2021-09-23 DIAGNOSIS — M7989 Other specified soft tissue disorders: Secondary | ICD-10-CM | POA: Diagnosis not present

## 2021-09-23 DIAGNOSIS — R278 Other lack of coordination: Secondary | ICD-10-CM | POA: Diagnosis not present

## 2021-09-23 DIAGNOSIS — E669 Obesity, unspecified: Secondary | ICD-10-CM | POA: Diagnosis not present

## 2021-09-23 DIAGNOSIS — M75102 Unspecified rotator cuff tear or rupture of left shoulder, not specified as traumatic: Secondary | ICD-10-CM | POA: Diagnosis not present

## 2021-09-23 DIAGNOSIS — Z6830 Body mass index (BMI) 30.0-30.9, adult: Secondary | ICD-10-CM | POA: Diagnosis not present

## 2021-09-23 DIAGNOSIS — M25712 Osteophyte, left shoulder: Secondary | ICD-10-CM | POA: Diagnosis not present

## 2021-09-23 DIAGNOSIS — Z8711 Personal history of peptic ulcer disease: Secondary | ICD-10-CM | POA: Diagnosis not present

## 2021-09-23 DIAGNOSIS — Z981 Arthrodesis status: Secondary | ICD-10-CM | POA: Diagnosis not present

## 2021-09-23 DIAGNOSIS — M19012 Primary osteoarthritis, left shoulder: Secondary | ICD-10-CM | POA: Diagnosis not present

## 2021-09-23 DIAGNOSIS — Z7409 Other reduced mobility: Secondary | ICD-10-CM | POA: Diagnosis not present

## 2021-09-23 DIAGNOSIS — F419 Anxiety disorder, unspecified: Secondary | ICD-10-CM | POA: Diagnosis not present

## 2021-09-23 DIAGNOSIS — G8918 Other acute postprocedural pain: Secondary | ICD-10-CM | POA: Diagnosis not present

## 2021-09-23 DIAGNOSIS — Z792 Long term (current) use of antibiotics: Secondary | ICD-10-CM | POA: Diagnosis not present

## 2021-09-23 DIAGNOSIS — K219 Gastro-esophageal reflux disease without esophagitis: Secondary | ICD-10-CM | POA: Diagnosis not present

## 2021-09-28 DIAGNOSIS — Z96612 Presence of left artificial shoulder joint: Secondary | ICD-10-CM | POA: Diagnosis not present

## 2021-09-28 DIAGNOSIS — R29898 Other symptoms and signs involving the musculoskeletal system: Secondary | ICD-10-CM | POA: Diagnosis not present

## 2021-09-28 DIAGNOSIS — M899 Disorder of bone, unspecified: Secondary | ICD-10-CM | POA: Diagnosis not present

## 2021-09-28 DIAGNOSIS — G8918 Other acute postprocedural pain: Secondary | ICD-10-CM | POA: Diagnosis not present

## 2021-09-28 DIAGNOSIS — M25512 Pain in left shoulder: Secondary | ICD-10-CM | POA: Diagnosis not present

## 2021-10-04 DIAGNOSIS — G5632 Lesion of radial nerve, left upper limb: Secondary | ICD-10-CM | POA: Diagnosis not present

## 2021-10-04 DIAGNOSIS — Z96612 Presence of left artificial shoulder joint: Secondary | ICD-10-CM | POA: Diagnosis not present

## 2021-10-06 DIAGNOSIS — R29898 Other symptoms and signs involving the musculoskeletal system: Secondary | ICD-10-CM | POA: Diagnosis not present

## 2021-10-06 DIAGNOSIS — M899 Disorder of bone, unspecified: Secondary | ICD-10-CM | POA: Diagnosis not present

## 2021-10-06 DIAGNOSIS — R278 Other lack of coordination: Secondary | ICD-10-CM | POA: Diagnosis not present

## 2021-10-06 DIAGNOSIS — M25512 Pain in left shoulder: Secondary | ICD-10-CM | POA: Diagnosis not present

## 2021-10-06 DIAGNOSIS — G8918 Other acute postprocedural pain: Secondary | ICD-10-CM | POA: Diagnosis not present

## 2021-10-06 DIAGNOSIS — Z96612 Presence of left artificial shoulder joint: Secondary | ICD-10-CM | POA: Diagnosis not present

## 2021-10-07 DIAGNOSIS — M79642 Pain in left hand: Secondary | ICD-10-CM | POA: Diagnosis not present

## 2021-10-07 DIAGNOSIS — I10 Essential (primary) hypertension: Secondary | ICD-10-CM | POA: Diagnosis not present

## 2021-10-07 DIAGNOSIS — R600 Localized edema: Secondary | ICD-10-CM | POA: Diagnosis not present

## 2021-10-11 DIAGNOSIS — R7989 Other specified abnormal findings of blood chemistry: Secondary | ICD-10-CM | POA: Diagnosis not present

## 2021-10-14 DIAGNOSIS — G8918 Other acute postprocedural pain: Secondary | ICD-10-CM | POA: Diagnosis not present

## 2021-10-14 DIAGNOSIS — M899 Disorder of bone, unspecified: Secondary | ICD-10-CM | POA: Diagnosis not present

## 2021-10-14 DIAGNOSIS — R278 Other lack of coordination: Secondary | ICD-10-CM | POA: Diagnosis not present

## 2021-10-14 DIAGNOSIS — M25512 Pain in left shoulder: Secondary | ICD-10-CM | POA: Diagnosis not present

## 2021-10-14 DIAGNOSIS — R29898 Other symptoms and signs involving the musculoskeletal system: Secondary | ICD-10-CM | POA: Diagnosis not present

## 2021-10-14 DIAGNOSIS — Z96612 Presence of left artificial shoulder joint: Secondary | ICD-10-CM | POA: Diagnosis not present

## 2021-10-21 DIAGNOSIS — G5632 Lesion of radial nerve, left upper limb: Secondary | ICD-10-CM | POA: Diagnosis not present

## 2021-10-21 DIAGNOSIS — R29898 Other symptoms and signs involving the musculoskeletal system: Secondary | ICD-10-CM | POA: Diagnosis not present

## 2021-10-21 DIAGNOSIS — M25512 Pain in left shoulder: Secondary | ICD-10-CM | POA: Diagnosis not present

## 2021-10-21 DIAGNOSIS — G8918 Other acute postprocedural pain: Secondary | ICD-10-CM | POA: Diagnosis not present

## 2021-10-21 DIAGNOSIS — Z96612 Presence of left artificial shoulder joint: Secondary | ICD-10-CM | POA: Diagnosis not present

## 2021-10-21 DIAGNOSIS — M899 Disorder of bone, unspecified: Secondary | ICD-10-CM | POA: Diagnosis not present

## 2021-10-25 DIAGNOSIS — N3941 Urge incontinence: Secondary | ICD-10-CM | POA: Diagnosis not present

## 2021-10-25 DIAGNOSIS — M1990 Unspecified osteoarthritis, unspecified site: Secondary | ICD-10-CM | POA: Diagnosis not present

## 2021-10-25 DIAGNOSIS — E7801 Familial hypercholesterolemia: Secondary | ICD-10-CM | POA: Diagnosis not present

## 2021-10-25 DIAGNOSIS — I10 Essential (primary) hypertension: Secondary | ICD-10-CM | POA: Diagnosis not present

## 2021-10-25 DIAGNOSIS — K2100 Gastro-esophageal reflux disease with esophagitis, without bleeding: Secondary | ICD-10-CM | POA: Diagnosis not present

## 2021-10-28 DIAGNOSIS — Z96612 Presence of left artificial shoulder joint: Secondary | ICD-10-CM | POA: Diagnosis not present

## 2021-10-28 DIAGNOSIS — M899 Disorder of bone, unspecified: Secondary | ICD-10-CM | POA: Diagnosis not present

## 2021-10-28 DIAGNOSIS — G8918 Other acute postprocedural pain: Secondary | ICD-10-CM | POA: Diagnosis not present

## 2021-10-28 DIAGNOSIS — M25512 Pain in left shoulder: Secondary | ICD-10-CM | POA: Diagnosis not present

## 2021-10-28 DIAGNOSIS — G5632 Lesion of radial nerve, left upper limb: Secondary | ICD-10-CM | POA: Diagnosis not present

## 2021-10-28 DIAGNOSIS — R29898 Other symptoms and signs involving the musculoskeletal system: Secondary | ICD-10-CM | POA: Diagnosis not present

## 2021-10-31 DIAGNOSIS — Z947 Corneal transplant status: Secondary | ICD-10-CM | POA: Diagnosis not present

## 2021-11-01 DIAGNOSIS — G8918 Other acute postprocedural pain: Secondary | ICD-10-CM | POA: Diagnosis not present

## 2021-11-01 DIAGNOSIS — G5632 Lesion of radial nerve, left upper limb: Secondary | ICD-10-CM | POA: Diagnosis not present

## 2021-11-01 DIAGNOSIS — D649 Anemia, unspecified: Secondary | ICD-10-CM | POA: Diagnosis not present

## 2021-11-01 DIAGNOSIS — K2100 Gastro-esophageal reflux disease with esophagitis, without bleeding: Secondary | ICD-10-CM | POA: Diagnosis not present

## 2021-11-01 DIAGNOSIS — E7801 Familial hypercholesterolemia: Secondary | ICD-10-CM | POA: Diagnosis not present

## 2021-11-01 DIAGNOSIS — I10 Essential (primary) hypertension: Secondary | ICD-10-CM | POA: Diagnosis not present

## 2021-11-01 DIAGNOSIS — M899 Disorder of bone, unspecified: Secondary | ICD-10-CM | POA: Diagnosis not present

## 2021-11-01 DIAGNOSIS — R29898 Other symptoms and signs involving the musculoskeletal system: Secondary | ICD-10-CM | POA: Diagnosis not present

## 2021-11-01 DIAGNOSIS — Z0000 Encounter for general adult medical examination without abnormal findings: Secondary | ICD-10-CM | POA: Diagnosis not present

## 2021-11-01 DIAGNOSIS — Z96612 Presence of left artificial shoulder joint: Secondary | ICD-10-CM | POA: Diagnosis not present

## 2021-11-01 DIAGNOSIS — I6330 Cerebral infarction due to thrombosis of unspecified cerebral artery: Secondary | ICD-10-CM | POA: Diagnosis not present

## 2021-11-01 DIAGNOSIS — M25512 Pain in left shoulder: Secondary | ICD-10-CM | POA: Diagnosis not present

## 2021-11-02 DIAGNOSIS — M899 Disorder of bone, unspecified: Secondary | ICD-10-CM | POA: Diagnosis not present

## 2021-11-02 DIAGNOSIS — Z96612 Presence of left artificial shoulder joint: Secondary | ICD-10-CM | POA: Diagnosis not present

## 2021-11-02 DIAGNOSIS — R29898 Other symptoms and signs involving the musculoskeletal system: Secondary | ICD-10-CM | POA: Diagnosis not present

## 2021-11-02 DIAGNOSIS — G5632 Lesion of radial nerve, left upper limb: Secondary | ICD-10-CM | POA: Diagnosis not present

## 2021-11-02 DIAGNOSIS — M25512 Pain in left shoulder: Secondary | ICD-10-CM | POA: Diagnosis not present

## 2021-11-02 DIAGNOSIS — G8918 Other acute postprocedural pain: Secondary | ICD-10-CM | POA: Diagnosis not present

## 2021-11-09 DIAGNOSIS — R600 Localized edema: Secondary | ICD-10-CM | POA: Diagnosis not present

## 2021-11-09 DIAGNOSIS — I10 Essential (primary) hypertension: Secondary | ICD-10-CM | POA: Diagnosis not present

## 2021-11-10 DIAGNOSIS — M25512 Pain in left shoulder: Secondary | ICD-10-CM | POA: Diagnosis not present

## 2021-11-10 DIAGNOSIS — Z96612 Presence of left artificial shoulder joint: Secondary | ICD-10-CM | POA: Diagnosis not present

## 2021-11-10 DIAGNOSIS — M899 Disorder of bone, unspecified: Secondary | ICD-10-CM | POA: Diagnosis not present

## 2021-11-10 DIAGNOSIS — G5632 Lesion of radial nerve, left upper limb: Secondary | ICD-10-CM | POA: Diagnosis not present

## 2021-11-10 DIAGNOSIS — G8918 Other acute postprocedural pain: Secondary | ICD-10-CM | POA: Diagnosis not present

## 2021-11-10 DIAGNOSIS — R29898 Other symptoms and signs involving the musculoskeletal system: Secondary | ICD-10-CM | POA: Diagnosis not present

## 2021-11-11 DIAGNOSIS — G5632 Lesion of radial nerve, left upper limb: Secondary | ICD-10-CM | POA: Diagnosis not present

## 2021-11-11 DIAGNOSIS — M6281 Muscle weakness (generalized): Secondary | ICD-10-CM | POA: Diagnosis not present

## 2021-11-11 DIAGNOSIS — G5691 Unspecified mononeuropathy of right upper limb: Secondary | ICD-10-CM | POA: Diagnosis not present

## 2021-11-16 DIAGNOSIS — M25512 Pain in left shoulder: Secondary | ICD-10-CM | POA: Diagnosis not present

## 2021-11-16 DIAGNOSIS — M899 Disorder of bone, unspecified: Secondary | ICD-10-CM | POA: Diagnosis not present

## 2021-11-16 DIAGNOSIS — G5632 Lesion of radial nerve, left upper limb: Secondary | ICD-10-CM | POA: Diagnosis not present

## 2021-11-16 DIAGNOSIS — R29898 Other symptoms and signs involving the musculoskeletal system: Secondary | ICD-10-CM | POA: Diagnosis not present

## 2021-11-16 DIAGNOSIS — G8918 Other acute postprocedural pain: Secondary | ICD-10-CM | POA: Diagnosis not present

## 2021-11-16 DIAGNOSIS — Z96612 Presence of left artificial shoulder joint: Secondary | ICD-10-CM | POA: Diagnosis not present

## 2021-11-19 DIAGNOSIS — M25512 Pain in left shoulder: Secondary | ICD-10-CM | POA: Diagnosis not present

## 2021-11-19 DIAGNOSIS — R29898 Other symptoms and signs involving the musculoskeletal system: Secondary | ICD-10-CM | POA: Diagnosis not present

## 2021-11-19 DIAGNOSIS — G5632 Lesion of radial nerve, left upper limb: Secondary | ICD-10-CM | POA: Diagnosis not present

## 2021-11-19 DIAGNOSIS — Z96612 Presence of left artificial shoulder joint: Secondary | ICD-10-CM | POA: Diagnosis not present

## 2021-11-19 DIAGNOSIS — M899 Disorder of bone, unspecified: Secondary | ICD-10-CM | POA: Diagnosis not present

## 2021-11-19 DIAGNOSIS — G8918 Other acute postprocedural pain: Secondary | ICD-10-CM | POA: Diagnosis not present

## 2021-11-24 DIAGNOSIS — Z96612 Presence of left artificial shoulder joint: Secondary | ICD-10-CM | POA: Diagnosis not present

## 2021-11-24 DIAGNOSIS — G5632 Lesion of radial nerve, left upper limb: Secondary | ICD-10-CM | POA: Diagnosis not present

## 2021-11-24 DIAGNOSIS — G8918 Other acute postprocedural pain: Secondary | ICD-10-CM | POA: Diagnosis not present

## 2021-11-24 DIAGNOSIS — M25512 Pain in left shoulder: Secondary | ICD-10-CM | POA: Diagnosis not present

## 2021-11-24 DIAGNOSIS — R29898 Other symptoms and signs involving the musculoskeletal system: Secondary | ICD-10-CM | POA: Diagnosis not present

## 2021-11-24 DIAGNOSIS — M899 Disorder of bone, unspecified: Secondary | ICD-10-CM | POA: Diagnosis not present

## 2021-11-25 DIAGNOSIS — R29898 Other symptoms and signs involving the musculoskeletal system: Secondary | ICD-10-CM | POA: Diagnosis not present

## 2021-11-25 DIAGNOSIS — G5632 Lesion of radial nerve, left upper limb: Secondary | ICD-10-CM | POA: Diagnosis not present

## 2021-11-25 DIAGNOSIS — Z96612 Presence of left artificial shoulder joint: Secondary | ICD-10-CM | POA: Diagnosis not present

## 2021-11-25 DIAGNOSIS — G8918 Other acute postprocedural pain: Secondary | ICD-10-CM | POA: Diagnosis not present

## 2021-11-25 DIAGNOSIS — M25512 Pain in left shoulder: Secondary | ICD-10-CM | POA: Diagnosis not present

## 2021-11-25 DIAGNOSIS — M899 Disorder of bone, unspecified: Secondary | ICD-10-CM | POA: Diagnosis not present

## 2021-11-28 DIAGNOSIS — R21 Rash and other nonspecific skin eruption: Secondary | ICD-10-CM | POA: Diagnosis not present

## 2021-12-08 DIAGNOSIS — M25512 Pain in left shoulder: Secondary | ICD-10-CM | POA: Diagnosis not present

## 2021-12-08 DIAGNOSIS — G5632 Lesion of radial nerve, left upper limb: Secondary | ICD-10-CM | POA: Diagnosis not present

## 2021-12-08 DIAGNOSIS — M899 Disorder of bone, unspecified: Secondary | ICD-10-CM | POA: Diagnosis not present

## 2021-12-08 DIAGNOSIS — G8918 Other acute postprocedural pain: Secondary | ICD-10-CM | POA: Diagnosis not present

## 2021-12-08 DIAGNOSIS — Z96612 Presence of left artificial shoulder joint: Secondary | ICD-10-CM | POA: Diagnosis not present

## 2021-12-08 DIAGNOSIS — R29898 Other symptoms and signs involving the musculoskeletal system: Secondary | ICD-10-CM | POA: Diagnosis not present

## 2021-12-09 DIAGNOSIS — M2042 Other hammer toe(s) (acquired), left foot: Secondary | ICD-10-CM | POA: Diagnosis not present

## 2021-12-09 DIAGNOSIS — M79674 Pain in right toe(s): Secondary | ICD-10-CM | POA: Diagnosis not present

## 2021-12-09 DIAGNOSIS — L600 Ingrowing nail: Secondary | ICD-10-CM | POA: Diagnosis not present

## 2021-12-09 DIAGNOSIS — B351 Tinea unguium: Secondary | ICD-10-CM | POA: Diagnosis not present

## 2021-12-09 DIAGNOSIS — M7752 Other enthesopathy of left foot: Secondary | ICD-10-CM | POA: Diagnosis not present

## 2021-12-09 DIAGNOSIS — M79675 Pain in left toe(s): Secondary | ICD-10-CM | POA: Diagnosis not present

## 2021-12-12 DIAGNOSIS — M955 Acquired deformity of pelvis: Secondary | ICD-10-CM | POA: Diagnosis not present

## 2021-12-12 DIAGNOSIS — M9901 Segmental and somatic dysfunction of cervical region: Secondary | ICD-10-CM | POA: Diagnosis not present

## 2021-12-12 DIAGNOSIS — M9905 Segmental and somatic dysfunction of pelvic region: Secondary | ICD-10-CM | POA: Diagnosis not present

## 2021-12-12 DIAGNOSIS — M5033 Other cervical disc degeneration, cervicothoracic region: Secondary | ICD-10-CM | POA: Diagnosis not present

## 2021-12-13 DIAGNOSIS — M899 Disorder of bone, unspecified: Secondary | ICD-10-CM | POA: Diagnosis not present

## 2021-12-13 DIAGNOSIS — Z96612 Presence of left artificial shoulder joint: Secondary | ICD-10-CM | POA: Diagnosis not present

## 2021-12-13 DIAGNOSIS — G5632 Lesion of radial nerve, left upper limb: Secondary | ICD-10-CM | POA: Diagnosis not present

## 2021-12-13 DIAGNOSIS — R29898 Other symptoms and signs involving the musculoskeletal system: Secondary | ICD-10-CM | POA: Diagnosis not present

## 2021-12-15 DIAGNOSIS — G5632 Lesion of radial nerve, left upper limb: Secondary | ICD-10-CM | POA: Diagnosis not present

## 2021-12-15 DIAGNOSIS — R29898 Other symptoms and signs involving the musculoskeletal system: Secondary | ICD-10-CM | POA: Diagnosis not present

## 2021-12-15 DIAGNOSIS — Z96612 Presence of left artificial shoulder joint: Secondary | ICD-10-CM | POA: Diagnosis not present

## 2021-12-15 DIAGNOSIS — M899 Disorder of bone, unspecified: Secondary | ICD-10-CM | POA: Diagnosis not present

## 2021-12-20 DIAGNOSIS — G5632 Lesion of radial nerve, left upper limb: Secondary | ICD-10-CM | POA: Diagnosis not present

## 2021-12-20 DIAGNOSIS — Z96612 Presence of left artificial shoulder joint: Secondary | ICD-10-CM | POA: Diagnosis not present

## 2021-12-20 DIAGNOSIS — M899 Disorder of bone, unspecified: Secondary | ICD-10-CM | POA: Diagnosis not present

## 2021-12-20 DIAGNOSIS — R29898 Other symptoms and signs involving the musculoskeletal system: Secondary | ICD-10-CM | POA: Diagnosis not present

## 2021-12-22 DIAGNOSIS — R29898 Other symptoms and signs involving the musculoskeletal system: Secondary | ICD-10-CM | POA: Diagnosis not present

## 2021-12-22 DIAGNOSIS — M899 Disorder of bone, unspecified: Secondary | ICD-10-CM | POA: Diagnosis not present

## 2021-12-22 DIAGNOSIS — G5632 Lesion of radial nerve, left upper limb: Secondary | ICD-10-CM | POA: Diagnosis not present

## 2021-12-22 DIAGNOSIS — Z96612 Presence of left artificial shoulder joint: Secondary | ICD-10-CM | POA: Diagnosis not present

## 2021-12-26 DIAGNOSIS — M955 Acquired deformity of pelvis: Secondary | ICD-10-CM | POA: Diagnosis not present

## 2021-12-26 DIAGNOSIS — M9905 Segmental and somatic dysfunction of pelvic region: Secondary | ICD-10-CM | POA: Diagnosis not present

## 2021-12-26 DIAGNOSIS — M5033 Other cervical disc degeneration, cervicothoracic region: Secondary | ICD-10-CM | POA: Diagnosis not present

## 2021-12-26 DIAGNOSIS — M9901 Segmental and somatic dysfunction of cervical region: Secondary | ICD-10-CM | POA: Diagnosis not present

## 2021-12-27 DIAGNOSIS — Z96612 Presence of left artificial shoulder joint: Secondary | ICD-10-CM | POA: Diagnosis not present

## 2021-12-27 DIAGNOSIS — M899 Disorder of bone, unspecified: Secondary | ICD-10-CM | POA: Diagnosis not present

## 2021-12-27 DIAGNOSIS — R29898 Other symptoms and signs involving the musculoskeletal system: Secondary | ICD-10-CM | POA: Diagnosis not present

## 2021-12-29 DIAGNOSIS — G5632 Lesion of radial nerve, left upper limb: Secondary | ICD-10-CM | POA: Diagnosis not present

## 2021-12-29 DIAGNOSIS — R29898 Other symptoms and signs involving the musculoskeletal system: Secondary | ICD-10-CM | POA: Diagnosis not present

## 2021-12-29 DIAGNOSIS — Z96612 Presence of left artificial shoulder joint: Secondary | ICD-10-CM | POA: Diagnosis not present

## 2021-12-29 DIAGNOSIS — M899 Disorder of bone, unspecified: Secondary | ICD-10-CM | POA: Diagnosis not present

## 2022-01-02 DIAGNOSIS — G5632 Lesion of radial nerve, left upper limb: Secondary | ICD-10-CM | POA: Diagnosis not present

## 2022-01-02 DIAGNOSIS — M25512 Pain in left shoulder: Secondary | ICD-10-CM | POA: Diagnosis not present

## 2022-01-02 DIAGNOSIS — M899 Disorder of bone, unspecified: Secondary | ICD-10-CM | POA: Diagnosis not present

## 2022-01-02 DIAGNOSIS — G8918 Other acute postprocedural pain: Secondary | ICD-10-CM | POA: Diagnosis not present

## 2022-01-02 DIAGNOSIS — Z96612 Presence of left artificial shoulder joint: Secondary | ICD-10-CM | POA: Diagnosis not present

## 2022-01-02 DIAGNOSIS — R29898 Other symptoms and signs involving the musculoskeletal system: Secondary | ICD-10-CM | POA: Diagnosis not present

## 2022-01-03 DIAGNOSIS — Z96612 Presence of left artificial shoulder joint: Secondary | ICD-10-CM | POA: Diagnosis not present

## 2022-01-03 DIAGNOSIS — M899 Disorder of bone, unspecified: Secondary | ICD-10-CM | POA: Diagnosis not present

## 2022-01-03 DIAGNOSIS — R29898 Other symptoms and signs involving the musculoskeletal system: Secondary | ICD-10-CM | POA: Diagnosis not present

## 2022-01-03 DIAGNOSIS — G5632 Lesion of radial nerve, left upper limb: Secondary | ICD-10-CM | POA: Diagnosis not present

## 2022-01-05 DIAGNOSIS — Z96612 Presence of left artificial shoulder joint: Secondary | ICD-10-CM | POA: Diagnosis not present

## 2022-01-05 DIAGNOSIS — G5632 Lesion of radial nerve, left upper limb: Secondary | ICD-10-CM | POA: Diagnosis not present

## 2022-01-09 DIAGNOSIS — M5033 Other cervical disc degeneration, cervicothoracic region: Secondary | ICD-10-CM | POA: Diagnosis not present

## 2022-01-09 DIAGNOSIS — M9905 Segmental and somatic dysfunction of pelvic region: Secondary | ICD-10-CM | POA: Diagnosis not present

## 2022-01-09 DIAGNOSIS — M955 Acquired deformity of pelvis: Secondary | ICD-10-CM | POA: Diagnosis not present

## 2022-01-09 DIAGNOSIS — M9901 Segmental and somatic dysfunction of cervical region: Secondary | ICD-10-CM | POA: Diagnosis not present

## 2022-01-10 DIAGNOSIS — Z96612 Presence of left artificial shoulder joint: Secondary | ICD-10-CM | POA: Diagnosis not present

## 2022-01-10 DIAGNOSIS — R29898 Other symptoms and signs involving the musculoskeletal system: Secondary | ICD-10-CM | POA: Diagnosis not present

## 2022-01-10 DIAGNOSIS — M899 Disorder of bone, unspecified: Secondary | ICD-10-CM | POA: Diagnosis not present

## 2022-01-10 DIAGNOSIS — G5632 Lesion of radial nerve, left upper limb: Secondary | ICD-10-CM | POA: Diagnosis not present

## 2022-01-17 DIAGNOSIS — Z96612 Presence of left artificial shoulder joint: Secondary | ICD-10-CM | POA: Diagnosis not present

## 2022-01-17 DIAGNOSIS — M899 Disorder of bone, unspecified: Secondary | ICD-10-CM | POA: Diagnosis not present

## 2022-01-17 DIAGNOSIS — G5632 Lesion of radial nerve, left upper limb: Secondary | ICD-10-CM | POA: Diagnosis not present

## 2022-01-17 DIAGNOSIS — R29898 Other symptoms and signs involving the musculoskeletal system: Secondary | ICD-10-CM | POA: Diagnosis not present

## 2022-01-24 DIAGNOSIS — Z96612 Presence of left artificial shoulder joint: Secondary | ICD-10-CM | POA: Diagnosis not present

## 2022-01-24 DIAGNOSIS — G5632 Lesion of radial nerve, left upper limb: Secondary | ICD-10-CM | POA: Diagnosis not present

## 2022-01-24 DIAGNOSIS — M899 Disorder of bone, unspecified: Secondary | ICD-10-CM | POA: Diagnosis not present

## 2022-01-24 DIAGNOSIS — R29898 Other symptoms and signs involving the musculoskeletal system: Secondary | ICD-10-CM | POA: Diagnosis not present

## 2022-01-25 DIAGNOSIS — M9905 Segmental and somatic dysfunction of pelvic region: Secondary | ICD-10-CM | POA: Diagnosis not present

## 2022-01-25 DIAGNOSIS — M9901 Segmental and somatic dysfunction of cervical region: Secondary | ICD-10-CM | POA: Diagnosis not present

## 2022-01-25 DIAGNOSIS — M955 Acquired deformity of pelvis: Secondary | ICD-10-CM | POA: Diagnosis not present

## 2022-01-25 DIAGNOSIS — M5033 Other cervical disc degeneration, cervicothoracic region: Secondary | ICD-10-CM | POA: Diagnosis not present

## 2022-01-31 DIAGNOSIS — G5632 Lesion of radial nerve, left upper limb: Secondary | ICD-10-CM | POA: Diagnosis not present

## 2022-01-31 DIAGNOSIS — M899 Disorder of bone, unspecified: Secondary | ICD-10-CM | POA: Diagnosis not present

## 2022-01-31 DIAGNOSIS — R29898 Other symptoms and signs involving the musculoskeletal system: Secondary | ICD-10-CM | POA: Diagnosis not present

## 2022-01-31 DIAGNOSIS — Z96612 Presence of left artificial shoulder joint: Secondary | ICD-10-CM | POA: Diagnosis not present

## 2022-02-02 DIAGNOSIS — G5632 Lesion of radial nerve, left upper limb: Secondary | ICD-10-CM | POA: Diagnosis not present

## 2022-02-02 DIAGNOSIS — Z96612 Presence of left artificial shoulder joint: Secondary | ICD-10-CM | POA: Diagnosis not present

## 2022-02-02 DIAGNOSIS — G8918 Other acute postprocedural pain: Secondary | ICD-10-CM | POA: Diagnosis not present

## 2022-02-02 DIAGNOSIS — R29898 Other symptoms and signs involving the musculoskeletal system: Secondary | ICD-10-CM | POA: Diagnosis not present

## 2022-02-02 DIAGNOSIS — M25512 Pain in left shoulder: Secondary | ICD-10-CM | POA: Diagnosis not present

## 2022-02-02 DIAGNOSIS — M899 Disorder of bone, unspecified: Secondary | ICD-10-CM | POA: Diagnosis not present

## 2022-02-08 DIAGNOSIS — M9905 Segmental and somatic dysfunction of pelvic region: Secondary | ICD-10-CM | POA: Diagnosis not present

## 2022-02-08 DIAGNOSIS — M955 Acquired deformity of pelvis: Secondary | ICD-10-CM | POA: Diagnosis not present

## 2022-02-08 DIAGNOSIS — M5033 Other cervical disc degeneration, cervicothoracic region: Secondary | ICD-10-CM | POA: Diagnosis not present

## 2022-02-08 DIAGNOSIS — M9901 Segmental and somatic dysfunction of cervical region: Secondary | ICD-10-CM | POA: Diagnosis not present

## 2022-02-16 DIAGNOSIS — G5632 Lesion of radial nerve, left upper limb: Secondary | ICD-10-CM | POA: Diagnosis not present

## 2022-02-16 DIAGNOSIS — Z96612 Presence of left artificial shoulder joint: Secondary | ICD-10-CM | POA: Diagnosis not present

## 2022-02-22 DIAGNOSIS — M955 Acquired deformity of pelvis: Secondary | ICD-10-CM | POA: Diagnosis not present

## 2022-02-22 DIAGNOSIS — M9901 Segmental and somatic dysfunction of cervical region: Secondary | ICD-10-CM | POA: Diagnosis not present

## 2022-02-22 DIAGNOSIS — M5033 Other cervical disc degeneration, cervicothoracic region: Secondary | ICD-10-CM | POA: Diagnosis not present

## 2022-02-22 DIAGNOSIS — M9905 Segmental and somatic dysfunction of pelvic region: Secondary | ICD-10-CM | POA: Diagnosis not present

## 2022-02-23 DIAGNOSIS — Z96612 Presence of left artificial shoulder joint: Secondary | ICD-10-CM | POA: Diagnosis not present

## 2022-02-23 DIAGNOSIS — G5632 Lesion of radial nerve, left upper limb: Secondary | ICD-10-CM | POA: Diagnosis not present

## 2022-02-23 DIAGNOSIS — M899 Disorder of bone, unspecified: Secondary | ICD-10-CM | POA: Diagnosis not present

## 2022-02-23 DIAGNOSIS — R29898 Other symptoms and signs involving the musculoskeletal system: Secondary | ICD-10-CM | POA: Diagnosis not present

## 2022-02-24 DIAGNOSIS — I6330 Cerebral infarction due to thrombosis of unspecified cerebral artery: Secondary | ICD-10-CM | POA: Diagnosis not present

## 2022-02-24 DIAGNOSIS — D649 Anemia, unspecified: Secondary | ICD-10-CM | POA: Diagnosis not present

## 2022-02-24 DIAGNOSIS — E7801 Familial hypercholesterolemia: Secondary | ICD-10-CM | POA: Diagnosis not present

## 2022-02-24 DIAGNOSIS — I10 Essential (primary) hypertension: Secondary | ICD-10-CM | POA: Diagnosis not present

## 2022-02-28 DIAGNOSIS — G5632 Lesion of radial nerve, left upper limb: Secondary | ICD-10-CM | POA: Diagnosis not present

## 2022-02-28 DIAGNOSIS — Z96612 Presence of left artificial shoulder joint: Secondary | ICD-10-CM | POA: Diagnosis not present

## 2022-02-28 DIAGNOSIS — G5603 Carpal tunnel syndrome, bilateral upper limbs: Secondary | ICD-10-CM | POA: Diagnosis not present

## 2022-03-03 DIAGNOSIS — E7801 Familial hypercholesterolemia: Secondary | ICD-10-CM | POA: Diagnosis not present

## 2022-03-03 DIAGNOSIS — R600 Localized edema: Secondary | ICD-10-CM | POA: Diagnosis not present

## 2022-03-03 DIAGNOSIS — I10 Essential (primary) hypertension: Secondary | ICD-10-CM | POA: Diagnosis not present

## 2022-03-03 DIAGNOSIS — D649 Anemia, unspecified: Secondary | ICD-10-CM | POA: Diagnosis not present

## 2022-03-03 DIAGNOSIS — I6330 Cerebral infarction due to thrombosis of unspecified cerebral artery: Secondary | ICD-10-CM | POA: Diagnosis not present

## 2022-03-09 DIAGNOSIS — G8929 Other chronic pain: Secondary | ICD-10-CM | POA: Diagnosis not present

## 2022-03-09 DIAGNOSIS — M25512 Pain in left shoulder: Secondary | ICD-10-CM | POA: Diagnosis not present

## 2022-03-09 DIAGNOSIS — G5632 Lesion of radial nerve, left upper limb: Secondary | ICD-10-CM | POA: Diagnosis not present

## 2022-03-09 DIAGNOSIS — Z96612 Presence of left artificial shoulder joint: Secondary | ICD-10-CM | POA: Diagnosis not present

## 2022-03-15 DIAGNOSIS — M9901 Segmental and somatic dysfunction of cervical region: Secondary | ICD-10-CM | POA: Diagnosis not present

## 2022-03-15 DIAGNOSIS — M5033 Other cervical disc degeneration, cervicothoracic region: Secondary | ICD-10-CM | POA: Diagnosis not present

## 2022-03-15 DIAGNOSIS — M9905 Segmental and somatic dysfunction of pelvic region: Secondary | ICD-10-CM | POA: Diagnosis not present

## 2022-03-15 DIAGNOSIS — M955 Acquired deformity of pelvis: Secondary | ICD-10-CM | POA: Diagnosis not present

## 2022-03-29 DIAGNOSIS — M5033 Other cervical disc degeneration, cervicothoracic region: Secondary | ICD-10-CM | POA: Diagnosis not present

## 2022-03-29 DIAGNOSIS — M955 Acquired deformity of pelvis: Secondary | ICD-10-CM | POA: Diagnosis not present

## 2022-03-29 DIAGNOSIS — M9901 Segmental and somatic dysfunction of cervical region: Secondary | ICD-10-CM | POA: Diagnosis not present

## 2022-03-29 DIAGNOSIS — M9905 Segmental and somatic dysfunction of pelvic region: Secondary | ICD-10-CM | POA: Diagnosis not present

## 2022-04-05 DIAGNOSIS — R197 Diarrhea, unspecified: Secondary | ICD-10-CM | POA: Diagnosis not present

## 2022-04-05 DIAGNOSIS — D649 Anemia, unspecified: Secondary | ICD-10-CM | POA: Diagnosis not present

## 2022-04-05 DIAGNOSIS — Z8719 Personal history of other diseases of the digestive system: Secondary | ICD-10-CM | POA: Diagnosis not present

## 2022-04-05 DIAGNOSIS — R195 Other fecal abnormalities: Secondary | ICD-10-CM | POA: Diagnosis not present

## 2022-04-12 DIAGNOSIS — M9901 Segmental and somatic dysfunction of cervical region: Secondary | ICD-10-CM | POA: Diagnosis not present

## 2022-04-12 DIAGNOSIS — M9905 Segmental and somatic dysfunction of pelvic region: Secondary | ICD-10-CM | POA: Diagnosis not present

## 2022-04-12 DIAGNOSIS — M955 Acquired deformity of pelvis: Secondary | ICD-10-CM | POA: Diagnosis not present

## 2022-04-12 DIAGNOSIS — M5033 Other cervical disc degeneration, cervicothoracic region: Secondary | ICD-10-CM | POA: Diagnosis not present

## 2022-04-17 DIAGNOSIS — Z8719 Personal history of other diseases of the digestive system: Secondary | ICD-10-CM | POA: Diagnosis not present

## 2022-04-17 DIAGNOSIS — R195 Other fecal abnormalities: Secondary | ICD-10-CM | POA: Diagnosis not present

## 2022-04-26 DIAGNOSIS — M5033 Other cervical disc degeneration, cervicothoracic region: Secondary | ICD-10-CM | POA: Diagnosis not present

## 2022-04-26 DIAGNOSIS — M9901 Segmental and somatic dysfunction of cervical region: Secondary | ICD-10-CM | POA: Diagnosis not present

## 2022-04-26 DIAGNOSIS — M955 Acquired deformity of pelvis: Secondary | ICD-10-CM | POA: Diagnosis not present

## 2022-04-26 DIAGNOSIS — M9905 Segmental and somatic dysfunction of pelvic region: Secondary | ICD-10-CM | POA: Diagnosis not present

## 2022-05-05 DIAGNOSIS — R29898 Other symptoms and signs involving the musculoskeletal system: Secondary | ICD-10-CM | POA: Diagnosis not present

## 2022-05-05 DIAGNOSIS — G5632 Lesion of radial nerve, left upper limb: Secondary | ICD-10-CM | POA: Diagnosis not present

## 2022-05-05 DIAGNOSIS — M899 Disorder of bone, unspecified: Secondary | ICD-10-CM | POA: Diagnosis not present

## 2022-05-05 DIAGNOSIS — Z96612 Presence of left artificial shoulder joint: Secondary | ICD-10-CM | POA: Diagnosis not present

## 2022-05-05 DIAGNOSIS — G8918 Other acute postprocedural pain: Secondary | ICD-10-CM | POA: Diagnosis not present

## 2022-05-05 DIAGNOSIS — M25512 Pain in left shoulder: Secondary | ICD-10-CM | POA: Diagnosis not present

## 2022-05-10 DIAGNOSIS — M5033 Other cervical disc degeneration, cervicothoracic region: Secondary | ICD-10-CM | POA: Diagnosis not present

## 2022-05-10 DIAGNOSIS — M9905 Segmental and somatic dysfunction of pelvic region: Secondary | ICD-10-CM | POA: Diagnosis not present

## 2022-05-10 DIAGNOSIS — M955 Acquired deformity of pelvis: Secondary | ICD-10-CM | POA: Diagnosis not present

## 2022-05-10 DIAGNOSIS — M9901 Segmental and somatic dysfunction of cervical region: Secondary | ICD-10-CM | POA: Diagnosis not present

## 2022-05-17 DIAGNOSIS — Z947 Corneal transplant status: Secondary | ICD-10-CM | POA: Diagnosis not present

## 2022-05-17 DIAGNOSIS — Z961 Presence of intraocular lens: Secondary | ICD-10-CM | POA: Diagnosis not present

## 2022-05-31 DIAGNOSIS — Z96612 Presence of left artificial shoulder joint: Secondary | ICD-10-CM | POA: Diagnosis not present

## 2022-05-31 DIAGNOSIS — R29898 Other symptoms and signs involving the musculoskeletal system: Secondary | ICD-10-CM | POA: Diagnosis not present

## 2022-05-31 DIAGNOSIS — G5632 Lesion of radial nerve, left upper limb: Secondary | ICD-10-CM | POA: Diagnosis not present

## 2022-05-31 DIAGNOSIS — M25512 Pain in left shoulder: Secondary | ICD-10-CM | POA: Diagnosis not present

## 2022-05-31 DIAGNOSIS — M899 Disorder of bone, unspecified: Secondary | ICD-10-CM | POA: Diagnosis not present

## 2022-05-31 DIAGNOSIS — G8918 Other acute postprocedural pain: Secondary | ICD-10-CM | POA: Diagnosis not present

## 2022-06-14 DIAGNOSIS — M5033 Other cervical disc degeneration, cervicothoracic region: Secondary | ICD-10-CM | POA: Diagnosis not present

## 2022-06-14 DIAGNOSIS — M9901 Segmental and somatic dysfunction of cervical region: Secondary | ICD-10-CM | POA: Diagnosis not present

## 2022-06-14 DIAGNOSIS — M9905 Segmental and somatic dysfunction of pelvic region: Secondary | ICD-10-CM | POA: Diagnosis not present

## 2022-06-14 DIAGNOSIS — M955 Acquired deformity of pelvis: Secondary | ICD-10-CM | POA: Diagnosis not present

## 2022-06-15 ENCOUNTER — Ambulatory Visit (INDEPENDENT_AMBULATORY_CARE_PROVIDER_SITE_OTHER): Payer: PPO | Admitting: Dermatology

## 2022-06-15 VITALS — BP 121/69

## 2022-06-15 DIAGNOSIS — Z85828 Personal history of other malignant neoplasm of skin: Secondary | ICD-10-CM | POA: Diagnosis not present

## 2022-06-15 DIAGNOSIS — L304 Erythema intertrigo: Secondary | ICD-10-CM

## 2022-06-15 DIAGNOSIS — L814 Other melanin hyperpigmentation: Secondary | ICD-10-CM

## 2022-06-15 DIAGNOSIS — Z1283 Encounter for screening for malignant neoplasm of skin: Secondary | ICD-10-CM

## 2022-06-15 DIAGNOSIS — D229 Melanocytic nevi, unspecified: Secondary | ICD-10-CM

## 2022-06-15 DIAGNOSIS — L578 Other skin changes due to chronic exposure to nonionizing radiation: Secondary | ICD-10-CM | POA: Diagnosis not present

## 2022-06-15 DIAGNOSIS — L821 Other seborrheic keratosis: Secondary | ICD-10-CM

## 2022-06-15 DIAGNOSIS — Z79899 Other long term (current) drug therapy: Secondary | ICD-10-CM

## 2022-06-15 DIAGNOSIS — L570 Actinic keratosis: Secondary | ICD-10-CM | POA: Diagnosis not present

## 2022-06-15 MED ORDER — HYDROCORTISONE 2.5 % EX CREA: TOPICAL_CREAM | Freq: Every morning | CUTANEOUS | 11 refills | Status: AC

## 2022-06-15 MED ORDER — KETOCONAZOLE 2 % EX CREA: 1.0000 | TOPICAL_CREAM | Freq: Every evening | CUTANEOUS | 11 refills | Status: AC

## 2022-06-15 NOTE — Progress Notes (Signed)
Follow-Up Visit   Subjective  [name] is a 87 y.o. female who presents for the following: Annual Exam (History of BCC - The patient presents for Total-Body Skin Exam (TBSE) for skin cancer screening and mole check.  The patient has spots, moles and lesions to be evaluated, some may be new or changing and the patient has concerns that these could be cancer./) and Rash (Inframammary area - she got a bite while working in her yard and she treated it with [place] 0.1% and developed a rash).  The following portions of the chart were reviewed this encounter and updated as appropriate:   Tobacco  Allergies  Meds  Problems  Med Hx  Surg Hx  Fam Hx     Review of Systems:  No other skin or systemic complaints except as noted in HPI or Assessment and Plan.  Objective  Well appearing patient in no apparent distress; mood and affect are within normal limits.  A full examination was performed including scalp, head, eyes, ears, nose, lips, neck, chest, axillae, abdomen, back, buttocks, bilateral upper extremities, bilateral lower extremities, hands, feet, fingers, toes, fingernails, and toenails. All findings within normal limits unless otherwise noted below.  Left Inframammary Fold Erythema  Nose (2) Erythematous thin papules/macules with gritty scale.    Assessment & Plan   History of Basal Cell Carcinoma of the Skin - No evidence of recurrence today - Recommend regular full body skin exams - Recommend daily broad spectrum sunscreen SPF 30+ to sun-exposed areas, reapply every 2 hours as needed.  - Call if any new or changing lesions are noted between office visits  Lentigines - Scattered tan macules - Due to sun exposure - Benign-appearing, observe - Recommend daily broad spectrum sunscreen SPF 30+ to sun-exposed areas, reapply every 2 hours as needed. - Call for any changes  Seborrheic Keratoses - Stuck-on, waxy, tan-brown papules and/or plaques  - Benign-appearing -  Discussed benign etiology and prognosis. - Observe - Call for any changes  Melanocytic Nevi - Tan-brown and/or pink-flesh-colored symmetric macules and papules - Benign appearing on exam today - Observation - Call clinic for new or changing moles - Recommend daily use of broad spectrum spf 30+ sunscreen to sun-exposed areas.   Hemangiomas - Red papules - Discussed benign nature - Observe - Call for any changes  Actinic Damage - Chronic condition, secondary to cumulative UV/sun exposure - diffuse scaly erythematous macules with underlying dyspigmentation - Recommend daily broad spectrum sunscreen SPF 30+ to sun-exposed areas, reapply every 2 hours as needed.  - Staying in the shade or wearing long sleeves, sun glasses (UVA+UVB protection) and wide brim hats (4-inch brim around the entire circumference of the hat) are also recommended for sun protection.  - Call for new or changing lesions.  Skin cancer screening performed today.  Erythema intertrigo Left Inframammary Fold Intertrigo is a chronic recurrent rash that occurs in skin fold areas that may be associated with friction; heat; moisture; yeast; fungus; and bacteria.  It is exacerbated by increased movement / activity; sweating; and higher atmospheric temperature.  Discontinue TMC 0.1% cream  Start HC 2.5% cream qam 5 times per week prn active rash, Ketoconazole 2% cream qpm  hydrocortisone 2.5 % cream - Left Inframammary Fold Apply topically every morning. 5 days per week as needed for active rash ketoconazole (NIZORAL) 2 % cream - Left Inframammary Fold Apply 1 Application topically every evening.  AK (actinic keratosis) (2) Nose Destruction of lesion - Nose Complexity: simple  Destruction method: cryotherapy   Informed consent: discussed and consent obtained   Timeout:  patient name, date of birth, surgical site, and procedure verified Lesion destroyed using liquid nitrogen: Yes   Region frozen until ice ball  extended beyond lesion: Yes   Outcome: patient tolerated procedure well with no complications   Post-procedure details: wound care instructions given    Return in about 1 year (around 06/16/2023) for TBSE.  I, Ashok Cordia, CMA, am acting as scribe for Sarina Ser, MD . Documentation: I have reviewed the above documentation for accuracy and completeness, and I agree with the above.  Sarina Ser, MD

## 2022-06-15 NOTE — Patient Instructions (Signed)
Due to recent changes in healthcare laws, you may see results of your pathology and/or laboratory studies on MyChart before the doctors have had a chance to review them. We understand that in some cases there may be results that are confusing or concerning to you. Please understand that not all results are received at the same time and often the doctors may need to interpret multiple results in order to provide you with the best plan of care or course of treatment. Therefore, we ask that you please give us 2 business days to thoroughly review all your results before contacting the office for clarification. Should we see a critical lab result, you will be contacted sooner.   If You Need Anything After Your Visit  If you have any questions or concerns for your doctor, please call our main line at [phone] and press option 4 to reach your doctor's medical assistant. If no one answers, please leave a voicemail as directed and we will return your call as soon as possible. Messages left after 4 pm will be answered the following business day.   You may also send us a message via MyChart. We typically respond to MyChart messages within 1-2 business days.  For prescription refills, please ask your pharmacy to contact our office. Our fax number is [phone].  If you have an urgent issue when the clinic is closed that cannot wait until the next business day, you can page your doctor at the number below.    Please note that while we do our best to be available for urgent issues outside of office hours, we are not available 24/7.   If you have an urgent issue and are unable to reach us, you may choose to seek medical care at your doctor's office, retail clinic, urgent care center, or emergency room.  If you have a medical emergency, please immediately call 911 or go to the emergency department.  Pager Numbers  - Dr. Kowalski: [phone]  - Dr. Moye: [phone]  - Dr. Stewart:  [phone]  In the event of inclement weather, please call our main line at [phone] for an update on the status of any delays or closures.  Dermatology Medication Tips: Please keep the boxes that topical medications come in in order to help keep track of the instructions about where and how to use these. Pharmacies typically print the medication instructions only on the boxes and not directly on the medication tubes.   If your medication is too expensive, please contact our office at [phone] option 4 or send us a message through MyChart.   We are unable to tell what your co-pay for medications will be in advance as this is different depending on your insurance coverage. However, we may be able to find a substitute medication at lower cost or fill out paperwork to get insurance to cover a needed medication.   If a prior authorization is required to get your medication covered by your insurance company, please allow us 1-2 business days to complete this process.  Drug prices often vary depending on where the prescription is filled and some pharmacies may offer cheaper prices.  The website www.goodrx.com contains coupons for medications through different pharmacies. The prices here do not account for what the cost may be with help from insurance (it may be cheaper with your insurance), but the website can give you the price if you did not use any insurance.  - You can print the associated coupon and take it with   your prescription to the pharmacy.  - You may also stop by our office during regular business hours and pick up a GoodRx coupon card.  - If you need your prescription sent electronically to a different pharmacy, notify our office through [facility] MyChart or by phone at [phone] option 4.     Si Usted Necesita Algo Despus de Su Visita  Tambin puede enviarnos un mensaje a travs de MyChart. Por lo general respondemos a los mensajes de MyChart en el transcurso de 1 a 2  das hbiles.  Para renovar recetas, por favor pida a su farmacia que se ponga en contacto con nuestra oficina. Nuestro nmero de fax es el [phone].  Si tiene un asunto urgente cuando la clnica est cerrada y que no puede esperar hasta el siguiente da hbil, puede llamar/localizar a su doctor(a) al nmero que aparece a continuacin.   Por favor, tenga en cuenta que aunque hacemos todo lo posible para estar disponibles para asuntos urgentes fuera del horario de oficina, no estamos disponibles las 24 horas del da, los 7 das de la semana.   Si tiene un problema urgente y no puede comunicarse con nosotros, puede optar por buscar atencin mdica  en el consultorio de su doctor(a), en una clnica privada, en un centro de atencin urgente o en una sala de emergencias.  Si tiene una emergencia mdica, por favor llame inmediatamente al 911 o vaya a la sala de emergencias.  Nmeros de bper  - Dr. Kowalski: [phone]  - Dra. Moye: [phone]  - Dra. Stewart: [phone]  En caso de inclemencias del tiempo, por favor llame a nuestra lnea principal al [phone] para una actualizacin sobre el estado de cualquier retraso o cierre.  Consejos para la medicacin en dermatologa: Por favor, guarde las cajas en las que vienen los medicamentos de uso tpico para ayudarle a seguir las instrucciones sobre dnde y cmo usarlos. Las farmacias generalmente imprimen las instrucciones del medicamento slo en las cajas y no directamente en los tubos del medicamento.   Si su medicamento es muy caro, por favor, pngase en contacto con nuestra oficina llamando al [phone] y presione la opcin 4 o envenos un mensaje a travs de MyChart.   No podemos decirle cul ser su copago por los medicamentos por adelantado ya que esto es diferente dependiendo de la cobertura de su seguro. Sin embargo, es posible que podamos encontrar un medicamento sustituto a menor costo o llenar un formulario para que el  seguro cubra el medicamento que se considera necesario.   Si se requiere una autorizacin previa para que su compaa de seguros cubra su medicamento, por favor permtanos de 1 a 2 das hbiles para completar este proceso.  Los precios de los medicamentos varan con frecuencia dependiendo del lugar de dnde se surte la receta y alguna farmacias pueden ofrecer precios ms baratos.  El sitio web www.goodrx.com tiene cupones para medicamentos de diferentes farmacias. Los precios aqu no tienen en cuenta lo que podra costar con la ayuda del seguro (puede ser ms barato con su seguro), pero el sitio web puede darle el precio si no utiliz ningn seguro.  - Puede imprimir el cupn correspondiente y llevarlo con su receta a la farmacia.  - Tambin puede pasar por nuestra oficina durante el horario de atencin regular y recoger una tarjeta de cupones de GoodRx.  - Si necesita que su receta se enve electrnicamente a una farmacia diferente, informe a nuestra oficina a travs de MyChart de [place]   o por telfono llamando al [phone] y presione la opcin 4.  

## 2022-06-21 DIAGNOSIS — Z96612 Presence of left artificial shoulder joint: Secondary | ICD-10-CM | POA: Diagnosis not present

## 2022-06-21 DIAGNOSIS — G8918 Other acute postprocedural pain: Secondary | ICD-10-CM | POA: Diagnosis not present

## 2022-06-21 DIAGNOSIS — R29898 Other symptoms and signs involving the musculoskeletal system: Secondary | ICD-10-CM | POA: Diagnosis not present

## 2022-06-21 DIAGNOSIS — M25512 Pain in left shoulder: Secondary | ICD-10-CM | POA: Diagnosis not present

## 2022-06-21 DIAGNOSIS — G5632 Lesion of radial nerve, left upper limb: Secondary | ICD-10-CM | POA: Diagnosis not present

## 2022-06-21 DIAGNOSIS — M899 Disorder of bone, unspecified: Secondary | ICD-10-CM | POA: Diagnosis not present

## 2022-06-23 ENCOUNTER — Encounter: Payer: Self-pay | Admitting: Dermatology

## 2022-06-27 DIAGNOSIS — M25512 Pain in left shoulder: Secondary | ICD-10-CM | POA: Diagnosis not present

## 2022-06-27 DIAGNOSIS — G5632 Lesion of radial nerve, left upper limb: Secondary | ICD-10-CM | POA: Diagnosis not present

## 2022-06-27 DIAGNOSIS — G8929 Other chronic pain: Secondary | ICD-10-CM | POA: Diagnosis not present

## 2022-06-27 DIAGNOSIS — Z96612 Presence of left artificial shoulder joint: Secondary | ICD-10-CM | POA: Diagnosis not present

## 2022-06-28 DIAGNOSIS — M955 Acquired deformity of pelvis: Secondary | ICD-10-CM | POA: Diagnosis not present

## 2022-06-28 DIAGNOSIS — M5033 Other cervical disc degeneration, cervicothoracic region: Secondary | ICD-10-CM | POA: Diagnosis not present

## 2022-06-28 DIAGNOSIS — M9905 Segmental and somatic dysfunction of pelvic region: Secondary | ICD-10-CM | POA: Diagnosis not present

## 2022-06-28 DIAGNOSIS — M9901 Segmental and somatic dysfunction of cervical region: Secondary | ICD-10-CM | POA: Diagnosis not present

## 2022-07-04 DIAGNOSIS — G5632 Lesion of radial nerve, left upper limb: Secondary | ICD-10-CM | POA: Diagnosis not present

## 2022-07-11 DIAGNOSIS — G5632 Lesion of radial nerve, left upper limb: Secondary | ICD-10-CM | POA: Diagnosis not present

## 2022-07-12 DIAGNOSIS — M9905 Segmental and somatic dysfunction of pelvic region: Secondary | ICD-10-CM | POA: Diagnosis not present

## 2022-07-12 DIAGNOSIS — M955 Acquired deformity of pelvis: Secondary | ICD-10-CM | POA: Diagnosis not present

## 2022-07-12 DIAGNOSIS — M5033 Other cervical disc degeneration, cervicothoracic region: Secondary | ICD-10-CM | POA: Diagnosis not present

## 2022-07-12 DIAGNOSIS — M9901 Segmental and somatic dysfunction of cervical region: Secondary | ICD-10-CM | POA: Diagnosis not present

## 2022-07-25 DIAGNOSIS — S81811A Laceration without foreign body, right lower leg, initial encounter: Secondary | ICD-10-CM | POA: Diagnosis not present

## 2022-07-31 DIAGNOSIS — L03115 Cellulitis of right lower limb: Secondary | ICD-10-CM | POA: Diagnosis not present

## 2022-08-02 DIAGNOSIS — L97922 Non-pressure chronic ulcer of unspecified part of left lower leg with fat layer exposed: Secondary | ICD-10-CM | POA: Diagnosis not present

## 2022-08-02 DIAGNOSIS — L03115 Cellulitis of right lower limb: Secondary | ICD-10-CM | POA: Diagnosis not present

## 2022-08-02 DIAGNOSIS — R600 Localized edema: Secondary | ICD-10-CM | POA: Diagnosis not present

## 2022-08-04 ENCOUNTER — Other Ambulatory Visit: Payer: Self-pay | Admitting: Infectious Diseases

## 2022-08-04 DIAGNOSIS — Z1231 Encounter for screening mammogram for malignant neoplasm of breast: Secondary | ICD-10-CM

## 2022-08-08 ENCOUNTER — Encounter: Payer: HMO | Attending: Physician Assistant | Admitting: Physician Assistant

## 2022-08-08 DIAGNOSIS — S81811A Laceration without foreign body, right lower leg, initial encounter: Secondary | ICD-10-CM | POA: Diagnosis not present

## 2022-08-08 DIAGNOSIS — X58XXXA Exposure to other specified factors, initial encounter: Secondary | ICD-10-CM | POA: Insufficient documentation

## 2022-08-08 DIAGNOSIS — I739 Peripheral vascular disease, unspecified: Secondary | ICD-10-CM | POA: Diagnosis not present

## 2022-08-08 DIAGNOSIS — I10 Essential (primary) hypertension: Secondary | ICD-10-CM | POA: Diagnosis not present

## 2022-08-08 DIAGNOSIS — L97812 Non-pressure chronic ulcer of other part of right lower leg with fat layer exposed: Secondary | ICD-10-CM | POA: Insufficient documentation

## 2022-08-09 DIAGNOSIS — S81801A Unspecified open wound, right lower leg, initial encounter: Secondary | ICD-10-CM | POA: Diagnosis not present

## 2022-08-09 NOTE — Progress Notes (Signed)
TORRENCE, HOLCK [name] ([id number]) [id number] Page 1 of 5 Visit Report for 08/08/2022 Abuse Risk Screen Details Patient Name: Date of Service: [name]. 08/08/2022 8:15 A M Medical Record Number: [id number] Patient Account Number: [id number] Date of Birth/Sex: Treating RN: 13-Mar-1936 (87 y.o. [name] Primary Care Jamyrah Saur: [name] Other Clinician: Referring Katrinka Herbison: Treating Malisa Ruggiero/Extender: Leanor Rubenstein in Treatment: 0 Abuse Risk Screen Items Answer ABUSE RISK SCREEN: Has anyone [name] to you tried to hurt or harm you recentlyo No Do you feel uncomfortable with anyone in your familyo No Has anyone forced you do things that you didnt want to doo No Electronic Signature(s) Signed: 08/08/2022 4:28:49 PM By: Levora Dredge Entered By: Levora Dredge on 08/08/2022 08:53:18 -------------------------------------------------------------------------------- Activities of Daily Living Details Patient Name: Date of Service: [name] 08/08/2022 8:15 A M Medical Record Number: [id number] Patient Account Number: [id number] Date of Birth/Sex: Treating RN: 1935/09/13 (87 y.o. [name] Primary Care Newman Waren: [name] Other Clinician: Referring Jashawn Floyd: Treating Malissie Musgrave/Extender: Leanor Rubenstein in Treatment: 0 Activities of Daily Living Items Answer Activities of Daily Living (Please select one for each item) Drive Automobile Completely Able T Medications ake Completely Able Use T elephone Completely Able Care for Appearance Completely Able Use T oilet Completely Able Bath / Shower Completely Able Dress Self Completely Able Feed Self Completely Able Walk Completely Able Get In / Out Bed Completely Able Housework Completely [name], [name] ([id number]) [phone] [id number] Page 2 of 5 Prepare Meals Completely  Able Handle Money Completely Able Shop for Self Completely Able Electronic Signature(s) Signed: 08/08/2022 4:28:49 PM By: Levora Dredge Entered By: Levora Dredge on 08/08/2022 08:53:42 -------------------------------------------------------------------------------- Education Screening Details Patient Name: Date of Service: Brooke Husbands [name]. 08/08/2022 8:15 A M Medical Record Number: [id number] Patient Account Number: [id number] Date of Birth/Sex: Treating RN: 1936-02-24 (87 y.o. [name] Primary Care Annina Piotrowski: [name] Other Clinician: Referring Jayzon Taras: Treating Welden Hausmann/Extender: Leanor Rubenstein in Treatment: 0 Learning Preferences/Education Level/Primary Language Learning Preference: Explanation, Demonstration, Video, Communication Board, Printed Material Preferred Language: English Cognitive Barrier Language Barrier: No Translator Needed: No Memory Deficit: No Emotional Barrier: No Cultural/Religious Beliefs Affecting Medical Care: No Physical Barrier Impaired Vision: Yes Glasses Impaired Hearing: No Decreased Hand dexterity: No Knowledge/Comprehension Knowledge Level: High Comprehension Level: High Ability to understand written instructions: High Ability to understand verbal instructions: High Motivation Anxiety Level: Calm Cooperation: Cooperative Education Importance: Acknowledges Need Interest in Health Problems: Asks Questions Perception: Coherent Willingness to Engage in Self-Management High Activities: Readiness to Engage in Self-Management High Activities: Electronic Signature(s) Signed: 08/08/2022 4:28:49 PM By: Levora Dredge Entered By: Levora Dredge on 08/08/2022 08:54:04 [name] ([id number]) [id number] Page 3 of 5 -------------------------------------------------------------------------------- Fall Risk Assessment Details Patient Name: Date of Service: [name]. 08/08/2022 8:15 A M Medical Record Number: [id number] Patient Account Number: [id number] Date of Birth/Sex: Treating RN: 03-03-1936 (87 y.o. [name] Primary Care Keysi Oelkers: [name] Other Clinician: Referring Maclean Foister: Treating Karlie Aung/Extender: Leanor Rubenstein in Treatment: 0 Fall Risk Assessment Items Have you had 2 or more falls in the last 12 monthso 0 No Have you had any fall that resulted in injury in the last 12 monthso 0 No FALLS RISK SCREEN History of falling - immediate or within 3 months 0 No Secondary diagnosis (Do you have 2 or more medical diagnoseso) 0 No Ambulatory aid None/bed rest/wheelchair/nurse 0 Yes  Crutches/cane/walker 0 No Furniture 0 No Intravenous therapy Access/Saline/Heparin Lock 0 No Gait/Transferring Normal/ bed rest/ wheelchair 0 Yes Weak (short steps with or without shuffle, stooped but able to lift head while walking, may seek 0 No support from furniture) Impaired (short steps with shuffle, may have difficulty arising from chair, head down, impaired 0 No balance) Mental Status Oriented to own ability 0 Yes Electronic Signature(s) Signed: 08/08/2022 4:28:49 PM By: Levora Dredge Entered By: Levora Dredge on 08/08/2022 08:54:20 -------------------------------------------------------------------------------- Foot Assessment Details Patient Name: Date of Service: Brooke Husbands [name]. 08/08/2022 8:15 A M Medical Record Number: [id number] Patient Account Number: [id number] Date of Birth/Sex: Treating RN: [birth year] (87 y.o. [name] Primary Care [name]: [name] Other Clinician: Referring Raena Pau: Treating Magdalen Cabana/Extender: Leanor Rubenstein in Treatment: 0 Foot Assessment Items Site Locations [employer] [place] [name] ([id number]) [id number] Page 4 of 5 + = Sensation present, - = Sensation absent, C = Callus, U = Ulcer [name]  = Redness, W = Warmth, M = Maceration, PU = Pre-ulcerative lesion F = Fissure, S = Swelling, D = Dryness Assessment Right: Left: Other Deformity: No No Prior Foot Ulcer: No No Prior Amputation: No No Charcot Joint: No No Ambulatory Status: Ambulatory Without Help Gait: Steady Electronic Signature(s) Signed: 08/08/2022 4:28:49 PM By: Levora Dredge Entered By: Levora Dredge on 08/08/2022 08:55:41 -------------------------------------------------------------------------------- Nutrition Risk Screening Details Patient Name: Date of Service: [name] 08/08/2022 8:15 A M Medical Record Number: [id number] Patient Account Number: [id number] Date of Birth/Sex: Treating RN: 1936-04-21 (87 y.o. [name] Primary Care [name]: [name] Other Clinician: Referring Khaleem Burchill: Treating Reid Regas/Extender: Leanor Rubenstein in Treatment: 0 Height (in): 69 Weight (lbs): 194 Body Mass Index (BMI): 28.6 Nutrition Risk Screening Items Score Screening NUTRITION RISK SCREEN: I have an illness or condition that made me change the kind and/or amount of food I eat 0 No I eat fewer than two meals per day 0 No I eat few fruits and vegetables, or milk products 0 No I have three or more drinks of beer, liquor or wine almost every day 0 No I have tooth or mouth problems that make it hard for me to eat 0 No I don't always have enough money to buy the food I need 0 No [name], [name] ([id number]) Herbert Deaner [id number] Page 5 of 5 I eat alone most of the time 0 No I take three or more different prescribed or over-the-counter drugs a day 0 No Without wanting to, I have lost or gained 10 pounds in the last six months 0 No I am not always physically able to shop, cook and/or feed myself 0 No Nutrition Protocols Good Risk Protocol 0 No interventions needed Moderate Risk Protocol High Risk Proctocol Risk Level: Good Risk Score:  0 Electronic Signature(s) Signed: 08/08/2022 4:28:49 PM By: Levora Dredge Entered By: Levora Dredge on 08/08/2022 08:54:31

## 2022-08-09 NOTE — Progress Notes (Signed)
BILLIE-JO, RIDGLEY ([id number]) [id number] Page 1 of 10 Visit Report for 08/08/2022 Chief Complaint Document Details Patient Name: Date of Service: [name] 08/08/2022 8:15 A M Medical Record Number: [id number] Patient Account Number: [id number] Date of Birth/Sex: Treating RN: [birth year] (87 y.o. [name] Primary Care Provider: Adrian Prows Other Clinician: Referring Provider: Treating Provider/Extender: Leanor Rubenstein in Treatment: 0 Information Obtained from: Patient Chief Complaint Skin tear right leg 07/24/22 Electronic Signature(s) Signed: 08/08/2022 9:20:34 AM By: Worthy Keeler PA-C Entered By: Worthy Keeler on 08/08/2022 09:20:33 -------------------------------------------------------------------------------- Debridement Details Patient Name: Date of Service: [name]. 08/08/2022 8:15 A M Medical Record Number: [id number] Patient Account Number: [id number] Date of Birth/Sex: Treating RN: [name] (87 y.o. [name] Primary Care Provider: Adrian Prows Other Clinician: Referring Provider: Treating Provider/Extender: Leanor Rubenstein in Treatment: 0 Debridement Performed for Assessment: Wound #1 Right Lower Leg Performed By: Physician Tommie Sams., PA-C Debridement Type: Debridement Level of Consciousness (Pre-procedure): Awake and Alert Pre-procedure Verification/Time Out Yes - 09:23 Taken: Start Time: 09:23 Pain Control: Lidocaine 4% T opical Solution T Area Debrided (L x W): otal 3.1 (cm) x 2.9 (cm) = 8.99 (cm) Tissue and other material debrided: Viable, Non-Viable, Eschar, Slough, Subcutaneous, Skin: Dermis , Skin: Epidermis, Slough Level: Skin/Subcutaneous Tissue Debridement Description: Excisional Instrument: Curette Bleeding: Minimum Hemostasis Achieved: Pressure Response to Treatment: Procedure was tolerated well Level of Consciousness  (Post- Awake and Alert procedure): [name], [name] ([id number]) [id number] Page 2 of 10 Post Debridement Measurements of Total Wound Length: (cm) 3.1 Width: (cm) 2.9 Depth: (cm) 0.1 Volume: (cm) 0.706 Character of Wound/Ulcer Post Debridement: Stable Post Procedure Diagnosis Same as Pre-procedure Electronic Signature(s) Signed: 08/08/2022 4:28:49 PM By: Levora Dredge Signed: 08/08/2022 6:19:02 PM By: Worthy Keeler PA-C Entered By: Levora Dredge on 08/08/2022 09:26:02 -------------------------------------------------------------------------------- HPI Details Patient Name: Date of Service: [name]. 08/08/2022 8:15 A M Medical Record Number: [id number] Patient Account Number: [id number] Date of Birth/Sex: Treating RN: [name] (87 y.o. [name] Primary Care Provider: Adrian Prows Other Clinician: Referring Provider: Treating Provider/Extender: Leanor Rubenstein in Treatment: 0 History of Present Illness HPI Description: 08-08-2022 upon evaluation today patient appears for initial inspection here in our clinic concerning issues that she has been having with a wound on her right anterior lower leg where she dropped a shredder on her leg causing a skin tear. Unfortunately this was attempted to be reapproximated but that has not been extremely effective for her. Fortunately there does not appear to be any signs of infection systemically which is great news. With that being said there is local infection definitely noted at this point. She has been on antibiotics. Currently cefdinir and doxycycline where the 2 antibiotics that were used up to this point. This original injury occurred on March 4. Her culture showed Enterococcus faecium and that was on 08-06-2022 that was resulted. Her primary care provider Dr. Ola Spurr who is also an infectious disease specialist that stated that if she was not better at the end  of her current cefdinir and doxycycline regimen that he would likely put her on Augmentin or amoxicillin. I think we may want to switch her to Augmentin at this point. Patient has a history of hypertension and peripheral vascular disease but really no other major medical problems at this point. With that being said although she did not show any signs of infection at this point I  feel like this is something that might heal readily. However because of the screen showing PAD here in the clinic on the ABIs if she does not show signs of improvement over the next that I think we may want to go ahead and see about getting her to vascular. Further evaluation and treatment. Will however see how things do over the next week. Electronic Signature(s) Signed: 08/09/2022 10:36:29 AM By: Worthy Keeler PA-C Previous Signature: 08/09/2022 10:36:15 AM Version By: Worthy Keeler PA-C Previous Signature: 08/08/2022 6:11:31 PM Version By: Worthy Keeler PA-C Entered By: Worthy Keeler on 08/09/2022 10:36:29 Physical Exam Details -------------------------------------------------------------------------------- [name] ([id number]) [id number] Page 3 of 10 Patient Name: Date of Service: [name] 08/08/2022 8:15 A M Medical Record Number: [id number] Patient Account Number: [id number] Date of Birth/Sex: Treating RN: [birth year] [name] (87 y.o. [name] Primary Care Provider: Adrian Prows Other Clinician: Referring Provider: Treating Provider/Extender: Leanor Rubenstein in Treatment: 0 Constitutional Well-nourished and well-hydrated in no acute distress. Respiratory normal breathing without difficulty. Psychiatric this patient is able to make decisions and demonstrates good insight into disease process. Alert and Oriented x 3. pleasant and cooperative. Notes Upon inspection patient's wound bed actually showed signs of good granulation and  epithelization at this point. Fortunately I do not see any evidence of active infection locally nor systemically which is great news and overall I am extremely pleased with where we stand currently. I did perform debridement clearway some of the necrotic debris that was on the surface of the wound she tolerated that today without complication. Postdebridement wound bed actually appears to be doing better there does appear to be some pretty good blood flow hopefully sufficient to be able to heal this wound. However if we find that is not the case and this seems to not be making his good progress in the next week or 2 compared to what we would like to see that I will see about making a referral to vascular for further evaluation and therapy. Electronic Signature(s) Signed: 08/09/2022 10:37:04 AM By: Worthy Keeler PA-C Entered By: Worthy Keeler on 08/09/2022 10:37:04 -------------------------------------------------------------------------------- Physician Orders Details Patient Name: Date of Service: [name]. 08/08/2022 8:15 A M Medical Record Number: [id number] Patient Account Number: [id number] Date of Birth/Sex: Treating RN: [name] (87 y.o. [name] Primary Care Provider: Adrian Prows Other Clinician: Referring Provider: Treating Provider/Extender: Leanor Rubenstein in Treatment: 0 Verbal / Phone Orders: No Diagnosis Coding ICD-10 Coding Code Description [phone] Laceration without foreign body, right lower leg, initial encounter L97.812 Non-pressure chronic ulcer of other part of right lower leg with fat layer exposed I73.89 Other specified peripheral vascular diseases I10 Essential (primary) hypertension Follow-up Appointments Return Appointment in 1 week. Bathing/ Shower/ Hygiene May shower; gently cleanse wound with antibacterial soap, rinse and pat dry prior to dressing wounds No tub bath. Anesthetic (Use 'Patient Medications'  Section for Anesthetic Order Entry) Wound #1 Right Lower Leg Lidocaine applied to wound bed Edema Control - Lymphedema / Segmental Compressive Device / Other Tubigrip single layer applied. - size [name], [name] ([id number]) [id number] Page 4 of 10 Elevate, Exercise Daily and A void Standing for Long Periods of Time. Elevate legs to the level of the heart and pump ankles as often as possible Elevate leg(s) parallel to the floor when sitting. DO YOUR BEST to sleep in the bed at night. DO NOT sleep in your recliner. Long  hours of sitting in a recliner leads to swelling of the legs and/or potential wounds on your backside. Medications-Please add to medication list. ntibiotics - finish antibiotics as prescribed P.O. A Wound Treatment Wound #1 - Lower Leg Wound Laterality: Right Cleanser: Byram Ancillary Kit - 15 Day Supply (DME) (Generic) 3 x Per Week/30 Days Discharge Instructions: Use supplies as instructed; Kit contains: (15) Saline Bullets; (15) 3x3 Gauze; 15 pr Gloves Cleanser: Soap and Water 3 x Per Week/30 Days Discharge Instructions: Gently cleanse wound with antibacterial soap, rinse and pat dry prior to dressing wounds Cleanser: Vashe 5.8 (oz) 3 x Per Week/30 Days Discharge Instructions: Use vashe 5.8 (oz) as directed Prim Dressing: IODOFLEX 0.9% Cadexomer Iodine Pad 3 x Per Week/30 Days ary Discharge Instructions: Apply Iodoflex to wound bed only as directed. Secondary Dressing: (BORDER) Zetuvit Plus SILICONE BORDER Dressing 4x4 (in/in) (DME) (Generic) 3 x Per Week/30 Days Discharge Instructions: Please do not put silicone bordered dressings under wraps. Use non-bordered dressing only. Secured With: Tubigrip Size D, 3x10 (in/yd) 3 x Per Week/30 Days Discharge Instructions: single layer Patient Medications llergies: tramadol, acetaminophen A Notifications Medication Indication Start End 08/08/2022 amoxicillin-pot clavulanate DOSE 1 - oral 875  mg-125 mg tablet - 1 tablet oral twice a day x 14 days Electronic Signature(s) Signed: 08/08/2022 6:12:53 PM By: Worthy Keeler PA-C Previous Signature: 08/08/2022 4:28:49 PM Version By: Levora Dredge Entered By: Worthy Keeler on 08/08/2022 18:12:53 -------------------------------------------------------------------------------- Problem List Details Patient Name: Date of Service: Brooke Husbands [name]. 08/08/2022 8:15 A M Medical Record Number: [id number] Patient Account Number: [id number] Date of Birth/Sex: Treating RN: [birth year] (87 y.o. [name] Primary Care Provider: Adrian Prows Other Clinician: Referring Provider: Treating Provider/Extender: Leanor Rubenstein in Treatment: 0 Active Problems ICD-10 Encounter Code Description Active Date MDM Diagnosis S81.811A Laceration without foreign body, right lower leg, initial encounter 08/08/2022 No Yes [name], [name] ([id number]) [id number] Page 5 of 10 [phone] Non-pressure chronic ulcer of other part of right lower leg with fat layer 08/08/2022 No Yes exposed I73.89 Other specified peripheral vascular diseases 08/08/2022 No Yes I10 Essential (primary) hypertension 08/08/2022 No Yes Inactive Problems Resolved Problems Electronic Signature(s) Signed: 08/08/2022 9:20:06 AM By: Worthy Keeler PA-C Entered By: Worthy Keeler on 08/08/2022 09:20:06 -------------------------------------------------------------------------------- Progress Note Details Patient Name: Date of Service: [name]. 08/08/2022 8:15 A M Medical Record Number: [id number] Patient Account Number: [id number] Date of Birth/Sex: Treating RN: Brooke [birth year], [name] (87 y.o. [name] Primary Care Provider: Adrian Prows Other Clinician: Referring Provider: Treating Provider/Extender: Leanor Rubenstein in Treatment: 0 Subjective Chief Complaint Information obtained from  Patient Skin tear right leg 07/24/22 History of Present Illness (HPI) 08-08-2022 upon evaluation today patient appears for initial inspection here in our clinic concerning issues that she has been having with a wound on her right anterior lower leg where she dropped a shredder on her leg causing a skin tear. Unfortunately this was attempted to be reapproximated but that has not been extremely effective for her. Fortunately there does not appear to be any signs of infection systemically which is great news. With that being said there is local infection definitely noted at this point. She has been on antibiotics. Currently cefdinir and doxycycline where the 2 antibiotics that were used up to this point. This original injury occurred on March 4. Her culture showed Enterococcus faecium and that was on 08-06-2022 that was resulted. Her primary care provider Dr. Ola Spurr who is  also an infectious disease specialist that stated that if she was not better at the end of her current cefdinir and doxycycline regimen that he would likely put her on Augmentin or amoxicillin. I think we may want to switch her to Augmentin at this point. Patient has a history of hypertension and peripheral vascular disease but really no other major medical problems at this point. With that being said although she did not show any signs of infection at this point I feel like this is something that might heal readily. However because of the screen showing PAD here in the clinic on the ABIs if she does not show signs of improvement over the next that I think we may want to go ahead and see about getting her to vascular. Further evaluation and treatment. Will however see how things do over the next week. Patient History Information obtained from Patient. Allergies tramadol (Reaction: nausea), acetaminophen (Reaction: nausea) Social History Never smoker, Alcohol Use - Never, Drug Use - No History, Caffeine Use - Daily. Medical  History Hematologic/Lymphatic Patient has history of Anemia Cardiovascular [name], [name] ([id number]) [id number] Page 6 of 10 Patient has history of Hypertension Musculoskeletal Patient has history of Osteoarthritis Review of Systems (ROS) Constitutional Symptoms (General Health) Denies complaints or symptoms of Fatigue, Fever, Chills, Marked Weight Change. Eyes Complains or has symptoms of Glasses / Contacts. Ear/Nose/Mouth/Throat Denies complaints or symptoms of Difficult clearing ears, Sinusitis. Hematologic/Lymphatic Denies complaints or symptoms of Bleeding / Clotting Disorders, Human Immunodeficiency Virus. Respiratory Denies complaints or symptoms of Chronic or frequent coughs, Shortness of Breath. Gastrointestinal GERD Endocrine Denies complaints or symptoms of Hepatitis, Thyroid disease, Polydypsia (Excessive Thirst). Genitourinary Denies complaints or symptoms of Kidney failure/ Dialysis, Incontinence/dribbling. Immunological Denies complaints or symptoms of Hives, Itching. Integumentary (Skin) Complains or has symptoms of Swelling. Denies complaints or symptoms of Wounds, Bleeding or bruising tendency, Breakdown. Musculoskeletal Complains or has symptoms of Muscle Pain, Muscle Weakness - left upper extremity, arthritis bilateral knee replacement Neurologic Complains or has symptoms of Numbness/parasthesias - left upper extremity, CVA Oncologic basal cell carcinoma Psychiatric Denies complaints or symptoms of Anxiety, Claustrophobia. Objective Constitutional Well-nourished and well-hydrated in no acute distress. Vitals Time Taken: 8:26 AM, Height: 69 in, Source: Stated, Weight: 194 lbs, Source: Stated, BMI: 28.6, Temperature: 97.6 F, Pulse: 75 bpm, Respiratory Rate: 18 breaths/min, Blood Pressure: 122/73 mmHg. Respiratory normal breathing without difficulty. Psychiatric this patient is able to make decisions and demonstrates  good insight into disease process. Alert and Oriented x 3. pleasant and cooperative. General Notes: Upon inspection patient's wound bed actually showed signs of good granulation and epithelization at this point. Fortunately I do not see any evidence of active infection locally nor systemically which is great news and overall I am extremely pleased with where we stand currently. I did perform debridement clearway some of the necrotic debris that was on the surface of the wound she tolerated that today without complication. Postdebridement wound bed actually appears to be doing better there does appear to be some pretty good blood flow hopefully sufficient to be able to heal this wound. However if we find that is not the case and this seems to not be making his good progress in the next week or 2 compared to what we would like to see that I will see about making a referral to vascular for further evaluation and therapy. Integumentary (Hair, Skin) Wound #1 status is Open. Original cause of wound was Trauma. The date acquired was: 07/24/2022. The wound  is located on the Right Lower Leg. The wound measures 3.1cm length x 2.9cm width x 0.1cm depth; 7.061cm^2 area and 0.706cm^3 volume. There is Fat Layer (Subcutaneous Tissue) exposed. There is no tunneling or undermining noted. There is a medium amount of serosanguineous drainage noted. There is small (1-33%) red, pink granulation within the wound bed. There is a large (67-100%) amount of necrotic tissue within the wound bed including Eschar and Adherent Slough. Assessment Active Problems ICD-10 Laceration without foreign body, right lower leg, initial encounter Non-pressure chronic ulcer of other part of right lower leg with fat layer exposed Other specified peripheral vascular diseases Essential (primary) hypertension [name], [name] ([id number]) [id number] Page 7 of 10 Procedures Wound #1 Pre-procedure diagnosis of  Wound #1 is a Skin T located on the Right Lower Leg . There was a Excisional Skin/Subcutaneous Tissue Debridement with a ear total area of 8.99 sq cm performed by Tommie Sams., PA-C. With the following instrument(s): Curette to remove Viable and Non-Viable tissue/material. Material removed includes Eschar, Subcutaneous Tissue, Slough, Skin: Dermis, and Skin: Epidermis after achieving pain control using Lidocaine 4% T opical Solution. No specimens were taken. A time out was conducted at 09:23, prior to the start of the procedure. A Minimum amount of bleeding was controlled with Pressure. The procedure was tolerated well. Post Debridement Measurements: 3.1cm length x 2.9cm width x 0.1cm depth; 0.706cm^3 volume. Character of Wound/Ulcer Post Debridement is stable. Post procedure Diagnosis Wound #1: Same as Pre-Procedure Plan Follow-up Appointments: Return Appointment in 1 week. Bathing/ Shower/ Hygiene: May shower; gently cleanse wound with antibacterial soap, rinse and pat dry prior to dressing wounds No tub bath. Anesthetic (Use 'Patient Medications' Section for Anesthetic Order Entry): Wound #1 Right Lower Leg: Lidocaine applied to wound bed Edema Control - Lymphedema / Segmental Compressive Device / Other: Tubigrip single layer applied. - size D Elevate, Exercise Daily and Avoid Standing for Long Periods of Time. Elevate legs to the level of the heart and pump ankles as often as possible Elevate leg(s) parallel to the floor when sitting. DO YOUR BEST to sleep in the bed at night. DO NOT sleep in your recliner. Long hours of sitting in a recliner leads to swelling of the legs and/or potential wounds on your backside. Medications-Please add to medication list.: P.O. Antibiotics - finish antibiotics as prescribed The following medication(s) was prescribed: amoxicillin-pot clavulanate oral 875 mg-125 mg tablet 1 1 tablet oral twice a day x 14 days starting 08/08/2022 WOUND #1: - Lower Leg  Wound Laterality: Right Cleanser: Byram Ancillary Kit - 15 Day Supply (DME) (Generic) 3 x Per Week/30 Days Discharge Instructions: Use supplies as instructed; Kit contains: (15) Saline Bullets; (15) 3x3 Gauze; 15 pr Gloves Cleanser: Soap and Water 3 x Per Week/30 Days Discharge Instructions: Gently cleanse wound with antibacterial soap, rinse and pat dry prior to dressing wounds Cleanser: Vashe 5.8 (oz) 3 x Per Week/30 Days Discharge Instructions: Use vashe 5.8 (oz) as directed Prim Dressing: IODOFLEX 0.9% Cadexomer Iodine Pad 3 x Per Week/30 Days ary Discharge Instructions: Apply Iodoflex to wound bed only as directed. Secondary Dressing: (BORDER) Zetuvit Plus SILICONE BORDER Dressing 4x4 (in/in) (DME) (Generic) 3 x Per Week/30 Days Discharge Instructions: Please do not put silicone bordered dressings under wraps. Use non-bordered dressing only. Secured With: Tubigrip Size D, 3x10 (in/yd) 3 x Per Week/30 Days Discharge Instructions: single layer 1. I am going to recommend currently that we go ahead and initiate treatment with Iodosorb which I think is good  to be a good option here for her. 2. I am also can recommend that we have the patient continue with the Vashe to clean this with which I think is doing a good job. 3. I am also can recommend that we use the Tubigrip size D to help with a little bit of the edema control. I do not think this is too tight and should not cause any problems but will help with some minimal swelling that she does have noted at this point. We will see patient back for reevaluation in 1 week here in the clinic. If anything worsens or changes patient will contact our office for additional recommendations. If she does not see significant improvement over the next week then I will consider proceeding with a referral to vascular for evaluation and potential treatment at least at minimum a formal arterial study. Electronic Signature(s) Signed: 08/09/2022 10:38:02 AM By:  Worthy Keeler PA-C Entered By: Worthy Keeler on 08/09/2022 10:38:02 [name] ([id number]) [id number] Page 8 of 10 -------------------------------------------------------------------------------- ROS/PFSH Details Patient Name: Date of Service: [name] 08/08/2022 8:15 A M Medical Record Number: [id number] Patient Account Number: [id number] Date of Birth/Sex: Treating RN: [birth year] [name] (87 y.o. [name] Primary Care Provider: Adrian Prows Other Clinician: Referring Provider: Treating Provider/Extender: Leanor Rubenstein in Treatment: 0 Information Obtained From Patient Constitutional Symptoms (General Health) Complaints and Symptoms: Negative for: Fatigue; Fever; Chills; Marked Weight Change Eyes Complaints and Symptoms: Positive for: Glasses / Contacts Ear/Nose/Mouth/Throat Complaints and Symptoms: Negative for: Difficult clearing ears; Sinusitis Hematologic/Lymphatic Complaints and Symptoms: Negative for: Bleeding / Clotting Disorders; Human Immunodeficiency Virus Medical History: Positive for: Anemia Respiratory Complaints and Symptoms: Negative for: Chronic or frequent coughs; Shortness of Breath Endocrine Complaints and Symptoms: Negative for: Hepatitis; Thyroid disease; Polydypsia (Excessive Thirst) Genitourinary Complaints and Symptoms: Negative for: Kidney failure/ Dialysis; Incontinence/dribbling Immunological Complaints and Symptoms: Negative for: Hives; Itching Integumentary (Skin) Complaints and Symptoms: Positive for: Swelling Negative for: Wounds; Bleeding or bruising tendency; Breakdown Musculoskeletal Complaints and Symptoms: Positive for: Muscle Pain; Muscle Weakness - left upper extremity Review of System Notes: arthritis bilateral knee replacement Medical History: Positive for: Osteoarthritis Neurologic Complaints and Symptoms: Positive for: Numbness/parasthesias  - left upper extremity Review of System Notes: CVA [name], [name] ([id number]) [id number] Page 9 of 10 Psychiatric Complaints and Symptoms: Negative for: Anxiety; Claustrophobia Cardiovascular Medical History: Positive for: Hypertension Gastrointestinal Complaints and Symptoms: Review of System Notes: GERD Oncologic Complaints and Symptoms: Review of System Notes: basal cell carcinoma Immunizations Pneumococcal Vaccine: Received Pneumococcal Vaccination: Yes Received Pneumococcal Vaccination On or After 60th Birthday: Yes Implantable Devices None Family and Social History Never smoker; Alcohol Use: Never; Drug Use: No History; Caffeine Use: Daily Electronic Signature(s) Signed: 08/08/2022 4:28:49 PM By: Levora Dredge Signed: 08/08/2022 6:19:02 PM By: Worthy Keeler PA-C Entered By: Levora Dredge on 08/08/2022 09:08:25 -------------------------------------------------------------------------------- SuperBill Details Patient Name: Date of Service: Brooke Husbands [name]. 08/08/2022 Medical Record Number: [id number] Patient Account Number: [id number] Date of Birth/Sex: Treating RN: [birth year] [name] (87 y.o. [name] Primary Care Provider: Adrian Prows Other Clinician: Referring Provider: Treating Provider/Extender: Leanor Rubenstein in Treatment: 0 Diagnosis Coding ICD-10 Codes Code Description [phone] Laceration without foreign body, right lower leg, initial encounter L97.812 Non-pressure chronic ulcer of other part of right lower leg with fat layer exposed I73.89 Other specified peripheral vascular diseases I10 Essential (primary) hypertension Facility Procedures : [name] Code: [id number] , [name] [id number] Description: [id number] -  WOUND CARE VISIT-LEV 4 EST PT 2365) [id number] Modifier: [id number] Quantity: 1 [id number] Page 10 of 10 : CPT4 Code: [id number] Description: [id number] - DEB SUBQ TISSUE 20  SQ CM/< ICD-10 Diagnosis Description [id number] Non-pressure chronic ulcer of other part of right lower leg with fat layer e Modifier: xposed Quantity: 1 Physician Procedures : CPT4 Code Description Modifier [id number] - WC PHYS LEVEL 4 - NEW PT 25 ICD-10 Diagnosis Description [id number] Laceration without foreign body, right lower leg, initial encounter L97.812 Non-pressure chronic ulcer of other part of right lower leg  with fat layer exposed I73.89 Other specified peripheral vascular diseases I10 Essential (primary) hypertension Quantity: 1 : [id number] - WC PHYS SUBQ TISS 20 SQ CM ICD-10 Diagnosis Description [id number] Non-pressure chronic ulcer of other part of right lower leg with fat layer exposed Quantity: 1 Electronic Signature(s) Signed: 08/08/2022 6:13:42 PM By: Worthy Keeler PA-C Entered By: Worthy Keeler on 08/08/2022 18:13:42

## 2022-08-09 NOTE — Progress Notes (Signed)
Brooke, [name] ([id number]) [id number] Page 1 of 9 Visit Report for 08/08/2022 Allergy List Details Patient Name: Date of Service: [name]. 08/08/2022 8:15 A M Medical Record Number: [id number] Patient Account Number: [id number] Date of Birth/Sex: Treating RN: 1935-10-21 (87 y.o. Valetta [name] Primary Care [name]: [name] [name]: Referring [name]: Treating [name]/Extender: [name] in Treatment: 0 Allergies Active Allergies tramadol Reaction: nausea acetaminophen Reaction: nausea Allergy Notes Electronic Signature(s) Signed: 08/08/2022 4:28:49 PM By: Levora Dredge Entered By: Levora Dredge on 08/08/2022 08:49:58 -------------------------------------------------------------------------------- Arrival Information Details Patient Name: Date of Service: [name] [name]. 08/08/2022 8:15 A M Medical Record Number: [id number] Patient Account Number: [id number] Date of Birth/Sex: Treating RN: 1936/03/03 (87 y.o. Valetta [name] Primary Care Ermal Haberer: [name] [name]: Referring [name]: Treating [name]/Extender: [name] in Treatment: 0 Visit Information Patient Arrived: Ambulatory Arrival Time: 08:25 Accompanied By: self Transfer Assistance: None Patient Identification Verified: Yes Secondary Verification Process Completed: Yes Patient Has Alerts: Yes Patient Alerts: Patient on Blood Thinner 08/08/22 ABI Right PAD 08/08/22 ABI Left PAD AJAYSIA, TOMASI [name] ([id number]) [id number] Page 2 of 9 Electronic Signature(s) Signed: 08/08/2022 2:03:49 PM By: Levora Dredge Entered By: Levora Dredge on 08/08/2022 14:03:49 -------------------------------------------------------------------------------- Clinic Level of Care Assessment Details Patient Name: Date of Service: [name] 08/08/2022 8:15  A M Medical Record Number: [id number] Patient Account Number: [id number] Date of Birth/Sex: Treating RN: [birth year] (87 y.o. Valetta [name] Primary Care Cordelro Gautreau: [name] [name]: Referring [name]: Treating [name]/Extender: [name] in Treatment: 0 Clinic Level of Care Assessment Items TOOL 3 Quantity Score []  - 0 Use when EandM and Procedure is performed on FOLLOW-UP visit ASSESSMENTS - Nursing Assessment / Reassessment X- 1 10 Reassessment of Co-morbidities (includes updates in patient status) X- 1 5 Reassessment of Adherence to Treatment Plan ASSESSMENTS - Wound and Skin Assessment / Reassessment []  - Points for Wound Assessment can only be taken for a new wound of unknown or different etiology and a procedure is 0 NOT performed to that wound X- 1 5 Simple Wound Assessment / Reassessment - one wound []  - 0 Complex Wound Assessment / Reassessment - multiple wounds []  - 0 Dermatologic / Skin Assessment (not related to wound area) ASSESSMENTS - Focused Assessment X- 1 5 Circumferential Edema Measurements - multi extremities []  - 0 Nutritional Assessment / Counseling / Intervention X- 1 5 Lower Extremity Assessment (monofilament, tuning fork, pulses) []  - 0 Peripheral Arterial Disease Assessment (using hand held doppler) ASSESSMENTS - Ostomy and/or Continence Assessment and Care []  - 0 Incontinence Assessment and Management []  - 0 Ostomy Care Assessment and Management (repouching, etc.) PROCESS - Coordination of Care []  - Points for Discharge Coordination can only be taken for a new wound of unknown or different etiology and a 0 procedure is NOT performed to that wound X- 1 15 Simple Patient / Family Education for ongoing care []  - 0 Complex (extensive) Patient / Family Education for ongoing care X- 1 10 Staff obtains Programmer, systems, Records, T Results / Process Orders est []  - 0 Staff telephones HHA, Nursing Homes /  Clarify orders / etc []  - 0 Routine Transfer to another Facility (non-emergent condition) []  - 0 Routine Hospital Admission (non-emergent condition) X- 1 15 New Admissions / Insurance Authorizations / Ordering NPWT Apligraf, etcWALTERINE, [name] ([id number]) [id number] Page 3 of 9 []  - 0 Emergency Hospital Admission (emergent condition) X- 1  10 Simple Discharge Coordination []  - 0 Complex (extensive) Discharge Coordination PROCESS - Special Needs []  - 0 Pediatric / Minor Patient Management []  - 0 Isolation Patient Management []  - 0 Hearing / Language / Visual special needs []  - 0 Assessment of Community assistance (transportation, D/C planning, etc.) []  - 0 Additional assistance / Altered mentation []  - 0 Support Surface(s) Assessment (bed, cushion, seat, etc.) INTERVENTIONS - Wound Cleansing / Measurement []  - Points for Wound Cleaning / Measurement, Wound Dressing, Specimen Collection and Specimen taken to lab can 0 only be taken for a new wound of unknown or different etiology and a procedure is NOT performed to that wound X- 1 5 Simple Wound Cleansing - one wound []  - 0 Complex Wound Cleansing - multiple wounds X- 1 5 Wound Imaging (photographs - any number of wounds) []  - 0 Wound Tracing (instead of photographs) X- 1 5 Simple Wound Measurement - one wound []  - 0 Complex Wound Measurement - multiple wounds INTERVENTIONS - Wound Dressings X - Small Wound Dressing one or multiple wounds 1 10 []  - 0 Medium Wound Dressing one or multiple wounds []  - 0 Large Wound Dressing one or multiple wounds INTERVENTIONS - Miscellaneous []  - 0 External ear exam []  - 0 Specimen Collection (cultures, biopsies, blood, body fluids, etc.) []  - 0 Specimen(s) / Culture(s) sent or taken to Lab for analysis []  - 0 Patient Transfer (multiple staff / Civil Service fast streamer / Similar devices) []  - 0 Simple Staple / Suture removal (25 or less) []  - 0 Complex  Staple / Suture removal (26 or more) []  - 0 Hypo / Hyperglycemic Management ([name] monitor of Blood Glucose) X- 1 15 Ankle / Brachial Index (ABI) - do not check if billed separately X- 1 5 Vital Signs Has the patient been seen at the hospital within the last three years: Yes Total Score: 125 Level Of Care: New/Established - Level 4 Electronic Signature(s) Signed: 08/08/2022 4:28:49 PM By: Levora Dredge Entered By: Levora Dredge on 08/08/2022 14:22:23 [name] ([id number]) [id number] Page 4 of 9 -------------------------------------------------------------------------------- Encounter Discharge Information Details Patient Name: Date of Service: [name] 08/08/2022 8:15 A M Medical Record Number: [id number] Patient Account Number: [id number] Date of Birth/Sex: Treating RN: January 31, 1936 (87 y.o. Valetta [name] Primary Care Mohannad Olivero: [name] [name]: Referring Adanna Zuckerman: Treating Kourosh Jablonsky/Extender: [name] in Treatment: 0 Encounter Discharge Information Items Post Procedure Vitals Discharge Condition: Stable Temperature (F): 97.6 Ambulatory Status: Ambulatory Pulse (bpm): 75 Discharge Destination: Home Respiratory Rate (breaths/min): 18 Transportation: Private Auto Blood Pressure (mmHg): 122/73 Accompanied By: self Schedule Follow-up Appointment: Yes Clinical Summary of Care: Electronic Signature(s) Signed: 08/08/2022 2:29:15 PM By: Levora Dredge Entered By: Levora Dredge on 08/08/2022 14:29:15 -------------------------------------------------------------------------------- Lower Extremity Assessment Details Patient Name: Date of Service: [name] 08/08/2022 8:15 A M Medical Record Number: [id number] Patient Account Number: [id number] Date of Birth/Sex: Treating RN: 09-16-1935 (87 y.o. Valetta [name] Primary Care Bhavik Cabiness: [name] Brooke  [name]: Referring Madhuri Vacca: Treating Shantia Sanford/Extender: [name] in Treatment: 0 Edema Assessment Assessed: [Left: No] [Right: No] Edema: [Left: Ye] [Right: s] Calf Left: Right: Point of Measurement: 34 cm From Medial Instep 44 cm Ankle Left: Right: Point of Measurement: 11 cm From Medial Instep 25.9 cm Knee To Floor Left: Right: From Medial Instep 43 cm [name], [name] ([id number]) [id number] Page 5 of 9 Vascular Assessment Pulses: Dorsalis Pedis Palpable: [Right:Yes] Posterior Tibial Palpable: [Right:Yes] Electronic Signature(s) Signed:  08/08/2022 4:28:49 PM By: Levora Dredge Entered By: Levora Dredge on 08/08/2022 08:48:32 -------------------------------------------------------------------------------- Multi-Disciplinary Care Plan Details Patient Name: Date of Service: [address] [place] [name]. 08/08/2022 8:15 A M Medical Record Number: [id number] Patient Account Number: [id number] Date of Birth/Sex: Treating RN: 11/25/1935 (87 y.o. Valetta [name] Primary Care Kaedynce Tapp: [name] [name]: Referring Bellarose Burtt: Treating Katiejo Gilroy/Extender: [name] in Treatment: 0 Active Inactive Medication Nursing Diagnoses: Knowledge deficit related to medication safety: actual or potential Goals: Patient/caregiver will demonstrate understanding of all current medications Date Initiated: 08/08/2022 T arget Resolution Date: 08/15/2022 Goal Status: Active Patient/caregiver will demonstrate understanding of new oral/IV medications prescribed at the [facility] (topical prescriptions are covered under the skin breakdown problem) Date Initiated: 08/08/2022 Target Resolution Date: 08/15/2022 Goal Status: Active Interventions: Assess patient/caregiver ability to manage medication regimen upon admission and as needed Treatment Activities: New medication prescribed at [place] :  08/08/2022 Notes: Orientation to the Wound Care Program Nursing Diagnoses: Knowledge deficit related to the wound healing center program Goals: Patient/caregiver will verbalize understanding of the [place] Date Initiated: 08/08/2022 Target Resolution Date: 08/15/2022 Goal Status: Active Interventions: Provide education on orientation to the wound center Notes: [name], [name] ([id number]) [id number] Page 6 of 9 Wound/Skin Impairment Nursing Diagnoses: Impaired tissue integrity Knowledge deficit related to ulceration/compromised skin integrity Goals: Ulcer/skin breakdown will have a volume reduction of 30% by week 4 Date Initiated: 08/08/2022 Target Resolution Date: 09/05/2022 Goal Status: Active Ulcer/skin breakdown will have a volume reduction of 50% by week 8 Date Initiated: 08/08/2022 Target Resolution Date: 10/03/2022 Goal Status: Active Ulcer/skin breakdown will have a volume reduction of 80% by week 12 Date Initiated: 08/08/2022 Target Resolution Date: 10/31/2022 Goal Status: Active Ulcer/skin breakdown will heal within 14 weeks Date Initiated: 08/08/2022 Target Resolution Date: 11/14/2022 Goal Status: Active Interventions: Assess patient/caregiver ability to obtain necessary supplies Assess patient/caregiver ability to perform ulcer/skin care regimen upon admission and as needed Assess ulceration(s) every visit Provide education on ulcer and skin care Treatment Activities: Skin care regimen initiated : 08/08/2022 Notes: Electronic Signature(s) Signed: 08/08/2022 2:26:35 PM By: Levora Dredge Entered By: Levora Dredge on 08/08/2022 14:26:35 -------------------------------------------------------------------------------- Pain Assessment Details Patient Name: Date of Service: [name] [name]. 08/08/2022 8:15 A M Medical Record Number: [id number] Patient Account Number: [id number] Date of Birth/Sex: Treating  RN: 03/27/1936 (87 y.o. Valetta [name] Primary Care Jenine Krisher: [name] [name]: Referring Velta Rockholt: Treating Noele Icenhour/Extender: [name] in Treatment: 0 Active Problems Location of Pain Severity and Description of Pain Patient Has Paino Yes Site Locations Rate the pain. [name], [name] ([id number]) [id number] Page 7 of 9 Rate the pain. Current Pain Level: 3 Pain Management and Medication Current Pain Management: Notes patient states pain in right lower leg Electronic Signature(s) Signed: 08/08/2022 4:28:49 PM By: Levora Dredge Entered By: Levora Dredge on 08/08/2022 08:25:57 -------------------------------------------------------------------------------- Patient/Caregiver Education Details Patient Name: Date of Service: [name] [name]. 3/19/2024andnbsp8:15 A M Medical Record Number: [id number] Patient Account Number: [id number] Date of Birth/Gender: Treating RN: 1935-09-24 (87 y.o. Valetta [name] Primary Care Physician: [name] [name]: Referring Physician: Treating Physician/Extender: [name] in Treatment: 0 Education Assessment Education Provided To: Patient Education Topics Provided Welcome T [facility]New Patient Packet: o Handouts: The Wound Healing Pledge form, Welcome T The [place] o Methods: Explain/Verbal Responses: State content correctly Wound Debridement: Handouts: Wound Debridement Methods: Explain/Verbal Responses: State content correctly Wound/Skin Impairment:  Handouts: Caring for Your Ulcer Methods: Explain/Verbal Responses: State content correctly [name], [name] ([id number]) [id number] Page 8 of 9 Electronic Signature(s) Signed: 08/08/2022 4:28:49 PM By: Levora Dredge Entered By: Levora Dredge on 08/08/2022  14:27:29 -------------------------------------------------------------------------------- Wound Assessment Details Patient Name: Date of Service: [name] [name]. 08/08/2022 8:15 A M Medical Record Number: [id number] Patient Account Number: [id number] Date of Birth/Sex: Treating RN: 03-27-1936 (87 y.o. Valetta [name] Primary Care Delberta Folts: [name] [name]: Referring Fumio Vandam: Treating Traniya Prichett/Extender: [name] in Treatment: 0 Wound Status Wound Number: 1 Primary Etiology: Skin Tear Wound Location: Right Lower Leg Wound Status: Open Wounding Event: Trauma Comorbid History: Anemia, Hypertension, Osteoarthritis Date Acquired: 07/24/2022 Weeks Of Treatment: 0 Clustered Wound: No Photos Wound Measurements Length: (cm) 3.1 Width: (cm) 2.9 Depth: (cm) 0.1 Area: (cm) 7.061 Volume: (cm) 0.706 % Reduction in Area: % Reduction in Volume: Epithelialization: None Tunneling: No Undermining: No Wound Description Classification: Full Thickness Without Exposed Support Structures Exudate Amount: Medium Exudate Type: Serosanguineous Exudate Color: red, brown Foul Odor After Cleansing: No Slough/Fibrino Yes Wound Bed Granulation Amount: Small (1-33%) Exposed Structure Granulation Quality: Red, Pink Fat Layer (Subcutaneous Tissue) Exposed: Yes Necrotic Amount: Large (67-100%) Necrotic Quality: [place], Adherent Slough Treatment Notes Wound #1 (Lower Leg) Wound Laterality: Right [name], [name] ([id number]) [id number] Page 9 of 9 [facility] Ancillary Kit - 15 Day Supply Discharge Instruction: Use supplies as instructed; Kit contains: (15) Saline Bullets; (15) 3x3 Gauze; 15 pr Gloves Soap and Water Discharge Instruction: Gently cleanse wound with antibacterial soap, rinse and pat dry prior to dressing wounds Vashe 5.8 (oz) Discharge Instruction: Use vashe 5.8 (oz) as directed Peri-Wound  Care Topical Primary Dressing IODOFLEX 0.9% Cadexomer Iodine Pad Discharge Instruction: Apply Iodoflex to wound bed only as directed. Secondary Dressing (BORDER) Zetuvit Plus SILICONE BORDER Dressing 4x4 (in/in) Discharge Instruction: Please do not put silicone bordered dressings under wraps. Use non-bordered dressing only. Secured With Tubigrip Size D, 3x10 (in/yd) Discharge Instruction: single layer Compression Wrap Compression Stockings Add-Ons Electronic Signature(s) Signed: 08/08/2022 9:21:19 AM By: Worthy Keeler PA-C Signed: 08/08/2022 4:28:49 PM By: Levora Dredge Entered By: Worthy Keeler on 08/08/2022 09:21:19 -------------------------------------------------------------------------------- Vitals Details Patient Name: Date of Service: [name], Brooke Ludwig [name]. 08/08/2022 8:15 A M Medical Record Number: [id number] Patient Account Number: [id number] Date of Birth/Sex: Treating RN: [birth year] (87 y.o. Valetta [name] Primary Care Edem Tiegs: [name] [name]: Referring Nathania Waldman: Treating Alvira Hecht/Extender: [name] in Treatment: 0 Vital Signs Time Taken: 08:26 Temperature (F): 97.6 Height (in): 69 Pulse (bpm): 75 Source: Stated Respiratory Rate (breaths/min): 18 Weight (lbs): 194 Blood Pressure (mmHg): 122/73 Source: Stated Reference Range: 80 - 120 mg / dl Body Mass Index (BMI): 28.6 Electronic Signature(s) Signed: 08/08/2022 4:28:49 PM By: Levora Dredge Entered By: Levora Dredge on 08/08/2022 08:28:53

## 2022-08-15 ENCOUNTER — Encounter: Payer: HMO | Admitting: Physician Assistant

## 2022-08-15 DIAGNOSIS — L97812 Non-pressure chronic ulcer of other part of right lower leg with fat layer exposed: Secondary | ICD-10-CM | POA: Diagnosis not present

## 2022-08-15 DIAGNOSIS — S81811A Laceration without foreign body, right lower leg, initial encounter: Secondary | ICD-10-CM | POA: Diagnosis not present

## 2022-08-15 NOTE — Progress Notes (Signed)
[name] ([id number]) [id number] Page 1 of 6 Visit Report for 08/15/2022 Chief Complaint Document Details Patient Name: Date of Service: [name] 08/15/2022 1:00 PM Medical Record Number: [id number] Patient Account Number: [id number] Date of Birth/Sex: Treating RN: [birth year] (87 y.o. [name] Primary Care Provider: Adrian Prows Other Clinician: Referring Provider: Treating Provider/Extender: Leanor Rubenstein in Treatment: 1 Information Obtained from: Patient Chief Complaint Skin tear right leg 07/24/22 Electronic Signature(s) Signed: 08/15/2022 1:23:40 PM By: Worthy Keeler PA-C Entered By: Worthy Keeler on 08/15/2022 13:23:40 -------------------------------------------------------------------------------- Debridement Details Patient Name: Date of Service: Brooke Wyline Beady RA R. 08/15/2022 1:00 PM Medical Record Number: [id number] Patient Account Number: [id number] Date of Birth/Sex: Treating RN: 1935/07/14 (87 y.o. [name], [name] Primary Care Provider: Adrian Prows Other Clinician: Referring Provider: Treating Provider/Extender: Leanor Rubenstein in Treatment: 1 Debridement Performed for Assessment: Wound #1 Right Lower Leg Performed By: Physician Tommie Sams., PA-C Debridement Type: Debridement Level of Consciousness (Pre-procedure): Awake and Alert Pre-procedure Verification/Time Out Yes - 13:27 Taken: Pain Control: Lidocaine T Area Debrided (L x W): otal 3.2 (cm) x 2.6 (cm) = 8.32 (cm) Tissue and other material debrided: Slough, Subcutaneous, Slough Level: Skin/Subcutaneous Tissue Debridement Description: Excisional Instrument: Curette Bleeding: Minimum Hemostasis Achieved: Pressure Response to Treatment: Procedure was tolerated well Level of Consciousness (Post- Awake and Alert procedure): Post Debridement Measurements of Total Wound Length: (cm) 3.2 Width:  (cm) 2.6 Depth: (cm) 0.1 Volume: (cm) 0.653 Character of Wound/Ulcer Post Debridement: Stable Post Procedure Diagnosis Same as Pre-procedure Electronic Signature(s) Signed: 08/15/2022 6:13:56 PM By: Worthy Keeler PA-C Signed: 08/16/2022 2:54:42 PM By: Gretta Cool, BSN, RN, CWS, Kim RN, BSN Entered By: Gretta Cool, BSN, RN, CWS, [name] on 08/15/2022 13:28:56 HPI Details -------------------------------------------------------------------------------- [name] ([id number]) [id number] Page 2 of 6 Patient Name: Date of Service: [name] 08/15/2022 1:00 PM Medical Record Number: [id number] Patient Account Number: [id number] Date of Birth/Sex: Treating RN: 23-Nov-1935 (87 y.o. [name] Primary Care Provider: Adrian Prows Other Clinician: Referring Provider: Treating Provider/Extender: Leanor Rubenstein in Treatment: 1 History of Present Illness HPI Description: 08-08-2022 upon evaluation today patient appears for initial inspection here in our clinic concerning issues that she has been having with a wound on her right anterior lower leg where she dropped a shredder on her leg causing a skin tear. Unfortunately this was attempted to be reapproximated but that has not been extremely effective for her. Fortunately there does not appear to be any signs of infection systemically which is great news. With that being said there is local infection definitely noted at this point. She has been on antibiotics. Currently cefdinir and doxycycline where the 2 antibiotics that were used up to this point. This original injury occurred on March 4. Her culture showed Enterococcus faecium and that was on 08-06-2022 that was resulted. Her primary care provider Dr. Ola Spurr who is also an infectious disease specialist that stated that if she was not better at the end of her current cefdinir and doxycycline regimen that he would likely put her on  Augmentin or amoxicillin. I think we may want to switch her to Augmentin at this point. Patient has a history of hypertension and peripheral vascular disease but really no other major medical problems at this point. With that being said although she did not show any signs of infection at this point I feel like this is something that might heal readily. However  because of the screen showing PAD here in the clinic on the ABIs if she does not show signs of improvement over the next that I think we may want to go ahead and see about getting her to vascular. Further evaluation and treatment. Will however see how things do over the next week. 08-15-2022 upon evaluation today patient appears to be doing well currently in regard to her wound. She has been tolerating the dressing changes and overall seems to be doing quite well with regard to her wound I am seeing some definite improvement here. With that being said I do believe that she is making good progress with regard to the Iodoflex which I think is cleaning up the surface of the wound quite well I am pleased in that regard. She has started on the Augmentin currently and seems to be doing very well with that. Electronic Signature(s) Signed: 08/15/2022 5:43:59 PM By: Worthy Keeler PA-C Entered By: Worthy Keeler on 08/15/2022 17:43:59 -------------------------------------------------------------------------------- Physical Exam Details Patient Name: Date of Service: Brooke Husbands RA R. 08/15/2022 1:00 PM Medical Record Number: [id number] Patient Account Number: [id number] Date of Birth/Sex: Treating RN: 11/17/1935 (87 y.o. [name] Primary Care Provider: Adrian Prows Other Clinician: Referring Provider: Treating Provider/Extender: Leanor Rubenstein in Treatment: 1 Constitutional Well-nourished and well-hydrated in no acute distress. Respiratory normal breathing without difficulty. Psychiatric this patient is  able to make decisions and demonstrates good insight into disease process. Alert and Oriented x 3. pleasant and cooperative. Notes Upon inspection patient's wound bed actually showed signs of good granulation epithelization at this point. Fortunately I do not see any evidence of active infection which seems to be worsening in fact I feel like this is getting better with the Augmentin this is great news. Overall I think the wound is can require some debridement I did perform debridement today she seems to have sufficient blood flow to heal we will continue to monitor this closely however. Electronic Signature(s) Signed: 08/15/2022 5:44:27 PM By: Worthy Keeler PA-C Entered By: Worthy Keeler on 08/15/2022 17:44:27 -------------------------------------------------------------------------------- Physician Orders Details Patient Name: Date of Service: Brooke Wyline Beady RA R. 08/15/2022 1:00 PM Medical Record Number: [id number] Patient Account Number: [id number] Date of Birth/Sex: Treating RN: [birth year] (87 y.o. [name] Primary Care Provider: Adrian Prows Other Clinician: Referring Provider: Treating Provider/Extender: Leanor Rubenstein in Treatment: 1 Verbal / Phone Orders: No Diagnosis [address] CHERRYLE, BAUMANN R ([id number]) [id number] Page 3 of 6 ICD-10 Coding Code Description [id number] Laceration without foreign body, right lower leg, initial encounter L97.812 Non-pressure chronic ulcer of other part of right lower leg with fat layer exposed I73.89 Other specified peripheral vascular diseases I10 Essential (primary) hypertension Follow-up Appointments Return Appointment in 1 week. Bathing/ Shower/ Hygiene May shower; gently cleanse wound with antibacterial soap, rinse and pat dry prior to dressing wounds No tub bath. Anesthetic (Use 'Patient Medications' Section for Anesthetic Order Entry) Wound #1 Right Lower Leg Lidocaine  applied to wound bed Edema Control - Lymphedema / Segmental Compressive Device / Other Tubigrip single layer applied. - size D Elevate, Exercise Daily and A void Standing for Long Periods of Time. Elevate legs to the level of the heart and pump ankles as often as possible Elevate leg(s) parallel to the floor when sitting. DO YOUR BEST to sleep in the bed at night. DO NOT sleep in your recliner. Long hours of sitting in a recliner leads to swelling of the legs and/or  potential wounds on your backside. Medications-Please add to medication list. ntibiotics - Continue antibiotics as prescribed P.O. A Wound Treatment Wound #1 - Lower Leg Wound Laterality: Right Cleanser: Soap and Water 3 x Per Week/30 Days Discharge Instructions: Gently cleanse wound with antibacterial soap, rinse and pat dry prior to dressing wounds Cleanser: Vashe 5.8 (oz) 3 x Per Week/30 Days Discharge Instructions: Use vashe 5.8 (oz) as directed Prim Dressing: IODOFLEX 0.9% Cadexomer Iodine Pad 3 x Per Week/30 Days ary Discharge Instructions: Apply Iodoflex to wound bed only as directed. Secondary Dressing: (BORDER) Zetuvit Plus SILICONE BORDER Dressing 4x4 (in/in) (Generic) 3 x Per Week/30 Days Discharge Instructions: Please do not put silicone bordered dressings under wraps. Use non-bordered dressing only. Secured With: Tubigrip Size D, 3x10 (in/yd) 3 x Per Week/30 Days Discharge Instructions: single layer Electronic Signature(s) Signed: 08/15/2022 6:13:56 PM By: Worthy Keeler PA-C Signed: 08/16/2022 2:54:42 PM By: Gretta Cool, BSN, RN, CWS, Kim RN, BSN Entered By: Gretta Cool, BSN, RN, CWS, [name] on 08/15/2022 13:29:33 -------------------------------------------------------------------------------- Problem List Details Patient Name: Date of Service: Brooke Husbands RA R. 08/15/2022 1:00 PM Medical Record Number: [id number] Patient Account Number: [id number] Date of Birth/Sex: Treating RN: 1936-04-26 (87 y.o. [name] Primary Care Provider: Adrian Prows Other Clinician: Referring Provider: Treating Provider/Extender: Leanor Rubenstein in Treatment: 1 Active Problems ICD-10 Encounter Code Description Active Date MDM Diagnosis S81.811A Laceration without foreign body, right lower leg, initial encounter 08/08/2022 No Yes ALIKA, CORNEY R ([id number]) [id number] Page 4 of 6 [phone] Non-pressure chronic ulcer of other part of right lower leg with fat layer 08/08/2022 No Yes exposed I73.89 Other specified peripheral vascular diseases 08/08/2022 No Yes I10 Essential (primary) hypertension 08/08/2022 No Yes Inactive Problems Resolved Problems Electronic Signature(s) Signed: 08/15/2022 1:23:23 PM By: Worthy Keeler PA-C Entered By: Worthy Keeler on 08/15/2022 13:23:23 -------------------------------------------------------------------------------- Progress Note Details Patient Name: Date of Service: Brooke Wyline Beady RA R. 08/15/2022 1:00 PM Medical Record Number: [id number] Patient Account Number: [id number] Date of Birth/Sex: Treating RN: 12/16/1935 (87 y.o. [name] Primary Care Provider: Adrian Prows Other Clinician: Referring Provider: Treating Provider/Extender: Leanor Rubenstein in Treatment: 1 Subjective Chief Complaint Information obtained from Patient Skin tear right leg 07/24/22 History of Present Illness (HPI) 08-08-2022 upon evaluation today patient appears for initial inspection here in our clinic concerning issues that she has been having with a wound on her right anterior lower leg where she dropped a shredder on her leg causing a skin tear. Unfortunately this was attempted to be reapproximated but that has not been extremely effective for her. Fortunately there does not appear to be any signs of infection systemically which is great news. With that being said there is local infection definitely  noted at this point. She has been on antibiotics. Currently cefdinir and doxycycline where the 2 antibiotics that were used up to this point. This original injury occurred on March 4. Her culture showed Enterococcus faecium and that was on 08-06-2022 that was resulted. Her primary care provider Dr. Ola Spurr who is also an infectious disease specialist that stated that if she was not better at the end of her current cefdinir and doxycycline regimen that he would likely put her on Augmentin or amoxicillin. I think we may want to switch her to Augmentin at this point. Patient has a history of hypertension and peripheral vascular disease but really no other major medical problems at this point. With that being said although she did not show  any signs of infection at this point I feel like this is something that might heal readily. However because of the screen showing PAD here in the clinic on the ABIs if she does not show signs of improvement over the next that I think we may want to go ahead and see about getting her to vascular. Further evaluation and treatment. Will however see how things do over the next week. 08-15-2022 upon evaluation today patient appears to be doing well currently in regard to her wound. She has been tolerating the dressing changes and overall seems to be doing quite well with regard to her wound I am seeing some definite improvement here. With that being said I do believe that she is making good progress with regard to the Iodoflex which I think is cleaning up the surface of the wound quite well I am pleased in that regard. She has started on the Augmentin currently and seems to be doing very well with that. Objective Constitutional Well-nourished and well-hydrated in no acute distress. Vitals Time Taken: 1:04 PM, Height: 69 in, Weight: 194 lbs, BMI: 28.6, Temperature: 97.8 F, Pulse: 67 bpm, Respiratory Rate: 18 breaths/min, Blood Pressure: 127/74 mmHg. Respiratory normal  breathing without difficulty. LAROYCE, WUJCIK ([id number]) [id number] Page 5 of 6 Psychiatric this patient is able to make decisions and demonstrates good insight into disease process. Alert and Oriented x 3. pleasant and cooperative. General Notes: Upon inspection patient's wound bed actually showed signs of good granulation epithelization at this point. Fortunately I do not see any evidence of active infection which seems to be worsening in fact I feel like this is getting better with the Augmentin this is great news. Overall I think the wound is can require some debridement I did perform debridement today she seems to have sufficient blood flow to heal we will continue to monitor this closely however. Integumentary (Hair, Skin) Wound #1 status is Open. Original cause of wound was Trauma. The date acquired was: 07/24/2022. The wound has been in treatment 1 weeks. The wound is located on the Right Lower Leg. The wound measures 3.2cm length x 2.6cm width x 0.1cm depth; 6.535cm^2 area and 0.653cm^3 volume. There is Fat Layer (Subcutaneous Tissue) exposed. There is no tunneling noted. There is a medium amount of serosanguineous drainage noted. There is small (1-33%) red, pink granulation within the wound bed. There is a large (67-100%) amount of necrotic tissue within the wound bed including Eschar and Adherent Slough. Assessment Active Problems ICD-10 Laceration without foreign body, right lower leg, initial encounter Non-pressure chronic ulcer of other part of right lower leg with fat layer exposed Other specified peripheral vascular diseases Essential (primary) hypertension Procedures Wound #1 Pre-procedure diagnosis of Wound #1 is a Skin T located on the Right Lower Leg . There was a Excisional Skin/Subcutaneous Tissue Debridement with a ear total area of 8.32 sq cm performed by Tommie Sams., PA-C. With the following instrument(s): Curette Material removed  includes Subcutaneous Tissue and Slough and after achieving pain control using Lidocaine. No specimens were taken. A time out was conducted at 13:27, prior to the start of the procedure. A Minimum amount of bleeding was controlled with Pressure. The procedure was tolerated well. Post Debridement Measurements: 3.2cm length x 2.6cm width x 0.1cm depth; 0.653cm^3 volume. Character of Wound/Ulcer Post Debridement is stable. Post procedure Diagnosis Wound #1: Same as Pre-Procedure Plan Follow-up Appointments: Return Appointment in 1 week. Bathing/ Shower/ Hygiene: May shower; gently cleanse wound with antibacterial soap, rinse  and pat dry prior to dressing wounds No tub bath. Anesthetic (Use 'Patient Medications' Section for Anesthetic Order Entry): Wound #1 Right Lower Leg: Lidocaine applied to wound bed Edema Control - Lymphedema / Segmental Compressive Device / Other: Tubigrip single layer applied. - size D Elevate, Exercise Daily and Avoid Standing for Long Periods of Time. Elevate legs to the level of the heart and pump ankles as often as possible Elevate leg(s) parallel to the floor when sitting. DO YOUR BEST to sleep in the bed at night. DO NOT sleep in your recliner. Long hours of sitting in a recliner leads to swelling of the legs and/or potential wounds on your backside. Medications-Please add to medication list.: P.O. Antibiotics - Continue antibiotics as prescribed WOUND #1: - Lower Leg Wound Laterality: Right Cleanser: Soap and Water 3 x Per Week/30 Days Discharge Instructions: Gently cleanse wound with antibacterial soap, rinse and pat dry prior to dressing wounds Cleanser: Vashe 5.8 (oz) 3 x Per Week/30 Days Discharge Instructions: Use vashe 5.8 (oz) as directed Prim Dressing: IODOFLEX 0.9% Cadexomer Iodine Pad 3 x Per Week/30 Days ary Discharge Instructions: Apply Iodoflex to wound bed only as directed. Secondary Dressing: (BORDER) Zetuvit Plus SILICONE BORDER Dressing 4x4  (in/in) (Generic) 3 x Per Week/30 Days Discharge Instructions: Please do not put silicone bordered dressings under wraps. Use non-bordered dressing only. Secured With: Tubigrip Size D, 3x10 (in/yd) 3 x Per Week/30 Days Discharge Instructions: single layer 1. I would recommend that we have the patient continue to monitor for any signs of infection or worsening based on what I am seeing I feel like she is really doing well and I am pleased with the interval change from last week to this week. 2. I am good recommend as well that she continue with the Tubigrip size D were also going to continue with the Iodoflex which I think is doing a pretty good job with the bordered foam dressing. We will see patient back for reevaluation in 1 week here in the clinic. If anything worsens or changes patient will contact our office for additional TARYN, NAEVE ([id number]) [id number] Page 6 of 6 recommendations. Electronic Signature(s) Signed: 08/15/2022 5:44:47 PM By: Worthy Keeler PA-C Entered By: Worthy Keeler on 08/15/2022 17:44:47 -------------------------------------------------------------------------------- SuperBill Details Patient Name: Date of Service: Brooke Wyline Beady RA R. 08/15/2022 Medical Record Number: [id number] Patient Account Number: [id number] Date of Birth/Sex: Treating RN: 03-19-1936 (87 y.o. [name] Primary Care Provider: Adrian Prows Other Clinician: Referring Provider: Treating Provider/Extender: Leanor Rubenstein in Treatment: 1 Diagnosis Coding ICD-10 Codes Code Description [phone] Laceration without foreign body, right lower leg, initial encounter L97.812 Non-pressure chronic ulcer of other part of right lower leg with fat layer exposed I73.89 Other specified peripheral vascular diseases I10 Essential (primary) hypertension Facility Procedures : CPT4 Code: [id number] Description: [id number] - DEB SUBQ TISSUE 20  SQ CM/< ICD-10 Diagnosis Description [id number] Non-pressure chronic ulcer of other part of right lower leg with fat layer exp Modifier: osed Quantity: 1 Physician Procedures : CPT4 Code Description Modifier [id number] - WC PHYS SUBQ TISS 20 SQ CM ICD-10 Diagnosis Description [id number] Non-pressure chronic ulcer of other part of right lower leg with fat layer exposed Quantity: 1 Electronic Signature(s) Signed: 08/15/2022 5:44:59 PM By: Worthy Keeler PA-C Entered By: Worthy Keeler on 08/15/2022 17:44:59

## 2022-08-16 NOTE — Progress Notes (Signed)
Brooke, [name] ([id number]) [id number] Page 1 of 7 Visit Report for 08/15/2022 Arrival Information Details Patient Name: Date of Service: [name] RA R. 08/15/2022 1:00 PM Medical Record Number: [id number] Patient Account Number: [id number] Date of Birth/Sex: Treating RN: [birth year] (87 y.o. [name] Primary Care Duncan Alejandro: Adrian Prows Other Clinician: Referring Shanavia Makela: Treating Abdinasir Spadafore/Extender: Leanor Rubenstein in Treatment: 1 Visit Information History Since Last Visit Added or deleted any medications: No Patient Arrived: Ambulatory Any new allergies or adverse reactions: No Arrival Time: 13:04 Had a fall or experienced change in No Accompanied By: self activities of daily living that may affect Transfer Assistance: None risk of falls: Patient Identification Verified: Yes Hospitalized since last visit: No Secondary Verification Process Completed: Yes Has Dressing in Place as Prescribed: Yes Patient Has Alerts: Yes Pain Present Now: No Patient Alerts: Patient on Blood Thinner 08/08/22 ABI Right PAD 08/08/22 ABI Left PAD Electronic Signature(s) Signed: 08/15/2022 4:21:05 PM By: Levora Dredge Entered By: Levora Dredge on 08/15/2022 13:04:45 -------------------------------------------------------------------------------- Clinic Level of Care Assessment Details Patient Name: Date of Service: [name] 08/15/2022 1:00 PM Medical Record Number: [id number] Patient Account Number: [id number] Date of Birth/Sex: Treating RN: 09/06/1935 (87 y.o. [name] Primary Care Lluvia Gwynne: Adrian Prows Other Clinician: Referring Asjia Berrios: Treating Dmya Long/Extender: Leanor Rubenstein in Treatment: 1 Clinic Level of Care Assessment Items TOOL 1 Quantity Score []  - 0 Use when EandM and Procedure is performed on INITIAL visit ASSESSMENTS - Nursing Assessment / Reassessment []  -  0 General Physical Exam (combine w/ comprehensive assessment (listed just below) when performed on new pt. evals) []  - 0 Comprehensive Assessment (HX, ROS, Risk Assessments, Wounds Hx, etc.) ASSESSMENTS - Wound and Skin Assessment / Reassessment []  - 0 Dermatologic / Skin Assessment (not related to wound area) ASSESSMENTS - Ostomy and/or Continence Assessment and Care []  - 0 Incontinence Assessment and Management []  - 0 Ostomy Care Assessment and Management (repouching, etc.) PROCESS - Coordination of Care []  - 0 Simple Patient / Family Education for ongoing care []  - 0 Complex (extensive) Patient / Family Education for ongoing care []  - 0 Staff obtains Programmer, systems, Records, T Results / Process Orders est []  - 0 Staff telephones HHA, Nursing Homes / Clarify orders / etc []  - 0 Routine Transfer to another Facility (non-emergent condition) []  - 0 Routine Hospital Admission (non-emergent condition) []  - 0 New Admissions / Biomedical engineer / Ordering NPWT Apligraf, etc. , []  - 0 Emergency Hospital Admission (emergent condition) PROCESS - Special Needs BINNIE, ORTEZ R ([id number]) [id number] Page 2 of 7 []  - 0 Pediatric / Minor Patient Management []  - 0 Isolation Patient Management []  - 0 Hearing / Language / Visual special needs []  - 0 Assessment of Community assistance (transportation, D/C planning, etc.) []  - 0 Additional assistance / Altered mentation []  - 0 Support Surface(s) Assessment (bed, cushion, seat, etc.) INTERVENTIONS - Miscellaneous []  - 0 External ear exam []  - 0 Patient Transfer (multiple staff / Civil Service fast streamer / Similar devices) []  - 0 Simple Staple / Suture removal (25 or less) []  - 0 Complex Staple / Suture removal (26 or more) []  - 0 Hypo/Hyperglycemic Management (do not check if billed separately) []  - 0 Ankle / Brachial Index (ABI) - do not check if billed separately Has the patient been seen at the hospital  within the last three years: Yes Total Score: 0 Level Of Care: ____ Electronic Signature(s) Signed: 08/16/2022 2:54:42 PM By: Gretta Cool, BSN,  RN, CWS, Kim RN, BSN Entered By: Gretta Cool, BSN, RN, CWS, Kim on 08/15/2022 13:48:57 -------------------------------------------------------------------------------- Encounter Discharge Information Details Patient Name: Date of Service: [name], Brooke RA R. 08/15/2022 1:00 PM Medical Record Number: [id number] Patient Account Number: [id number] Date of Birth/Sex: Treating RN: [birth year] (87 y.o. [name] Primary Care [name]: Adrian Prows Other Clinician: Referring [name]: Treating [name]/Extender: Leanor Rubenstein in Treatment: 1 Encounter Discharge Information Items Post Procedure Vitals Discharge Condition: Stable Temperature (F): 97.8 Ambulatory Status: Ambulatory Pulse (bpm): 67 Discharge Destination: Home Respiratory Rate (breaths/min): 18 Transportation: Private Auto Blood Pressure (mmHg): 127/74 Accompanied By: self Schedule Follow-up Appointment: Yes Clinical Summary of Care: Electronic Signature(s) Signed: 08/16/2022 2:54:42 PM By: Gretta Cool, BSN, RN, CWS, Kim RN, BSN Entered By: Gretta Cool, BSN, RN, CWS, Kim on 08/15/2022 13:50:23 -------------------------------------------------------------------------------- Lower Extremity Assessment Details Patient Name: Date of Service: [name], Brooke Ludwig RA R. 08/15/2022 1:00 PM Medical Record Number: [id number] Patient Account Number: [id number] Date of Birth/Sex: Treating RN: 1935-12-10 (87 y.o. [name] Primary Care [name]: Adrian Prows Other Clinician: Referring [name]: Treating [name]/Extender: Leanor Rubenstein in Treatment: 1 Edema Assessment Assessed: [Left: No] [Right: No] Edema: [Left: Ye] [Right: s] Calf ATHEL, CHIERA R ([id number]) [id number] Page 3 of 7 Left: Right: Point of Measurement: 34  cm From Medial Instep 41.4 cm Ankle Left: Right: Point of Measurement: 11 cm From Medial Instep 25.3 cm Vascular Assessment Pulses: Dorsalis Pedis Palpable: [Right:Yes] Posterior Tibial Palpable: [Right:Yes] Electronic Signature(s) Signed: 08/15/2022 4:21:05 PM By: Levora Dredge Entered By: Levora Dredge on 08/15/2022 13:12:15 -------------------------------------------------------------------------------- Multi Wound Chart Details Patient Name: Date of Service: [name], Brooke Ludwig RA R. 08/15/2022 1:00 PM Medical Record Number: [id number] Patient Account Number: [id number] Date of Birth/Sex: Treating RN: 01-21-1936 (87 y.o. [name] Primary Care Vearl Aitken: Adrian Prows Other Clinician: Referring Orvan Papadakis: Treating Jayle Solarz/Extender: Leanor Rubenstein in Treatment: 1 Vital Signs Height(in): 69 Pulse(bpm): 67 Weight(lbs): 194 Blood Pressure(mmHg): 127/74 Body Mass Index(BMI): 28.6 Temperature(F): 97.8 Respiratory Rate(breaths/min): 18 [1:Photos:] [N/A:N/A] Right Lower Leg N/A N/A Wound Location: Trauma N/A N/A Wounding Event: Skin Tear N/A N/A Primary Etiology: Anemia, Hypertension, Osteoarthritis N/A N/A Comorbid History: 07/24/2022 N/A N/A Date Acquired: 1 N/A N/A Weeks of Treatment: Open N/A N/A Wound Status: No N/A N/A Wound Recurrence: 3.2x2.6x0.1 N/A N/A Measurements L x W x D (cm) 6.535 N/A N/A A (cm) : rea 0.653 N/A N/A Volume (cm) : 7.40% N/A N/A % Reduction in A rea: 7.50% N/A N/A % Reduction in Volume: Full Thickness Without Exposed N/A N/A Classification: Support Structures Medium N/A N/A Exudate A mount: Serosanguineous N/A N/A Exudate Type: red, brown N/A N/A Exudate Color: Small (1-33%) N/A N/A Granulation A mount: Red, Pink N/A N/A Granulation Quality: Large (67-100%) N/A N/A Necrotic A mount: Eschar, Adherent Slough N/A N/A Necrotic Tissue: Fat Layer (Subcutaneous Tissue): Yes N/A N/A Exposed  Structures: None N/A N/A EpithelializationDEVLYN, KHATOON R ([id number]) [id number] Page 4 of 7 Treatment Notes Electronic Signature(s) Signed: 08/16/2022 2:54:42 PM By: Gretta Cool, BSN, RN, CWS, Kim RN, BSN Entered By: Gretta Cool, BSN, RN, CWS, Kim on 08/15/2022 13:27:26 -------------------------------------------------------------------------------- Multi-Disciplinary Care Plan Details Patient Name: Date of Service: [name], Brooke RA R. 08/15/2022 1:00 PM Medical Record Number: [id number] Patient Account Number: [id number] Date of Birth/Sex: Treating RN: Apr 10, 1936 (87 y.o. [name] Primary Care Charlsie Fleeger: Adrian Prows Other Clinician: Referring Lieutenant Abarca: Treating Ezechiel Stooksbury/Extender: Leanor Rubenstein in Treatment: 1 Active Inactive Wound/Skin Impairment Nursing Diagnoses: Impaired tissue integrity Knowledge  deficit related to ulceration/compromised skin integrity Goals: Ulcer/skin breakdown will have a volume reduction of 30% by week 4 Date Initiated: 08/08/2022 Target Resolution Date: 09/05/2022 Goal Status: Active Ulcer/skin breakdown will have a volume reduction of 50% by week 8 Date Initiated: 08/08/2022 Target Resolution Date: 10/03/2022 Goal Status: Active Ulcer/skin breakdown will have a volume reduction of 80% by week 12 Date Initiated: 08/08/2022 Target Resolution Date: 10/31/2022 Goal Status: Active Ulcer/skin breakdown will heal within 14 weeks Date Initiated: 08/08/2022 Target Resolution Date: 11/14/2022 Goal Status: Active Interventions: Assess patient/caregiver ability to obtain necessary supplies Assess patient/caregiver ability to perform ulcer/skin care regimen upon admission and as needed Assess ulceration(s) every visit Provide education on ulcer and skin care Treatment Activities: Skin care regimen initiated : 08/08/2022 Notes: Electronic Signature(s) Signed: 08/16/2022 2:54:42 PM By: Gretta Cool, BSN, RN, CWS, Kim  RN, BSN Entered By: Gretta Cool, BSN, RN, CWS, Kim on 08/15/2022 13:30:16 -------------------------------------------------------------------------------- Pain Assessment Details Patient Name: Date of Service: [name] RA R. 08/15/2022 1:00 PM Medical Record Number: [id number] Patient Account Number: [id number] Date of Birth/Sex: Treating RN: January 29, 1936 (87 y.o. [name] Primary Care Brayten Komar: Adrian Prows Other Clinician: Referring Mary Secord: Treating Trini Christiansen/Extender: Leanor Rubenstein in Treatment: 1 Active Problems Location of Pain Severity and Description of Pain Patient Has Paino No MARCENA, MATCHETT R ([id number]) [id number] Page 5 of 7 Patient Has Paino No Site Locations Rate the pain. Current Pain Level: 0 Pain Management and Medication Current Pain Management: Electronic Signature(s) Signed: 08/15/2022 4:21:05 PM By: Levora Dredge Entered By: Levora Dredge on 08/15/2022 13:05:11 -------------------------------------------------------------------------------- Patient/Caregiver Education Details Patient Name: Date of Service: [name] 3/26/2024andnbsp1:00 PM Medical Record Number: [id number] Patient Account Number: [id number] Date of Birth/Gender: Treating RN: 01/18/1936 (87 y.o. [name] Primary Care Physician: Adrian Prows Other Clinician: Referring Physician: Treating Physician/Extender: Leanor Rubenstein in Treatment: 1 Education Assessment Education Provided To: Patient Education Topics Provided Wound Debridement: Handouts: Wound Debridement Methods: Demonstration, Explain/Verbal Responses: State content correctly Wound/Skin Impairment: Handouts: Caring for Your Ulcer Methods: Demonstration, Explain/Verbal Responses: State content correctly Electronic Signature(s) Signed: 08/16/2022 2:54:42 PM By: Gretta Cool, BSN, RN, CWS, Kim RN, BSN Entered By: Gretta Cool, BSN,  RN, CWS, Kim on 08/15/2022 13:30:35 -------------------------------------------------------------------------------- Wound Assessment Details Patient Name: Date of Service: Brooke Wyline Beady RA R. 08/15/2022 1:00 PM Medical Record Number: [id number] Patient Account Number: [id number] Date of Birth/Sex: Treating RN: Apr 27, 1936 (87 y.o. [name] Primary Care Almeda Ezra: Adrian Prows Other Clinician: Referring Raynesha Tiedt: Treating Ela Moffat/Extender: Leanor Rubenstein in Treatment: 1 [name], Brooke R ([id number]) [id number] Page 6 of 7 Wound Status Wound Number: 1 Primary Etiology: Skin Tear Wound Location: Right Lower Leg Wound Status: Open Wounding Event: Trauma Comorbid History: Anemia, Hypertension, Osteoarthritis Date Acquired: 07/24/2022 Weeks Of Treatment: 1 Clustered Wound: No Photos Wound Measurements Length: (cm) 3.2 Width: (cm) 2.6 Depth: (cm) 0.1 Area: (cm) 6.535 Volume: (cm) 0.653 % Reduction in Area: 7.4% % Reduction in Volume: 7.5% Epithelialization: None Tunneling: No Wound Description Classification: Full Thickness Without Exposed Support Structures Exudate Amount: Medium Exudate Type: Serosanguineous Exudate Color: red, brown Foul Odor After Cleansing: No Slough/Fibrino Yes Wound Bed Granulation Amount: Small (1-33%) Exposed Structure Granulation Quality: Red, Pink Fat Layer (Subcutaneous Tissue) Exposed: Yes Necrotic Amount: Large (67-100%) Necrotic Quality: Eschar, Adherent Slough Treatment Notes Wound #1 (Lower Leg) Wound Laterality: Right Cleanser Soap and Water Discharge Instruction: Gently cleanse wound with antibacterial soap, rinse and pat dry prior to dressing wounds Vashe 5.8 (oz)  Discharge Instruction: Use vashe 5.8 (oz) as directed Peri-Wound Care Topical Primary Dressing IODOFLEX 0.9% Cadexomer Iodine Pad Discharge Instruction: Apply Iodoflex to wound bed only as  directed. Secondary Dressing (BORDER) Zetuvit Plus SILICONE BORDER Dressing 4x4 (in/in) Discharge Instruction: Please do not put silicone bordered dressings under wraps. Use non-bordered dressing only. Secured With Tubigrip Size D, 3x10 (in/yd) Discharge Instruction: single layer Compression Wrap Compression Stockings Add-Ons Electronic Signature(s) [name], Brooke R ([id number]) [id number] Page 7 of 7 Signed: 08/15/2022 4:21:05 PM By: Levora Dredge Entered By: Levora Dredge on 08/15/2022 13:11:26 -------------------------------------------------------------------------------- Vitals Details Patient Name: Date of Service: [name], Brooke Ludwig RA R. 08/15/2022 1:00 PM Medical Record Number: [id number] Patient Account Number: [id number] Date of Birth/Sex: Treating RN: 29-Nov-1935 (87 y.o. [name] Primary Care Alfonzo Arca: Adrian Prows Other Clinician: Referring Ailany Koren: Treating Jarika Robben/Extender: Leanor Rubenstein in Treatment: 1 Vital Signs Time Taken: 13:04 Temperature (F): 97.8 Height (in): 69 Pulse (bpm): 67 Weight (lbs): 194 Respiratory Rate (breaths/min): 18 Body Mass Index (BMI): 28.6 Blood Pressure (mmHg): 127/74 Reference Range: 80 - 120 mg / dl Electronic Signature(s) Signed: 08/15/2022 4:21:05 PM By: Levora Dredge Entered By: Levora Dredge on 08/15/2022 13:05:01

## 2022-08-22 ENCOUNTER — Ambulatory Visit: Payer: PPO | Admitting: Physician Assistant

## 2022-08-22 ENCOUNTER — Encounter: Payer: HMO | Attending: Physician Assistant | Admitting: Physician Assistant

## 2022-08-22 DIAGNOSIS — I739 Peripheral vascular disease, unspecified: Secondary | ICD-10-CM | POA: Diagnosis not present

## 2022-08-22 DIAGNOSIS — S81811A Laceration without foreign body, right lower leg, initial encounter: Secondary | ICD-10-CM | POA: Insufficient documentation

## 2022-08-22 DIAGNOSIS — I10 Essential (primary) hypertension: Secondary | ICD-10-CM | POA: Diagnosis not present

## 2022-08-22 DIAGNOSIS — W228XXA Striking against or struck by other objects, initial encounter: Secondary | ICD-10-CM | POA: Diagnosis not present

## 2022-08-22 DIAGNOSIS — L97812 Non-pressure chronic ulcer of other part of right lower leg with fat layer exposed: Secondary | ICD-10-CM | POA: Diagnosis not present

## 2022-08-22 NOTE — Progress Notes (Signed)
[name] ([id number]) [id number] Page 1 of 7 Visit Report for 08/22/2022 Arrival Information Details Patient Name: Date of Service: [name] [name]. 08/22/2022 12:00 PM Medical Record Number: [id number] Patient Account Number: [id number] Date of Birth/Sex: Treating RN: 16-Oct-1935 (86 y.o. [name] Primary Care [name]: [name] Other Clinician: Referring [name]: Treating [name]/Extender: [name] in Treatment: 2 Visit Information History Since Last Visit Added or deleted any medications: No Patient Arrived: Ambulatory Any new allergies or adverse reactions: No Arrival Time: 12:05 Had a fall or experienced change in No Accompanied By: self activities of daily living that may affect Transfer Assistance: None risk of falls: Patient Identification Verified: Yes Hospitalized since last visit: No Secondary Verification Process Completed: Yes Has Dressing in Place as Prescribed: Yes Patient Has Alerts: Yes Pain Present Now: No Patient Alerts: Patient on Blood Thinner 08/08/22 ABI Right PAD 08/08/22 ABI Left PAD Electronic Signature(s) Signed: 08/22/2022 4:27:12 PM By: Levora Dredge Entered By: Levora Dredge on 08/22/2022 12:06:05 -------------------------------------------------------------------------------- Clinic Level of Care Assessment Details Patient Name: Date of Service: [name] 08/22/2022 12:00 PM Medical Record Number: [id number] Patient Account Number: [id number] Date of Birth/Sex: Treating RN: 1936-01-01 (87 y.o. [name] Primary Care [name]: [name] Other Clinician: Referring [name]: Treating [name]/Extender: [name] in Treatment: 2 Clinic Level of Care Assessment Items TOOL 1 Quantity Score []  - 0 Use when EandM and Procedure is performed on INITIAL visit ASSESSMENTS - Nursing Assessment / Reassessment []  -  0 General Physical Exam (combine w/ comprehensive assessment (listed just below) when performed on new pt. evals) []  - 0 Comprehensive Assessment (HX, ROS, Risk Assessments, Wounds Hx, etc.) ASSESSMENTS - Wound and Skin Assessment / Reassessment []  - 0 Dermatologic / Skin Assessment (not related to wound area) ASSESSMENTS - Ostomy and/or Continence Assessment and Care []  - 0 Incontinence Assessment and Management []  - 0 Ostomy Care Assessment and Management (repouching, etc.) PROCESS - Coordination of Care []  - 0 Simple Patient / Family Education for ongoing care []  - 0 Complex (extensive) Patient / Family Education for ongoing care []  - 0 Staff obtains Programmer, systems, Records, T Results / Process Orders est []  - 0 Staff telephones HHA, Nursing Homes / Clarify orders / etc []  - 0 Routine Transfer to another Facility (non-emergent condition) []  - 0 Routine Hospital Admission (non-emergent condition) []  - 0 New Admissions / Biomedical engineer / Ordering NPWT Apligraf, etc. , []  - 0 Emergency Hospital Admission (emergent condition) PROCESS - [address] [name], [name] ([id number]) [id number] Page 2 of 7 []  - 0 Pediatric / Minor Patient Management []  - 0 Isolation Patient Management []  - 0 Hearing / Language / Visual special needs []  - 0 Assessment of Community assistance (transportation, D/C planning, etc.) []  - 0 Additional assistance / Altered mentation []  - 0 Support Surface(s) Assessment (bed, cushion, seat, etc.) INTERVENTIONS - Miscellaneous []  - 0 External ear exam []  - 0 Patient Transfer (multiple staff / Civil Service fast streamer / Similar devices) []  - 0 Simple Staple / Suture removal (25 or less) []  - 0 Complex Staple / Suture removal (26 or more) []  - 0 Hypo/Hyperglycemic Management (do not check if billed separately) []  - 0 Ankle / Brachial Index (ABI) - do not check if billed separately Has the patient been seen at the hospital  within the last three years: Yes Total Score: 0 Level Of Care: ____ Electronic Signature(s) Signed: 08/22/2022 4:27:12 PM By: Levora Dredge  Entered By: Levora Dredge on 08/22/2022 12:58:14 -------------------------------------------------------------------------------- Encounter Discharge Information Details Patient Name: Date of Service: Brooke [name]. 08/22/2022 12:00 PM Medical Record Number: [id number] Patient Account Number: [id number] Date of Birth/Sex: Treating RN: 1935/09/18 (87 y.o. [name] Primary Care [name]: [name] Other Clinician: Referring [name]: Treating [name]/Extender: [name] in Treatment: 2 Encounter Discharge Information Items Post Procedure Vitals Discharge Condition: Stable Temperature (F): 97.6 Ambulatory Status: Ambulatory Pulse (bpm): 69 Discharge Destination: Home Respiratory Rate (breaths/min): 18 Transportation: Private Auto Blood Pressure (mmHg): 117/73 Accompanied By: self Schedule Follow-up Appointment: Yes Clinical Summary of Care: Electronic Signature(s) Signed: 08/22/2022 4:27:12 PM By: Levora Dredge Entered By: Levora Dredge on 08/22/2022 12:59:36 -------------------------------------------------------------------------------- Lower Extremity Assessment Details Patient Name: Date of Service: [name] [name]. 08/22/2022 12:00 PM Medical Record Number: [id number] Patient Account Number: [id number] Date of Birth/Sex: Treating RN: 07-07-1935 (86 y.o. [name] Primary Care [name]: [name] Other Clinician: Referring [name]: Treating [name]/Extender: [name] in Treatment: 2 Edema Assessment Assessed: [Left: No] Patrice [name]: No] [Left: Edema] [Right: :] [name], [name] ([id number]) [id number] Page 3 of 7 Left: Right: Point of Measurement: 34 cm From Medial Instep 41.1 cm Ankle Left:  Right: Point of Measurement: 11 cm From Medial Instep 24.9 cm Vascular Assessment Pulses: Dorsalis Pedis Doppler Audible: [Right:Yes] Posterior Tibial Doppler Audible: [Right:Yes] Electronic Signature(s) Signed: 08/22/2022 4:27:12 PM By: Levora Dredge Entered By: Levora Dredge on 08/22/2022 12:17:24 -------------------------------------------------------------------------------- Multi Wound Chart Details Patient Name: Date of Service: [name], [name] [name]. 08/22/2022 12:00 PM Medical Record Number: [id number] Patient Account Number: [id number] Date of Birth/Sex: Treating RN: 1935/09/18 (87 y.o. [name] Primary Care Sava Proby: [name] Other Clinician: Referring Ajooni Karam: Treating Moses Odoherty/Extender: [name] in Treatment: 2 Vital Signs Height(in): 69 Pulse(bpm): 69 Weight(lbs): 194 Blood Pressure(mmHg): 117/73 Body Mass Index(BMI): 28.6 Temperature(F): 97.6 Respiratory Rate(breaths/min): 18 [1:Photos:] [N/A:N/A] Right Lower Leg N/A N/A Wound Location: Trauma N/A N/A Wounding Event: Skin Tear N/A N/A Primary Etiology: Anemia, Hypertension, Osteoarthritis N/A N/A Comorbid History: 07/24/2022 N/A N/A Date Acquired: 2 N/A N/A Weeks of Treatment: Open N/A N/A Wound Status: No N/A N/A Wound Recurrence: 3.3x3x0.2 N/A N/A Measurements L x W x D (cm) 7.775 N/A N/A A (cm) : rea 1.555 N/A N/A Volume (cm) : -10.10% N/A N/A % Reduction in A rea: -120.30% N/A N/A % Reduction in Volume: Full Thickness Without Exposed N/A N/A Classification: Support Structures Medium N/A N/A Exudate A mount: Serosanguineous N/A N/A Exudate Type: red, brown N/A N/A Exudate Color: Small (1-33%) N/A N/A Granulation A mount: Red, Pink N/A N/A Granulation Quality: Large (67-100%) N/A N/A Necrotic A mount: Fat Layer (Subcutaneous Tissue): Yes N/A N/A Exposed Structures: None N/A N/A Epithelialization: Treatment Notes [name], [name] ([id number]) [id number] Page 4 of 7 Electronic Signature(s) Signed: 08/22/2022 4:27:12 PM By: Levora Dredge Entered By: Levora Dredge on 08/22/2022 12:42:41 -------------------------------------------------------------------------------- Multi-Disciplinary Care Plan Details Patient Name: Date of Service: [name], [place] [name]. 08/22/2022 12:00 PM Medical Record Number: [id number] Patient Account Number: [id number] Date of Birth/Sex: Treating RN: [birth year] (87 y.o. [name] Primary Care Race Latour: [name] Other Clinician: Referring Braedan Meuth: Treating Zaccheaus Storlie/Extender: [name] in Treatment: 2 Active Inactive Wound/Skin Impairment Nursing Diagnoses: Impaired tissue integrity Knowledge deficit related to ulceration/compromised skin integrity Goals: Ulcer/skin breakdown will have a volume reduction of 30% by week 4 Date Initiated: 08/08/2022 Target Resolution Date: 09/05/2022 Goal Status: Active Ulcer/skin  breakdown will have a volume reduction of 50% by week 8 Date Initiated: 08/08/2022 Target Resolution Date: 10/03/2022 Goal Status: Active Ulcer/skin breakdown will have a volume reduction of 80% by week 12 Date Initiated: 08/08/2022 Target Resolution Date: 10/31/2022 Goal Status: Active Ulcer/skin breakdown will heal within 14 weeks Date Initiated: 08/08/2022 Target Resolution Date: 11/14/2022 Goal Status: Active Interventions: Assess patient/caregiver ability to obtain necessary supplies Assess patient/caregiver ability to perform ulcer/skin care regimen upon admission and as needed Assess ulceration(s) every visit Provide education on ulcer and skin care Treatment Activities: Skin care regimen initiated : 08/08/2022 Notes: Electronic Signature(s) Signed: 08/22/2022 4:27:12 PM By: Levora Dredge Entered By: Levora Dredge on 08/22/2022  12:58:32 -------------------------------------------------------------------------------- Pain Assessment Details Patient Name: Date of Service: [name] [name]. 08/22/2022 12:00 PM Medical Record Number: [id number] Patient Account Number: [id number] Date of Birth/Sex: Treating RN: [birth year] (87 y.o. [name] Primary Care Alekzander Cardell: [name] Other Clinician: Referring David Rodriquez: Treating Tanielle Emigh/Extender: [name] in Treatment: 2 Active Problems Location of Pain Severity and Description of Pain Patient Has Paino No Site Locations Rate the pain. [name], [name] ([id number]) [id number] Page 5 of 7 Rate the pain. Current Pain Level: 0 Pain Management and Medication Current Pain Management: Electronic Signature(s) Signed: 08/22/2022 4:27:12 PM By: Levora Dredge Entered By: Levora Dredge on 08/22/2022 12:07:56 -------------------------------------------------------------------------------- Patient/Caregiver Education Details Patient Name: Date of Service: [name] 4/2/2024andnbsp12:00 PM Medical Record Number: [id number] Patient Account Number: [id number] Date of Birth/Gender: Treating RN: [birth year] (87 y.o. [name] Primary Care Physician: [name] Other Clinician: Referring Physician: Treating Physician/Extender: [name] in Treatment: 2 Education Assessment Education Provided To: Patient Education Topics Provided Infection: Handouts: Hygiene and Infection Prevention Methods: Explain/Verbal Responses: State content correctly Wound Debridement: Handouts: Wound Debridement Methods: Explain/Verbal Responses: State content correctly Wound/Skin Impairment: Handouts: Caring for Your Ulcer Methods: Explain/Verbal Responses: State content correctly Electronic Signature(s) Signed: 08/22/2022 4:27:12 PM By: Levora Dredge Entered By: Levora Dredge on 08/22/2022 12:59:00 -------------------------------------------------------------------------------- Wound Assessment Details Patient Name: Date of Service: [name] [name]. 08/22/2022 12:00 PM Medical Record Number: [id number] Patient Account Number: [id number] Date of Birth/Sex: Treating RN: 12-Jun-1935 (87 y.o. [name] [place], [place] [name] ([id number]) [id number] Page 6 of 7 Primary Care Elke Holtry: [name] Other Clinician: Referring Shalaina Guardiola: Treating Derik Fults/Extender: [name] in Treatment: 2 Wound Status Wound Number: 1 Primary Etiology: Skin Tear Wound Location: Right Lower Leg Wound Status: Open Wounding Event: Trauma Comorbid History: Anemia, Hypertension, Osteoarthritis Date Acquired: 07/24/2022 Weeks Of Treatment: 2 Clustered Wound: No Photos Wound Measurements Length: (cm) 3.3 Width: (cm) 3 Depth: (cm) 0.2 Area: (cm) 7.775 Volume: (cm) 1.555 % Reduction in Area: -10.1% % Reduction in Volume: -120.3% Epithelialization: None Tunneling: No Undermining: No Wound Description Classification: Full Thickness Without Exposed Support Structures Exudate Amount: Medium Exudate Type: Serosanguineous Exudate Color: red, brown Foul Odor After Cleansing: No Slough/Fibrino Yes Wound Bed Granulation Amount: Small (1-33%) Exposed Structure Granulation Quality: Red, Pink Fat Layer (Subcutaneous Tissue) Exposed: Yes Necrotic Amount: Large (67-100%) Necrotic Quality: Adherent Slough Treatment Notes Wound #1 (Lower Leg) Wound Laterality: Right Cleanser Soap and Water Discharge Instruction: Gently cleanse wound with antibacterial soap, rinse and pat dry prior to dressing wounds Vashe 5.8 (oz) Discharge Instruction: Use vashe 5.8 (oz) as directed Peri-Wound Care Topical Primary Dressing IODOFLEX 0.9% Cadexomer Iodine Pad Discharge Instruction: Apply Iodoflex to wound bed only as  directed. Secondary Dressing (BORDER) Zetuvit Plus SILICONE BORDER  Dressing 4x4 (in/in) Discharge Instruction: Please do not put silicone bordered dressings under wraps. Use non-bordered dressing only. Secured With Tubigrip Size D, 3x10 (in/yd) Discharge Instruction: single layer Compression Wrap Compression Stockings [name], [name] ([id number]) [id number] Page 7 of 7 Add-Ons Electronic Signature(s) Signed: 08/22/2022 12:19:18 PM By: Levora Dredge Entered By: Levora Dredge on 08/22/2022 12:19:18 -------------------------------------------------------------------------------- Vitals Details Patient Name: Date of Service: Brooke [name], [name] [name]. 08/22/2022 12:00 PM Medical Record Number: [id number] Patient Account Number: [id number] Date of Birth/Sex: Treating RN: 10-11-1935 (61 y.o. [name] Primary Care Jacquelynn Friend: [name] Other Clinician: Referring Neal Trulson: Treating Bradely Rudin/Extender: [name] in Treatment: 2 Vital Signs Time Taken: 12:06 Temperature (F): 97.6 Height (in): 69 Pulse (bpm): 69 Weight (lbs): 194 Respiratory Rate (breaths/min): 18 Body Mass Index (BMI): 28.6 Blood Pressure (mmHg): 117/73 Reference Range: 80 - 120 mg / dl Electronic Signature(s) Signed: 08/22/2022 4:27:12 PM By: Levora Dredge Entered By: Levora Dredge on 08/22/2022 12:07:48

## 2022-08-22 NOTE — Progress Notes (Addendum)
[name], [name] ([id number]) [id number] Page 1 of 6 Visit Report for 08/22/2022 Chief Complaint Document Details Patient Name: Date of Service: [name] [name]. 08/22/2022 12:00 PM Medical Record Number: [id number] Patient Account Number: [id number] Date of Birth/Sex: Treating RN: [birth year] (87 y.o. [name] Primary Care Provider: Clydie Braun Other Clinician: Referring Provider: Treating Provider/Extender: Sydnee Cabal in Treatment: 2 Information Obtained from: Patient Chief Complaint Skin tear right leg 07/24/22 Electronic Signature(s) Signed: 08/22/2022 12:36:10 PM By: Lenda Kelp PA-C Entered By: Lenda Kelp on 08/22/2022 12:36:10 -------------------------------------------------------------------------------- Debridement Details Patient Name: Date of Service: [name]. 08/22/2022 12:00 PM Medical Record Number: [id number] Patient Account Number: [id number] Date of Birth/Sex: Treating RN: [birth year] (87 y.o. [name] Primary Care Provider: Clydie Braun Other Clinician: Referring Provider: Treating Provider/Extender: Sydnee Cabal in Treatment: 2 Debridement Performed for Assessment: Wound #1 Right Lower Leg Performed By: Physician Nelida Meuse., PA-C Debridement Type: Debridement Level of Consciousness (Pre-procedure): Awake and Alert Pre-procedure Verification/Time Out Yes - 12:44 Taken: Pain Control: Lidocaine 4% T opical Solution T Area Debrided (L x W): otal 3.3 (cm) x 3 (cm) = 9.9 (cm) Tissue and other material debrided: Viable, Non-Viable, Slough, Subcutaneous, Slough Level: Skin/Subcutaneous Tissue Debridement Description: Excisional Instrument: Curette Bleeding: Moderate Hemostasis Achieved: Pressure Response to Treatment: Procedure was tolerated well Level of Consciousness (Post- Awake and Alert procedure): Post Debridement Measurements  of Total Wound Length: (cm) 3.3 Width: (cm) 3 Depth: (cm) 0.2 Volume: (cm) 1.555 Character of Wound/Ulcer Post Debridement: Stable Post Procedure Diagnosis Same as Pre-procedure Electronic Signature(s) Signed: 08/22/2022 4:27:12 PM By: Angelina Pih Signed: 08/25/2022 8:47:01 AM By: Lenda Kelp PA-C Entered By: Angelina Pih on 08/22/2022 12:48:25 HPI Details -------------------------------------------------------------------------------- [name] ([id number]) [id number] Page 2 of 6 Patient Name: Date of Service: [name] [name]. 08/22/2022 12:00 PM Medical Record Number: [id number] Patient Account Number: [id number] Date of Birth/Sex: Treating RN: 06/19/1935 (87 y.o. [name] Primary Care Provider: Clydie Braun Other Clinician: Referring Provider: Treating Provider/Extender: Sydnee Cabal in Treatment: 2 History of Present Illness HPI Description: 08-08-2022 upon evaluation today patient appears for initial inspection here in our clinic concerning issues that she has been having with a wound on her right anterior lower leg where she dropped a shredder on her leg causing a skin tear. Unfortunately this was attempted to be reapproximated but that has not been extremely effective for her. Fortunately there does not appear to be any signs of infection systemically which is great news. With that being said there is local infection definitely noted at this point. She has been on antibiotics. Currently cefdinir and doxycycline where the 2 antibiotics that were used up to this point. This original injury occurred on March 4. Her culture showed Enterococcus faecium and that was on 08-06-2022 that was resulted. Her primary care provider Dr. Sampson Goon who is also an infectious disease specialist that stated that if she was not better at the end of her current cefdinir and doxycycline regimen that he would likely put  her on Augmentin or amoxicillin. I think we may want to switch her to Augmentin at this point. Patient has a history of hypertension and peripheral vascular disease but really no other major medical problems at this point. With that being said although she did not show any signs of infection at this point I feel like this is something that might heal readily. However because of  the screen showing PAD here in the clinic on the ABIs if she does not show signs of improvement over the next that I think we may want to go ahead and see about getting her to vascular. Further evaluation and treatment. Will however see how things do over the next week. 08-15-2022 upon evaluation today patient appears to be doing well currently in regard to her wound. She has been tolerating the dressing changes and overall seems to be doing quite well with regard to her wound I am seeing some definite improvement here. With that being said I do believe that she is making good progress with regard to the Iodoflex which I think is cleaning up the surface of the wound quite well I am pleased in that regard. She has started on the Augmentin currently and seems to be doing very well with that. 08-22-2022 upon evaluation today patient appears to be doing well currently in regard to her wound. She is actually tolerating the dressing changes without complication. Fortunately there does not appear to be any signs of active infection at this time which is great news and overall I do believe that she is significantly improved with the Augmentin which is excellent as well. Electronic Signature(s) Signed: 08/22/2022 12:51:43 PM By: Lenda Kelp PA-C Entered By: Lenda Kelp on 08/22/2022 12:51:43 -------------------------------------------------------------------------------- Physical Exam Details Patient Name: Date of Service: [name] [name]. 08/22/2022 12:00 PM Medical Record Number: [id number] Patient Account Number:  [id number] Date of Birth/Sex: Treating RN: [birth year] (87 y.o. [name] Primary Care Provider: Clydie Braun Other Clinician: Referring Provider: Treating Provider/Extender: Sydnee Cabal in Treatment: 2 Constitutional Well-nourished and well-hydrated in no acute distress. Respiratory normal breathing without difficulty. Psychiatric this patient is able to make decisions and demonstrates good insight into disease process. Alert and Oriented x 3. pleasant and cooperative. Notes Upon inspection patient's wound bed actually showed signs of good granulation epithelization at this point. Fortunately I do not see any signs of active infection locally nor systemically which is great news and in general were headed in the right direction. I do think no fevers, chills, nausea, vomiting, or diarrhea. Electronic Signature(s) Signed: 08/22/2022 12:52:02 PM By: Lenda Kelp PA-C Entered By: Lenda Kelp on 08/22/2022 12:52:02 -------------------------------------------------------------------------------- Physician Orders Details Patient Name: Date of Service: [name]. 08/22/2022 12:00 PM Medical Record Number: [id number] Patient Account Number: [id number] Date of Birth/Sex: Treating RN: [birth year] (87 y.o. [name] Primary Care Provider: Clydie Braun Other Clinician: Referring Provider: Treating Provider/Extender: Sydnee Cabal in Treatment: 2 [name], [name] ([id number]) [id number] Page 3 of 6 Verbal / Phone Orders: No Diagnosis Coding ICD-10 Coding Code Description [phone] Laceration without foreign body, right lower leg, initial encounter L97.812 Non-pressure chronic ulcer of other part of right lower leg with fat layer exposed I73.89 Other specified peripheral vascular diseases I10 Essential (primary) hypertension Follow-up Appointments Return Appointment in 1  week. Bathing/ Shower/ Hygiene May shower; gently cleanse wound with antibacterial soap, rinse and pat dry prior to dressing wounds No tub bath. Anesthetic (Use 'Patient Medications' Section for Anesthetic Order Entry) Wound #1 Right Lower Leg Lidocaine applied to wound bed Edema Control - Lymphedema / Segmental Compressive Device / Other Tubigrip single layer applied. - size D Elevate, Exercise Daily and A void Standing for Long Periods of Time. Elevate legs to the level of the heart and pump ankles as often as possible Elevate leg(s) parallel to the  floor when sitting. DO YOUR BEST to sleep in the bed at night. DO NOT sleep in your recliner. Long hours of sitting in a recliner leads to swelling of the legs and/or potential wounds on your backside. Medications-Please add to medication list. ntibiotics - Continue antibiotics as prescribed P.O. A Wound Treatment Wound #1 - Lower Leg Wound Laterality: Right Cleanser: Soap and Water 3 x Per Week/30 Days Discharge Instructions: Gently cleanse wound with antibacterial soap, rinse and pat dry prior to dressing wounds Cleanser: Vashe 5.8 (oz) 3 x Per Week/30 Days Discharge Instructions: Use vashe 5.8 (oz) as directed Prim Dressing: IODOFLEX 0.9% Cadexomer Iodine Pad 3 x Per Week/30 Days ary Discharge Instructions: Apply Iodoflex to wound bed only as directed. Secondary Dressing: (BORDER) Zetuvit Plus SILICONE BORDER Dressing 4x4 (in/in) (Generic) 3 x Per Week/30 Days Discharge Instructions: Please do not put silicone bordered dressings under wraps. Use non-bordered dressing only. Secured With: Tubigrip Size D, 3x10 (in/yd) 3 x Per Week/30 Days Discharge Instructions: single layer Electronic Signature(s) Signed: 08/22/2022 4:27:12 PM By: Angelina Pih Signed: 08/25/2022 8:47:01 AM By: Lenda Kelp PA-C Entered By: Angelina Pih on 08/22/2022  12:58:08 -------------------------------------------------------------------------------- Problem List Details Patient Name: Date of Service: [name] [name]. 08/22/2022 12:00 PM Medical Record Number: [id number] Patient Account Number: [id number] Date of Birth/Sex: Treating RN: [birth year] (87 y.o. [name] Primary Care Provider: Clydie Braun Other Clinician: Referring Provider: Treating Provider/Extender: Sydnee Cabal in Treatment: 2 Active Problems ICD-10 Encounter Code Description Active Date MDM Diagnosis [name], [name] ([id number]) [id number] Page 4 of 6 [phone] Laceration without foreign body, right lower leg, initial encounter 08/08/2022 No Yes L97.812 Non-pressure chronic ulcer of other part of right lower leg with fat layer 08/08/2022 No Yes exposed I73.89 Other specified peripheral vascular diseases 08/08/2022 No Yes I10 Essential (primary) hypertension 08/08/2022 No Yes Inactive Problems Resolved Problems Electronic Signature(s) Signed: 08/22/2022 12:36:01 PM By: Lenda Kelp PA-C Entered By: Lenda Kelp on 08/22/2022 12:36:01 -------------------------------------------------------------------------------- Progress Note Details Patient Name: Date of Service: [name]. 08/22/2022 12:00 PM Medical Record Number: [id number] Patient Account Number: [id number] Date of Birth/Sex: Treating RN: 1936-02-04 (87 y.o. [name] Primary Care Provider: Clydie Braun Other Clinician: Referring Provider: Treating Provider/Extender: Sydnee Cabal in Treatment: 2 Subjective Chief Complaint Information obtained from Patient Skin tear right leg 07/24/22 History of Present Illness (HPI) 08-08-2022 upon evaluation today patient appears for initial inspection here in our clinic concerning issues that she has been having with a wound on her right anterior lower leg where  she dropped a shredder on her leg causing a skin tear. Unfortunately this was attempted to be reapproximated but that has not been extremely effective for her. Fortunately there does not appear to be any signs of infection systemically which is great news. With that being said there is local infection definitely noted at this point. She has been on antibiotics. Currently cefdinir and doxycycline where the 2 antibiotics that were used up to this point. This original injury occurred on March 4. Her culture showed Enterococcus faecium and that was on 08-06-2022 that was resulted. Her primary care provider Dr. Sampson Goon who is also an infectious disease specialist that stated that if she was not better at the end of her current cefdinir and doxycycline regimen that he would likely put her on Augmentin or amoxicillin. I think we may want to switch her to Augmentin at this point. Patient has a history  of hypertension and peripheral vascular disease but really no other major medical problems at this point. With that being said although she did not show any signs of infection at this point I feel like this is something that might heal readily. However because of the screen showing PAD here in the clinic on the ABIs if she does not show signs of improvement over the next that I think we may want to go ahead and see about getting her to vascular. Further evaluation and treatment. Will however see how things do over the next week. 08-15-2022 upon evaluation today patient appears to be doing well currently in regard to her wound. She has been tolerating the dressing changes and overall seems to be doing quite well with regard to her wound I am seeing some definite improvement here. With that being said I do believe that she is making good progress with regard to the Iodoflex which I think is cleaning up the surface of the wound quite well I am pleased in that regard. She has started on the Augmentin currently and  seems to be doing very well with that. 08-22-2022 upon evaluation today patient appears to be doing well currently in regard to her wound. She is actually tolerating the dressing changes without complication. Fortunately there does not appear to be any signs of active infection at this time which is great news and overall I do believe that she is significantly improved with the Augmentin which is excellent as well. Objective Constitutional Well-nourished and well-hydrated in no acute distress. [name], [name] ([id number]) [id number] Page 5 of 6 Vitals Time Taken: 12:06 PM, Height: 69 in, Weight: 194 lbs, BMI: 28.6, Temperature: 97.6 F, Pulse: 69 bpm, Respiratory Rate: 18 breaths/min, Blood Pressure: 117/73 mmHg. Respiratory normal breathing without difficulty. Psychiatric this patient is able to make decisions and demonstrates good insight into disease process. Alert and Oriented x 3. pleasant and cooperative. General Notes: Upon inspection patient's wound bed actually showed signs of good granulation epithelization at this point. Fortunately I do not see any signs of active infection locally nor systemically which is great news and in general were headed in the right direction. I do think no fevers, chills, nausea, vomiting, or diarrhea. Integumentary (Hair, Skin) Wound #1 status is Open. Original cause of wound was Trauma. The date acquired was: 07/24/2022. The wound has been in treatment 2 weeks. The wound is located on the Right Lower Leg. The wound measures 3.3cm length x 3cm width x 0.2cm depth; 7.775cm^2 area and 1.555cm^3 volume. There is Fat Layer (Subcutaneous Tissue) exposed. There is no tunneling or undermining noted. There is a medium amount of serosanguineous drainage noted. There is small (1- 33%) red, pink granulation within the wound bed. There is a large (67-100%) amount of necrotic tissue within the wound bed including Adherent  Slough. Assessment Active Problems ICD-10 Laceration without foreign body, right lower leg, initial encounter Non-pressure chronic ulcer of other part of right lower leg with fat layer exposed Other specified peripheral vascular diseases Essential (primary) hypertension Procedures Wound #1 Pre-procedure diagnosis of Wound #1 is a Skin T located on the Right Lower Leg . There was a Excisional Skin/Subcutaneous Tissue Debridement with a ear total area of 9.9 sq cm performed by Nelida Meuse., PA-C. With the following instrument(s): Curette to remove Viable and Non-Viable tissue/material. Material removed includes Subcutaneous Tissue and Slough and after achieving pain control using Lidocaine 4% T opical Solution. No specimens were taken. A time out was conducted  at 12:44, prior to the start of the procedure. A Moderate amount of bleeding was controlled with Pressure. The procedure was tolerated well. Post Debridement Measurements: 3.3cm length x 3cm width x 0.2cm depth; 1.555cm^3 volume. Character of Wound/Ulcer Post Debridement is stable. Post procedure Diagnosis Wound #1: Same as Pre-Procedure Plan 1. I am going to recommend that the patient should continue to monitor for any signs of infection or worsening. Based on seeing I think that we are on the right track and the patient is in agreement with plan we will get a continue to monitor for any signs of infection or worsening. 2. I am also can recommend that she should continue with the Iodoflex which I think is also doing a very good job here and will see where things are at follow- up. We will see patient back for reevaluation in 1 week here in the clinic. If anything worsens or changes patient will contact our office for additional recommendations. Electronic Signature(s) Signed: 08/22/2022 12:52:30 PM By: Lenda Kelp PA-C Entered By: Lenda Kelp on 08/22/2022  12:52:30 -------------------------------------------------------------------------------- SuperBill Details Patient Name: Date of Service: [name]. 08/22/2022 Medical Record Number: [id number] Patient Account Number: [id number] Date of Birth/Sex: Treating RN: 09-13-1935 (87 y.o. [name] Primary Care Provider: Clydie Braun Other Clinician: Referring Provider: Treating Provider/Extender: Sydnee Cabal in Treatment: 2 [name], [name] ([id number]) [id number] Page 6 of 6 Diagnosis Coding ICD-10 Codes Code Description [phone] Laceration without foreign body, right lower leg, initial encounter L97.812 Non-pressure chronic ulcer of other part of right lower leg with fat layer exposed I73.89 Other specified peripheral vascular diseases I10 Essential (primary) hypertension Facility Procedures : CPT4 Code: [id number] Description: 11042 - DEB SUBQ TISSUE 20 SQ CM/< ICD-10 Diagnosis Description L97.812 Non-pressure chronic ulcer of other part of right lower leg with fat layer exp Modifier: osed Quantity: 1 Physician Procedures : CPT4 Code Description Modifier [id number] 11042 - WC PHYS SUBQ TISS 20 SQ CM ICD-10 Diagnosis Description L97.812 Non-pressure chronic ulcer of other part of right lower leg with fat layer exposed Quantity: 1 Electronic Signature(s) Signed: 08/22/2022 4:27:12 PM By: Angelina Pih Signed: 08/25/2022 8:47:01 AM By: Lenda Kelp PA-C Previous Signature: 08/22/2022 12:52:42 PM Version By: Lenda Kelp PA-C Entered By: Angelina Pih on 08/22/2022 12:58:21

## 2022-08-29 ENCOUNTER — Encounter: Payer: HMO | Admitting: Physician Assistant

## 2022-08-29 DIAGNOSIS — S81811A Laceration without foreign body, right lower leg, initial encounter: Secondary | ICD-10-CM | POA: Diagnosis not present

## 2022-08-29 NOTE — Progress Notes (Signed)
Brooke [name], Brooke [name] [name] ([id number]) [id number] Page 1 of 7 Visit Report for 08/29/2022 Arrival Information Details Patient Name: Date of Service: Brooke [name] RA [name]. 08/29/2022 12:00 PM Medical Record Number: [id number] Patient Account Number: [id number] Date of Birth/Sex: Treating RN: [birth year] (87 y.o. Brooke [name] Primary Care Brooke [name]: Brooke [name] Other Clinician: Referring Brooke [name]: Treating Brooke [name]/Extender: Brooke [name] in Treatment: 3 Visit Information History Since Last Visit Added or deleted any medications: No Patient Arrived: Ambulatory Any new allergies or adverse reactions: No Arrival Time: 12:08 Had a fall or experienced change in No Accompanied By: self activities of daily living that may affect Transfer Assistance: None risk of falls: Patient Identification Verified: Yes Hospitalized since last visit: No Secondary Verification Process Completed: Yes Has Dressing in Place as Prescribed: Yes Patient Has Alerts: Yes Has Compression in Place as Prescribed: Yes Patient Alerts: Patient on Blood Thinner Pain Present Now: No 08/08/22 ABI Right PAD 08/08/22 ABI Left PAD Electronic Signature(s) Signed: 08/29/2022 3:38:07 PM By: Brooke Pih Entered By: Brooke Pih on 08/29/2022 12:09:14 -------------------------------------------------------------------------------- Clinic Level of Care Assessment Details Patient Name: Date of Service: Brooke [name] 08/29/2022 12:00 PM Medical Record Number: [id number] Patient Account Number: [id number] Date of Birth/Sex: Treating RN: May 02, 1936 (87 y.o. Brooke [name] Primary Care Sireen Halk: Brooke [name] Other Clinician: Referring Brooke [name]: Treating Brooke [name]/Extender: Brooke [name] in Treatment: 3 Clinic Level of Care Assessment Items TOOL 1 Quantity Score []  - 0 Use when EandM and Procedure is performed on INITIAL  visit ASSESSMENTS - Nursing Assessment / Reassessment []  - 0 General Physical Exam (combine w/ comprehensive assessment (listed just below) when performed on new pt. evals) []  - 0 Comprehensive Assessment (HX, ROS, Risk Assessments, Wounds Hx, etc.) ASSESSMENTS - Wound and Skin Assessment / Reassessment []  - 0 Dermatologic / Skin Assessment (not related to wound area) ASSESSMENTS - Ostomy and/or Continence Assessment and Care []  - 0 Incontinence Assessment and Management []  - 0 Ostomy Care Assessment and Management (repouching, etc.) PROCESS - Coordination of Care []  - 0 Simple Patient / Family Education for ongoing care []  - 0 Complex (extensive) Patient / Family Education for ongoing care []  - 0 Staff obtains Chiropractor, Records, T Results / Process Orders est []  - 0 Staff telephones HHA, Nursing Homes / Clarify orders / etc []  - 0 Routine Transfer to another Facility (non-emergent condition) []  - 0 Routine Hospital Admission (non-emergent condition) []  - 0 New Admissions / Manufacturing engineer / Ordering NPWT Apligraf, etc. , []  - 0 Emergency Hospital Admission (emergent condition) PROCESS - [address] [place], [place] [name] ([id number]) [id number] Page 2 of 7 []  - 0 Pediatric / Minor Patient Management []  - 0 Isolation Patient Management []  - 0 Hearing / Language / Visual special needs []  - 0 Assessment of Community assistance (transportation, D/C planning, etc.) []  - 0 Additional assistance / Altered mentation []  - 0 Support Surface(s) Assessment (bed, cushion, seat, etc.) INTERVENTIONS - Miscellaneous []  - 0 External ear exam []  - 0 Patient Transfer (multiple staff / Nurse, adult / Similar devices) []  - 0 Simple Staple / Suture removal (25 or less) []  - 0 Complex Staple / Suture removal (26 or more) []  - 0 Hypo/Hyperglycemic Management (do not check if billed separately) []  - 0 Ankle / Brachial Index (ABI) - do not check if  billed separately Has the patient been seen at the hospital within the last three years: Yes Total Score: 0 Level Of Care: ____ Electronic Signature(s)  Signed: 08/29/2022 3:38:07 PM By: Brooke [name] Entered By: Brooke [name] on 08/29/2022 12:54:45 -------------------------------------------------------------------------------- Encounter Discharge Information Details Patient Name: Date of Service: Brooke [name], Brooke DibblesLENO RA [name]. 08/29/2022 12:00 PM Medical Record Number: [id number] Patient Account Number: [id number] Date of Birth/Sex: Treating RN: August 27, 1935 (87 y.o. Brooke [name]) Gordon, [name] Primary Care Brooke [name]: Brooke BraunFItzgerald, [name] Other Clinician: Referring Brooke [name]: Treating Brooke [name]/Extender: Brooke CabalStone, Brooke Brooke [name] Weeks in Treatment: 3 Encounter Discharge Information Items Post Procedure Vitals Discharge Condition: Stable Temperature (F): 98.2 Ambulatory Status: Ambulatory Pulse (bpm): 69 Discharge Destination: Home Respiratory Rate (breaths/min): 18 Transportation: Private Auto Blood Pressure (mmHg): 142/70 Accompanied By: self Schedule Follow-up Appointment: Yes Clinical Summary of Care: Electronic Signature(s) Signed: 08/29/2022 3:38:07 PM By: Brooke [name] Entered By: Brooke [name] on 08/29/2022 12:55:50 -------------------------------------------------------------------------------- Lower Extremity Assessment Details Patient Name: Date of Service: Brooke AnchorsSA [name], Brooke RA [name]. 08/29/2022 12:00 PM Medical Record Number: [id number] Patient Account Number: [id number] Date of Birth/Sex: Treating RN: August 27, 1935 (87 y.o. Brooke [name]) Gordon, [name] Primary Care Abdias Hickam: Brooke BraunFItzgerald, [name] Other Clinician: Referring Brooke [name]: Treating Brooke [name]/Extender: Brooke CabalStone, Brooke Brooke [name] Weeks in Treatment: 3 Edema Assessment Assessed: [Left: No] [Right: No] Edema: [Left: Ye] [Right: s] Calf Hidaya BoysSAUNDERS, Brooke [name] ([id number]) [id number] Page 3 of 7 Left: Right: Point of  Measurement: 34 cm From Medial Instep 39.5 cm Ankle Left: Right: Point of Measurement: 11 cm From Medial Instep 23.4 cm Vascular Assessment Pulses: Dorsalis Pedis Palpable: [Right:Yes] Posterior Tibial Palpable: [Right:Yes] Electronic Signature(s) Signed: 08/29/2022 3:38:07 PM By: Brooke [name] Entered By: Brooke [name] on 08/29/2022 12:14:43 -------------------------------------------------------------------------------- Multi Wound Chart Details Patient Name: Date of Service: Brooke [name], Brooke DibblesLENO RA [name]. 08/29/2022 12:00 PM Medical Record Number: [id number] Patient Account Number: [id number] Date of Birth/Sex: Treating RN: August 27, 1935 (87 y.o. Brooke [name]) Gordon, [name] Primary Care Rossi Burdo: Brooke BraunFItzgerald, [name] Other Clinician: Referring Elka Satterfield: Treating Ranvir Renovato/Extender: Brooke CabalStone, Brooke Brooke [name] Weeks in Treatment: 3 Vital Signs Height(in): 69 Pulse(bpm): 69 Weight(lbs): 194 Blood Pressure(mmHg): 142/70 Body Mass Index(BMI): 28.6 Temperature(F): 98.2 Respiratory Rate(breaths/min): 18 [1:Photos:] [N/A:N/A] Right Lower Leg N/A N/A Wound Location: Trauma N/A N/A Wounding Event: Skin Tear N/A N/A Primary Etiology: Anemia, Hypertension, Osteoarthritis N/A N/A Comorbid History: 07/24/2022 N/A N/A Date Acquired: 3 N/A N/A Weeks of Treatment: Open N/A N/A Wound Status: No N/A N/A Wound Recurrence: 3.3x2.9x0.2 N/A N/A Measurements L x W x D (cm) 7.516 N/A N/A A (cm) : rea 1.503 N/A N/A Volume (cm) : -6.40% N/A N/A % Reduction in A rea: -112.90% N/A N/A % Reduction in Volume: Full Thickness Without Exposed N/A N/A Classification: Support Structures Medium N/A N/A Exudate A mount: Serosanguineous N/A N/A Exudate Type: red, brown N/A N/A Exudate Color: Medium (34-66%) N/A N/A Granulation A mount: Red, Pink N/A N/A Granulation Quality: Medium (34-66%) N/A N/A Necrotic A mount: Fat Layer (Subcutaneous Tissue): Yes N/A N/A Exposed Structures: None N/A  N/A Epithelialization: Treatment Notes Silver HugueninSAUNDERS, Brooke [name] ([id number]) [id number] Page 4 of 7 Electronic Signature(s) Signed: 08/29/2022 12:28:23 PM By: Brooke [name] Entered By: Brooke [name] on 08/29/2022 12:28:22 -------------------------------------------------------------------------------- Multi-Disciplinary Care Plan Details Patient Name: Date of Service: Brooke [name], Brooke RA [name]. 08/29/2022 12:00 PM Medical Record Number: [id number] Patient Account Number: [id number] Date of Birth/Sex: Treating RN: August 27, 1935 (87 y.o. Brooke [name]) Gordon, [name] Primary Care Sienna Stonehocker: Brooke BraunFItzgerald, [name] Other Clinician: Referring Adalberto Metzgar: Treating Elaysia Devargas/Extender: Brooke CabalStone, Brooke Brooke [name] Weeks in Treatment: 3 Active Inactive Wound/Skin Impairment Nursing Diagnoses: Impaired tissue integrity Knowledge deficit related to ulceration/compromised skin integrity Goals: Ulcer/skin breakdown will have a volume reduction of 30% by week 4 Date Initiated: 08/08/2022 Target Resolution  Date: 09/05/2022 Goal Status: Active Ulcer/skin breakdown will have a volume reduction of 50% by week 8 Date Initiated: 08/08/2022 Target Resolution Date: 10/03/2022 Goal Status: Active Ulcer/skin breakdown will have a volume reduction of 80% by week 12 Date Initiated: 08/08/2022 Target Resolution Date: 10/31/2022 Goal Status: Active Ulcer/skin breakdown will heal within 14 weeks Date Initiated: 08/08/2022 Target Resolution Date: 11/14/2022 Goal Status: Active Interventions: Assess patient/caregiver ability to obtain necessary supplies Assess patient/caregiver ability to perform ulcer/skin care regimen upon admission and as needed Assess ulceration(s) every visit Provide education on ulcer and skin care Treatment Activities: Skin care regimen initiated : 08/08/2022 Notes: Electronic Signature(s) Signed: 08/29/2022 3:38:07 PM By: Brooke Pih Entered By: Brooke Pih on 08/29/2022  12:55:01 -------------------------------------------------------------------------------- Pain Assessment Details Patient Name: Date of Service: Brooke [name] RA [name]. 08/29/2022 12:00 PM Medical Record Number: [id number] Patient Account Number: [id number] Date of Birth/Sex: Treating RN: April 20, 1936 (87 y.o. Brooke [name] Primary Care Katelyn Kohlmeyer: Brooke [name] Other Clinician: Referring Keithan Dileonardo: Treating Miles Leyda/Extender: Brooke [name] in Treatment: 3 Active Problems Location of Pain Severity and Description of Pain Patient Has Paino No Site Locations Rate the pain. Brooke [name], Brooke [name] ([id number]) [id number] Page 5 of 7 Rate the pain. Current Pain Level: 0 Pain Management and Medication Current Pain Management: Electronic Signature(s) Signed: 08/29/2022 3:38:07 PM By: Brooke Pih Entered By: Brooke Pih on 08/29/2022 12:09:55 -------------------------------------------------------------------------------- Patient/Caregiver Education Details Patient Name: Date of Service: Brooke [name] 4/9/2024andnbsp12:00 PM Medical Record Number: [id number] Patient Account Number: [id number] Date of Birth/Gender: Treating RN: 30-Jan-1936 (87 y.o. Brooke [name] Primary Care Physician: Brooke [name] Other Clinician: Referring Physician: Treating Physician/Extender: Brooke [name] in Treatment: 3 Education Assessment Education Provided To: Patient Education Topics Provided Wound Debridement: Handouts: Wound Debridement Methods: Explain/Verbal Responses: State content correctly Wound/Skin Impairment: Handouts: Caring for Your Ulcer Methods: Explain/Verbal Responses: State content correctly Electronic Signature(s) Signed: 08/29/2022 3:38:07 PM By: Brooke Pih Entered By: Brooke Pih on 08/29/2022  12:55:14 -------------------------------------------------------------------------------- Wound Assessment Details Patient Name: Date of Service: Brooke [name] RA [name]. 08/29/2022 12:00 PM Medical Record Number: [id number] Patient Account Number: [id number] Date of Birth/Sex: Treating RN: 1935-11-28 (87 y.o. Brooke [name] Primary Care Rajanae Mantia: Brooke [name] Other Clinician: Referring Xzavian Semmel: Treating Nichole Neyer/Extender: Brooke [name] in Treatment: 3 Wound Status Brooke [name], Brooke [name] ([id number]) [id number] Page 6 of 7 Wound Number: 1 Primary Etiology: Skin Tear Wound Location: Right Lower Leg Wound Status: Open Wounding Event: Trauma Comorbid History: Anemia, Hypertension, Osteoarthritis Date Acquired: 07/24/2022 Weeks Of Treatment: 3 Clustered Wound: No Photos Wound Measurements Length: (cm) 3.3 Width: (cm) 2.9 Depth: (cm) 0.2 Area: (cm) 7.516 Volume: (cm) 1.503 % Reduction in Area: -6.4% % Reduction in Volume: -112.9% Epithelialization: None Tunneling: No Undermining: No Wound Description Classification: Full Thickness Without Exposed Support Structures Exudate Amount: Medium Exudate Type: Serosanguineous Exudate Color: red, brown Foul Odor After Cleansing: No Slough/Fibrino Yes Wound Bed Granulation Amount: Medium (34-66%) Exposed Structure Granulation Quality: Red, Pink Fat Layer (Subcutaneous Tissue) Exposed: Yes Necrotic Amount: Medium (34-66%) Necrotic Quality: Adherent Slough Treatment Notes Wound #1 (Lower Leg) Wound Laterality: Right Cleanser Soap and Water Discharge Instruction: Gently cleanse wound with antibacterial soap, rinse and pat dry prior to dressing wounds Vashe 5.8 (oz) Discharge Instruction: Use vashe 5.8 (oz) as directed Peri-Wound Care Topical Primary Dressing Prisma 4.34 (in) Discharge Instruction: Moisten w/normal saline or sterile water; Cover wound as directed. Do not remove  from wound bed. Secondary Dressing (BORDER) Zetuvit Plus SILICONE BORDER  Dressing 4x4 (in/in) Discharge Instruction: Please do not put silicone bordered dressings under wraps. Use non-bordered dressing only. Secured With Tubigrip Size D, 3x10 (in/yd) Discharge Instruction: single layer Compression Wrap Compression Stockings Add-Ons Electronic Signature(s) Signed: 08/29/2022 3:38:07 PM By: Shary Decamp, [place] [name] [phone]: Brooke Pih [id number] Page 7 of 7 Signed: 08/29/2022 3:38:07 PM Entered By: Brooke Pih on 08/29/2022 12:17:35 -------------------------------------------------------------------------------- Vitals Details Patient Name: Date of Service: [name] RA [name]. 08/29/2022 12:00 PM Medical Record Number: [id number] Patient Account Number: [id number] Date of Birth/Sex: Treating RN: 1936/05/20 (87 y.o. Brooke [name] Primary Care Sherise Geerdes: Brooke [name] Other Clinician: Referring Oktober Glazer: Treating Dorothia Passmore/Extender: Brooke [name] in Treatment: 3 Vital Signs Time Taken: 12:09 Temperature (F): 98.2 Height (in): 69 Pulse (bpm): 69 Weight (lbs): 194 Respiratory Rate (breaths/min): 18 Body Mass Index (BMI): 28.6 Blood Pressure (mmHg): 142/70 Reference Range: 80 - 120 mg / dl Electronic Signature(s) Signed: 08/29/2022 3:38:07 PM By: Brooke Pih Entered By: Brooke Pih on 08/29/2022 12:09:41

## 2022-08-30 NOTE — Progress Notes (Signed)
JACLYN, HAYRE R ([id number]) [id number] Page 1 of 6 Visit Report for 08/29/2022 Chief Complaint Document Details Patient Name: Date of Service: [name] RA R. 08/29/2022 12:00 PM Medical Record Number: [id number] Patient Account Number: [id number] Date of Birth/Sex: Treating RN: 1935/10/19 (87 y.o. [name] Primary Care Provider: Clydie Braun Other Clinician: Referring Provider: Treating Provider/Extender: Sydnee Cabal in Treatment: 3 Information Obtained from: Patient Chief Complaint Skin tear right leg 07/24/22 Electronic Signature(s) Signed: 08/29/2022 12:41:45 PM By: Lenda Kelp PA-C Entered By: Lenda Kelp on 08/29/2022 12:41:45 -------------------------------------------------------------------------------- Debridement Details Patient Name: Date of Service: SA [name] RA R. 08/29/2022 12:00 PM Medical Record Number: [id number] Patient Account Number: [id number] Date of Birth/Sex: Treating RN: 1935/11/28 (87 y.o. [name] Primary Care Provider: Clydie Braun Other Clinician: Referring Provider: Treating Provider/Extender: Sydnee Cabal in Treatment: 3 Debridement Performed for Assessment: Wound #1 Right Lower Leg Performed By: Physician Nelida Meuse., PA-C Debridement Type: Debridement Level of Consciousness (Pre-procedure): Awake and Alert Pre-procedure Verification/Time Out Yes - 12:43 Taken: Pain Control: Lidocaine 4% T opical Solution T Area Debrided (L x W): otal 3.3 (cm) x 2.9 (cm) = 9.57 (cm) Tissue and other material debrided: Viable, Non-Viable, Slough, Subcutaneous, Slough Level: Skin/Subcutaneous Tissue Debridement Description: Excisional Instrument: Curette Bleeding: Moderate Hemostasis Achieved: Pressure Response to Treatment: Procedure was tolerated well Level of Consciousness (Post- Awake and Alert procedure): Post Debridement  Measurements of Total Wound Length: (cm) 3.3 Width: (cm) 2.9 Depth: (cm) 0.2 Volume: (cm) 1.503 Character of Wound/Ulcer Post Debridement: Stable Post Procedure Diagnosis Same as Pre-procedure Electronic Signature(s) Signed: 08/29/2022 3:38:07 PM By: Angelina Pih Signed: 08/29/2022 6:21:14 PM By: Allen Derry PA-C Entered By: Angelina Pih on 08/29/2022 12:45:50 HPI Details -------------------------------------------------------------------------------- [name] ([id number]) [id number] Page 2 of 6 Patient Name: Date of Service: [name] RA R. 08/29/2022 12:00 PM Medical Record Number: [id number] Patient Account Number: [id number] Date of Birth/Sex: Treating RN: 1935/12/10 (87 y.o. [name] Primary Care Provider: Clydie Braun Other Clinician: Referring Provider: Treating Provider/Extender: Sydnee Cabal in Treatment: 3 History of Present Illness HPI Description: 08-08-2022 upon evaluation today patient appears for initial inspection here in our clinic concerning issues that she has been having with a wound on her right anterior lower leg where she dropped a shredder on her leg causing a skin tear. Unfortunately this was attempted to be reapproximated but that has not been extremely effective for her. Fortunately there does not appear to be any signs of infection systemically which is great news. With that being said there is local infection definitely noted at this point. She has been on antibiotics. Currently cefdinir and doxycycline where the 2 antibiotics that were used up to this point. This original injury occurred on March 4. Her culture showed Enterococcus faecium and that was on 08-06-2022 that was resulted. Her primary care provider Dr. Sampson Goon who is also an infectious disease specialist that stated that if she was not better at the end of her current cefdinir and doxycycline regimen that he would  likely put her on Augmentin or amoxicillin. I think we may want to switch her to Augmentin at this point. Patient has a history of hypertension and peripheral vascular disease but really no other major medical problems at this point. With that being said although she did not show any signs of infection at this point I feel like this is something that might heal readily. However because of the  screen showing PAD here in the clinic on the ABIs if she does not show signs of improvement over the next that I think we may want to go ahead and see about getting her to vascular. Further evaluation and treatment. Will however see how things do over the next week. 08-15-2022 upon evaluation today patient appears to be doing well currently in regard to her wound. She has been tolerating the dressing changes and overall seems to be doing quite well with regard to her wound I am seeing some definite improvement here. With that being said I do believe that she is making good progress with regard to the Iodoflex which I think is cleaning up the surface of the wound quite well I am pleased in that regard. She has started on the Augmentin currently and seems to be doing very well with that. 08-22-2022 upon evaluation today patient appears to be doing well currently in regard to her wound. She is actually tolerating the dressing changes without complication. Fortunately there does not appear to be any signs of active infection at this time which is great news and overall I do believe that she is significantly improved with the Augmentin which is excellent as well. 08-29-2022 upon evaluation today patient appears to be doing well currently in regard to her wound which is actually making some pretty good progress here. I am extremely pleased with where we stand I think she is headed in the right direction and in general I do believe that we are making leaps and bounds towards getting this completely closed. With that being  said I do think it is time to go ahead and switch over to collagen in place of the Iodoflex I think you will feel more comfortable and I think it will help with additional tissue growth much more effectively at this point. Electronic Signature(s) Signed: 08/29/2022 1:18:11 PM By: Lenda Kelp PA-C Entered By: Lenda Kelp on 08/29/2022 13:18:11 -------------------------------------------------------------------------------- Physical Exam Details Patient Name: Date of Service: [name] RA R. 08/29/2022 12:00 PM Medical Record Number: [id number] Patient Account Number: [id number] Date of Birth/Sex: Treating RN: 1936-04-30 (87 y.o. [name] Primary Care Provider: Clydie Braun Other Clinician: Referring Provider: Treating Provider/Extender: Sydnee Cabal in Treatment: 3 Constitutional Obese and well-hydrated in no acute distress. Respiratory normal breathing without difficulty. Psychiatric this patient is able to make decisions and demonstrates good insight into disease process. Alert and Oriented x 3. pleasant and cooperative. Notes Upon inspection patient's wound bed actually showed signs again of need for sharp debridement but this was minimal compared to previous postdebridement this looks to be doing much better and actually very pleased with where we stand at this point. Electronic Signature(s) Signed: 08/29/2022 1:18:30 PM By: Lenda Kelp PA-C Entered By: Lenda Kelp on 08/29/2022 13:18:30 -------------------------------------------------------------------------------- Physician Orders Details Patient Name: Date of Service: SA [name] RA R. 08/29/2022 12:00 PM Medical Record Number: [id number] Patient Account Number: [id number] SONJI, STARKES ([id number]) [id number] Page 3 of 6 Date of Birth/Sex: Treating RN: April 23, 1936 (87 y.o. [name] Primary Care Provider: Clydie Braun Other  Clinician: Referring Provider: Treating Provider/Extender: Sydnee Cabal in Treatment: 3 Verbal / Phone Orders: No Diagnosis Coding Follow-up Appointments Return Appointment in 1 week. Bathing/ Shower/ Hygiene May shower; gently cleanse wound with antibacterial soap, rinse and pat dry prior to dressing wounds No tub bath. Anesthetic (Use 'Patient Medications' Section for Anesthetic Order Entry) Wound #1 Right Lower  Leg Lidocaine applied to wound bed Edema Control - Lymphedema / Segmental Compressive Device / Other Tubigrip single layer applied. - size D Elevate, Exercise Daily and A void Standing for Long Periods of Time. Elevate legs to the level of the heart and pump ankles as often as possible Elevate leg(s) parallel to the floor when sitting. DO YOUR BEST to sleep in the bed at night. DO NOT sleep in your recliner. Long hours of sitting in a recliner leads to swelling of the legs and/or potential wounds on your backside. Medications-Please add to medication list. ntibiotics - Continue antibiotics as prescribed P.O. A Wound Treatment Wound #1 - Lower Leg Wound Laterality: Right Cleanser: Soap and Water 3 x Per Week/30 Days Discharge Instructions: Gently cleanse wound with antibacterial soap, rinse and pat dry prior to dressing wounds Cleanser: Vashe 5.8 (oz) 3 x Per Week/30 Days Discharge Instructions: Use vashe 5.8 (oz) as directed Prim Dressing: Prisma 4.34 (in) (DME) (Generic) 3 x Per Week/30 Days ary Discharge Instructions: Moisten w/normal saline or sterile water; Cover wound as directed. Do not remove from wound bed. Secondary Dressing: (BORDER) Zetuvit Plus SILICONE BORDER Dressing 4x4 (in/in) (Generic) 3 x Per Week/30 Days Discharge Instructions: Please do not put silicone bordered dressings under wraps. Use non-bordered dressing only. Secured With: Tubigrip Size D, 3x10 (in/yd) 3 x Per Week/30 Days Discharge Instructions: single layer Electronic  Signature(s) Signed: 08/29/2022 3:38:07 PM By: Angelina Pih Signed: 08/29/2022 6:21:14 PM By: Allen Derry PA-C Previous Signature: 08/29/2022 12:28:48 PM Version By: Angelina Pih Entered By: Angelina Pih on 08/29/2022 12:54:38 -------------------------------------------------------------------------------- Problem List Details Patient Name: Date of Service: [name] RA R. 08/29/2022 12:00 PM Medical Record Number: [id number] Patient Account Number: [id number] Date of Birth/Sex: Treating RN: [birth year] (87 y.o. [name] Primary Care Provider: Clydie Braun Other Clinician: Referring Provider: Treating Provider/Extender: Sydnee Cabal in Treatment: 3 Active Problems ICD-10 Encounter Code Description Active Date MDM Diagnosis S81.811A Laceration without foreign body, right lower leg, initial encounter 08/08/2022 No Yes DORIANNA, MCKIVER ([id number]) [id number] Page 4 of 6 [phone] Non-pressure chronic ulcer of other part of right lower leg with fat layer 08/08/2022 No Yes exposed I73.89 Other specified peripheral vascular diseases 08/08/2022 No Yes I10 Essential (primary) hypertension 08/08/2022 No Yes Inactive Problems Resolved Problems Electronic Signature(s) Signed: 08/29/2022 12:41:40 PM By: Lenda Kelp PA-C Entered By: Lenda Kelp on 08/29/2022 12:41:40 -------------------------------------------------------------------------------- Progress Note Details Patient Name: Date of Service: SA [name] RA R. 08/29/2022 12:00 PM Medical Record Number: [id number] Patient Account Number: [id number] Date of Birth/Sex: Treating RN: 1936/05/01 (87 y.o. [name] Primary Care Provider: Clydie Braun Other Clinician: Referring Provider: Treating Provider/Extender: Sydnee Cabal in Treatment: 3 Subjective Chief Complaint Information obtained from Patient Skin tear right leg  07/24/22 History of Present Illness (HPI) 08-08-2022 upon evaluation today patient appears for initial inspection here in our clinic concerning issues that she has been having with a wound on her right anterior lower leg where she dropped a shredder on her leg causing a skin tear. Unfortunately this was attempted to be reapproximated but that has not been extremely effective for her. Fortunately there does not appear to be any signs of infection systemically which is great news. With that being said there is local infection definitely noted at this point. She has been on antibiotics. Currently cefdinir and doxycycline where the 2 antibiotics that were used up to this point. This original injury occurred on March 4.  Her culture showed Enterococcus faecium and that was on 08-06-2022 that was resulted. Her primary care provider Dr. Sampson GoonFitzgerald who is also an infectious disease specialist that stated that if she was not better at the end of her current cefdinir and doxycycline regimen that he would likely put her on Augmentin or amoxicillin. I think we may want to switch her to Augmentin at this point. Patient has a history of hypertension and peripheral vascular disease but really no other major medical problems at this point. With that being said although she did not show any signs of infection at this point I feel like this is something that might heal readily. However because of the screen showing PAD here in the clinic on the ABIs if she does not show signs of improvement over the next that I think we may want to go ahead and see about getting her to vascular. Further evaluation and treatment. Will however see how things do over the next week. 08-15-2022 upon evaluation today patient appears to be doing well currently in regard to her wound. She has been tolerating the dressing changes and overall seems to be doing quite well with regard to her wound I am seeing some definite improvement here. With that  being said I do believe that she is making good progress with regard to the Iodoflex which I think is cleaning up the surface of the wound quite well I am pleased in that regard. She has started on the Augmentin currently and seems to be doing very well with that. 08-22-2022 upon evaluation today patient appears to be doing well currently in regard to her wound. She is actually tolerating the dressing changes without complication. Fortunately there does not appear to be any signs of active infection at this time which is great news and overall I do believe that she is significantly improved with the Augmentin which is excellent as well. 08-29-2022 upon evaluation today patient appears to be doing well currently in regard to her wound which is actually making some pretty good progress here. I am extremely pleased with where we stand I think she is headed in the right direction and in general I do believe that we are making leaps and bounds towards getting this completely closed. With that being said I do think it is time to go ahead and switch over to collagen in place of the Iodoflex I think you will feel more comfortable and I think it will help with additional tissue growth much more effectively at this point. Objective Constitutional Obese and well-hydrated in no acute distress. Manasi BoysSAUNDERS, Reisa R ([id number]) [id number] Page 5 of 6 Vitals Time Taken: 12:09 PM, Height: 69 in, Weight: 194 lbs, BMI: 28.6, Temperature: 98.2 F, Pulse: 69 bpm, Respiratory Rate: 18 breaths/min, Blood Pressure: 142/70 mmHg. Respiratory normal breathing without difficulty. Psychiatric this patient is able to make decisions and demonstrates good insight into disease process. Alert and Oriented x 3. pleasant and cooperative. General Notes: Upon inspection patient's wound bed actually showed signs again of need for sharp debridement but this was minimal compared to previous postdebridement this  looks to be doing much better and actually very pleased with where we stand at this point. Integumentary (Hair, Skin) Wound #1 status is Open. Original cause of wound was Trauma. The date acquired was: 07/24/2022. The wound has been in treatment 3 weeks. The wound is located on the Right Lower Leg. The wound measures 3.3cm length x 2.9cm width x 0.2cm depth; 7.516cm^2 area and 1.503cm^3  volume. There is Fat Layer (Subcutaneous Tissue) exposed. There is no tunneling or undermining noted. There is a medium amount of serosanguineous drainage noted. There is medium (34-66%) red, pink granulation within the wound bed. There is a medium (34-66%) amount of necrotic tissue within the wound bed including Adherent Slough. Assessment Active Problems ICD-10 Laceration without foreign body, right lower leg, initial encounter Non-pressure chronic ulcer of other part of right lower leg with fat layer exposed Other specified peripheral vascular diseases Essential (primary) hypertension Procedures Wound #1 Pre-procedure diagnosis of Wound #1 is a Skin T located on the Right Lower Leg . There was a Excisional Skin/Subcutaneous Tissue Debridement with a ear total area of 9.57 sq cm performed by Nelida Meuse., PA-C. With the following instrument(s): Curette to remove Viable and Non-Viable tissue/material. Material removed includes Subcutaneous Tissue and Slough and after achieving pain control using Lidocaine 4% T opical Solution. No specimens were taken. A time out was conducted at 12:43, prior to the start of the procedure. A Moderate amount of bleeding was controlled with Pressure. The procedure was tolerated well. Post Debridement Measurements: 3.3cm length x 2.9cm width x 0.2cm depth; 1.503cm^3 volume. Character of Wound/Ulcer Post Debridement is stable. Post procedure Diagnosis Wound #1: Same as Pre-Procedure Plan Follow-up Appointments: Return Appointment in 1 week. Bathing/ Shower/ Hygiene: May shower;  gently cleanse wound with antibacterial soap, rinse and pat dry prior to dressing wounds No tub bath. Anesthetic (Use 'Patient Medications' Section for Anesthetic Order Entry): Wound #1 Right Lower Leg: Lidocaine applied to wound bed Edema Control - Lymphedema / Segmental Compressive Device / Other: Tubigrip single layer applied. - size D Elevate, Exercise Daily and Avoid Standing for Long Periods of Time. Elevate legs to the level of the heart and pump ankles as often as possible Elevate leg(s) parallel to the floor when sitting. DO YOUR BEST to sleep in the bed at night. DO NOT sleep in your recliner. Long hours of sitting in a recliner leads to swelling of the legs and/or potential wounds on your backside. Medications-Please add to medication list.: P.O. Antibiotics - Continue antibiotics as prescribed WOUND #1: - Lower Leg Wound Laterality: Right Cleanser: Soap and Water 3 x Per Week/30 Days Discharge Instructions: Gently cleanse wound with antibacterial soap, rinse and pat dry prior to dressing wounds Cleanser: Vashe 5.8 (oz) 3 x Per Week/30 Days Discharge Instructions: Use vashe 5.8 (oz) as directed Prim Dressing: Prisma 4.34 (in) (DME) (Generic) 3 x Per Week/30 Days ary Discharge Instructions: Moisten w/normal saline or sterile water; Cover wound as directed. Do not remove from wound bed. Secondary Dressing: (BORDER) Zetuvit Plus SILICONE BORDER Dressing 4x4 (in/in) (Generic) 3 x Per Week/30 Days Discharge Instructions: Please do not put silicone bordered dressings under wraps. Use non-bordered dressing only. Secured With: Tubigrip Size D, 3x10 (in/yd) 3 x Per Week/30 Days Discharge Instructions: single layer DANDREA, MEDDERS ([id number]) [id number] Page 6 of 6 1. I would recommend that we have the patient continue to monitor for any signs of infection or worsening. Based on what I am seeing I do believe that he is making really good progress here. 2.  I am also can recommend the patient should continue to utilize the Prisma at this point followed by the Zetuvit bordered foam dressing she is using Tubigrip at this time. We will see patient back for reevaluation in 1 week here in the clinic. If anything worsens or changes patient will contact our office for additional recommendations. Electronic Signature(s) Signed: 08/29/2022  1:18:52 PM By: Lenda Kelp PA-C Entered By: Lenda Kelp on 08/29/2022 13:18:52 -------------------------------------------------------------------------------- SuperBill Details Patient Name: Date of Service: SA [name] RA R. 08/29/2022 Medical Record Number: [id number] Patient Account Number: [id number] Date of Birth/Sex: Treating RN: 12-09-1935 (87 y.o. [name] Primary Care Provider: Clydie Braun Other Clinician: Referring Provider: Treating Provider/Extender: Sydnee Cabal in Treatment: 3 Diagnosis Coding ICD-10 Codes Code Description [phone] Laceration without foreign body, right lower leg, initial encounter L97.812 Non-pressure chronic ulcer of other part of right lower leg with fat layer exposed I73.89 Other specified peripheral vascular diseases I10 Essential (primary) hypertension Facility Procedures : CPT4 Code: [id number] Description: 11042 - DEB SUBQ TISSUE 20 SQ CM/< ICD-10 Diagnosis Description L97.812 Non-pressure chronic ulcer of other part of right lower leg with fat layer expo Modifier: sed Quantity: 1 Physician Procedures : CPT4 Code Description Modifier [id number] 11042 - WC PHYS SUBQ TISS 20 SQ CM ICD-10 Diagnosis Description L97.812 Non-pressure chronic ulcer of other part of right lower leg with fat layer exposed Quantity: 1 Electronic Signature(s) Signed: 08/29/2022 1:19:04 PM By: Lenda Kelp PA-C Entered By: Lenda Kelp on 08/29/2022 13:19:04

## 2022-08-31 DIAGNOSIS — Z96653 Presence of artificial knee joint, bilateral: Secondary | ICD-10-CM | POA: Diagnosis not present

## 2022-08-31 DIAGNOSIS — E7801 Familial hypercholesterolemia: Secondary | ICD-10-CM | POA: Diagnosis not present

## 2022-08-31 DIAGNOSIS — R600 Localized edema: Secondary | ICD-10-CM | POA: Diagnosis not present

## 2022-08-31 DIAGNOSIS — I10 Essential (primary) hypertension: Secondary | ICD-10-CM | POA: Diagnosis not present

## 2022-08-31 DIAGNOSIS — D649 Anemia, unspecified: Secondary | ICD-10-CM | POA: Diagnosis not present

## 2022-08-31 DIAGNOSIS — I6330 Cerebral infarction due to thrombosis of unspecified cerebral artery: Secondary | ICD-10-CM | POA: Diagnosis not present

## 2022-09-05 ENCOUNTER — Encounter: Payer: HMO | Admitting: Physician Assistant

## 2022-09-05 DIAGNOSIS — G5632 Lesion of radial nerve, left upper limb: Secondary | ICD-10-CM | POA: Diagnosis not present

## 2022-09-05 DIAGNOSIS — S81811A Laceration without foreign body, right lower leg, initial encounter: Secondary | ICD-10-CM | POA: Diagnosis not present

## 2022-09-05 NOTE — Progress Notes (Signed)
ABYGAIL, GALENO ([id number]) [id number] Page 1 of 6 Visit Report for 09/05/2022 Chief Complaint Document Details Patient Name: Date of Service: [name]. 09/05/2022 3:00 PM Medical Record Number: [id number] Patient Account Number: [id number] Date of Birth/Sex: Treating RN: 1935/11/22 (87 y.o. [name] Primary Care Provider: Clydie Braun Other Clinician: Referring Provider: Treating Provider/Extender: Sydnee Cabal in Treatment: 4 Information Obtained from: Patient Chief Complaint Skin tear right leg 07/24/22 Electronic Signature(s) Signed: 09/05/2022 3:20:58 PM By: Allen Derry PA-C Entered By: Allen Derry on 09/05/2022 15:20:57 -------------------------------------------------------------------------------- HPI Details Patient Name: Date of Service: [name] 09/05/2022 3:00 PM Medical Record Number: [id number] Patient Account Number: [id number] Date of Birth/Sex: Treating RN: 06/19/1935 (87 y.o. [name] Primary Care Provider: Clydie Braun Other Clinician: Referring Provider: Treating Provider/Extender: Sydnee Cabal in Treatment: 4 History of Present Illness HPI Description: 08-08-2022 upon evaluation today patient appears for initial inspection here in our clinic concerning issues that she has been having with a wound on her right anterior lower leg where she dropped a shredder on her leg causing a skin tear. Unfortunately this was attempted to be reapproximated but that has not been extremely effective for her. Fortunately there does not appear to be any signs of infection systemically which is great news. With that being said there is local infection definitely noted at this point. She has been on antibiotics. Currently cefdinir and doxycycline where the 2 antibiotics that were used up to this point. This original injury occurred on March 4. Her culture showed  Enterococcus faecium and that was on 08-06-2022 that was resulted. Her primary care provider Dr. Sampson Goon who is also an infectious disease specialist that stated that if she was not better at the end of her current cefdinir and doxycycline regimen that he would likely put her on Augmentin or amoxicillin. I think we may want to switch her to Augmentin at this point. Patient has a history of hypertension and peripheral vascular disease but really no other major medical problems at this point. With that being said although she did not show any signs of infection at this point I feel like this is something that might heal readily. However because of the screen showing PAD here in the clinic on the ABIs if she does not show signs of improvement over the next that I think we may want to go ahead and see about getting her to vascular. Further evaluation and treatment. Will however see how things do over the next week. 08-15-2022 upon evaluation today patient appears to be doing well currently in regard to her wound. She has been tolerating the dressing changes and overall seems to be doing quite well with regard to her wound I am seeing some definite improvement here. With that being said I do believe that she is making good progress with regard to the Iodoflex which I think is cleaning up the surface of the wound quite well I am pleased in that regard. She has started on the Augmentin currently and seems to be doing very well with that. 08-22-2022 upon evaluation today patient appears to be doing well currently in regard to her wound. She is actually tolerating the dressing changes without complication. Fortunately there does not appear to be any signs of active infection at this time which is great news and overall I do believe that she is significantly improved with the Augmentin which is excellent as well. 08-29-2022 upon evaluation today  patient appears to be doing well currently in regard to her wound  which is actually making some pretty good progress here. I am extremely pleased with where we stand I think she is headed in the right direction and in general I do believe that we are making leaps and bounds towards getting this completely closed. With that being said I do think it is time to go ahead and switch over to collagen in place of the Iodoflex I think you will feel more comfortable and I think it will help with additional tissue growth much more effectively at this point. 09-05-2022 upon evaluation today patient appears to be doing well currently in regard to her wound which is actually showing signs of excellent improvement. Fortunately I do not see any evidence of active infection locally nor systemically at this time which is great news. Electronic Signature(s) Signed: 09/05/2022 3:26:39 PM By: Allen Derry PA-C Entered By: Allen Derry on 09/05/2022 15:26:38 Physical Exam Details -------------------------------------------------------------------------------- [name] ([id number]) [id number] Page 2 of 6 Patient Name: Date of Service: [name] 09/05/2022 3:00 PM Medical Record Number: [id number] Patient Account Number: [id number] Date of Birth/Sex: Treating RN: 01-Aug-1935 (87 y.o. [name] Primary Care Provider: Clydie Braun Other Clinician: Referring Provider: Treating Provider/Extender: Sydnee Cabal in Treatment: 4 Constitutional Well-nourished and well-hydrated in no acute distress. Respiratory normal breathing without difficulty. Psychiatric this patient is able to make decisions and demonstrates good insight into disease process. Alert and Oriented x 3. pleasant and cooperative. Notes Upon inspection patient's wound bed actually showed signs of good granulation epithelization at this point. Fortunately I do not see any evidence of active infection locally nor systemically which is great  news and overall I am extremely pleased with where we stand today. I do not see any need for sharp debridement at this point. Electronic Signature(s) Signed: 09/05/2022 3:27:03 PM By: Allen Derry PA-C Entered By: Allen Derry on 09/05/2022 15:27:03 -------------------------------------------------------------------------------- Physician Orders Details Patient Name: Date of Service: [name] 09/05/2022 3:00 PM Medical Record Number: [id number] Patient Account Number: [id number] Date of Birth/Sex: Treating RN: 10/18/1935 (87 y.o. [name] Primary Care Provider: Clydie Braun Other Clinician: Referring Provider: Treating Provider/Extender: Sydnee Cabal in Treatment: 4 Verbal / Phone Orders: No Diagnosis Coding ICD-10 Coding Code Description [id number] Laceration without foreign body, right lower leg, initial encounter L97.812 Non-pressure chronic ulcer of other part of right lower leg with fat layer exposed I73.89 Other specified peripheral vascular diseases I10 Essential (primary) hypertension Follow-up Appointments Return Appointment in 1 week. Bathing/ Shower/ Hygiene May shower; gently cleanse wound with antibacterial soap, rinse and pat dry prior to dressing wounds No tub bath. Anesthetic (Use 'Patient Medications' Section for Anesthetic Order Entry) Wound #1 Right Lower Leg Lidocaine applied to wound bed Edema Control - Lymphedema / Segmental Compressive Device / Other Tubigrip single layer applied. - size D Elevate, Exercise Daily and A void Standing for Long Periods of Time. Elevate legs to the level of the heart and pump ankles as often as possible Elevate leg(s) parallel to the floor when sitting. DO YOUR BEST to sleep in the bed at night. DO NOT sleep in your recliner. Long hours of sitting in a recliner leads to swelling of the legs and/or potential wounds on your backside. Medications-Please add to medication  list. ntibiotics - Continue antibiotics as prescribed P.O. A Wound Treatment Wound #1 - Lower Leg Wound Laterality: Right Cleanser: Soap  and Water 3 x Per Week/30 Days Discharge Instructions: Gently cleanse wound with antibacterial soap, rinse and pat dry prior to dressing wounds CYRENA, KUCHENBECKER ([id number]) [id number] Page 3 of 6 Cleanser: Vashe 5.8 (oz) 3 x Per Week/30 Days Discharge Instructions: Use vashe 5.8 (oz) as directed Prim Dressing: Prisma 4.34 (in) (Generic) 3 x Per Week/30 Days ary Discharge Instructions: Moisten w/normal saline or sterile water; Cover wound as directed. Do not remove from wound bed. Secondary Dressing: (BORDER) Zetuvit Plus SILICONE BORDER Dressing 4x4 (in/in) (Generic) 3 x Per Week/30 Days Discharge Instructions: Please do not put silicone bordered dressings under wraps. Use non-bordered dressing only. Secured With: Tubigrip Size D, 3x10 (in/yd) 3 x Per Week/30 Days Discharge Instructions: single layer Electronic Signature(s) Signed: 09/05/2022 3:41:48 PM By: Angelina Pih Signed: 09/05/2022 4:53:35 PM By: Allen Derry PA-C Entered By: Angelina Pih on 09/05/2022 15:25:07 -------------------------------------------------------------------------------- Problem List Details Patient Name: Date of Service: Lavell Anchors RA R. 09/05/2022 3:00 PM Medical Record Number: [id number] Patient Account Number: [id number] Date of Birth/Sex: Treating RN: [birth year] (87 y.o. [name] Primary Care Provider: Clydie Braun Other Clinician: Referring Provider: Treating Provider/Extender: Sydnee Cabal in Treatment: 4 Active Problems ICD-10 Encounter Code Description Active Date MDM Diagnosis S81.811A Laceration without foreign body, right lower leg, initial encounter 08/08/2022 No Yes L97.812 Non-pressure chronic ulcer of other part of right lower leg with fat layer 08/08/2022 No Yes exposed I73.89  Other specified peripheral vascular diseases 08/08/2022 No Yes I10 Essential (primary) hypertension 08/08/2022 No Yes Inactive Problems Resolved Problems Electronic Signature(s) Signed: 09/05/2022 3:20:55 PM By: Allen Derry PA-C Entered By: Allen Derry on 09/05/2022 15:20:54 -------------------------------------------------------------------------------- Progress Note Details Patient Name: Date of Service: [name] 09/05/2022 3:00 PM Medical Record Number: [id number] Patient Account Number: [id number] Date of Birth/Sex: Treating RN: 1936/02/01 (87 y.o. [name] Primary Care Provider: Clydie Braun Other Clinician: Referring Provider: Treating Provider/Extender: Sydnee Cabal in Treatment: 4 ESTHER, BRADSTREET R ([id number]) [id number] Page 4 of 6 Subjective Chief Complaint Information obtained from Patient Skin tear right leg 07/24/22 History of Present Illness (HPI) 08-08-2022 upon evaluation today patient appears for initial inspection here in our clinic concerning issues that she has been having with a wound on her right anterior lower leg where she dropped a shredder on her leg causing a skin tear. Unfortunately this was attempted to be reapproximated but that has not been extremely effective for her. Fortunately there does not appear to be any signs of infection systemically which is great news. With that being said there is local infection definitely noted at this point. She has been on antibiotics. Currently cefdinir and doxycycline where the 2 antibiotics that were used up to this point. This original injury occurred on March 4. Her culture showed Enterococcus faecium and that was on 08-06-2022 that was resulted. Her primary care provider Dr. Sampson Goon who is also an infectious disease specialist that stated that if she was not better at the end of her current cefdinir and doxycycline regimen that he would likely  put her on Augmentin or amoxicillin. I think we may want to switch her to Augmentin at this point. Patient has a history of hypertension and peripheral vascular disease but really no other major medical problems at this point. With that being said although she did not show any signs of infection at this point I feel like this is something that might heal readily. However because of the screen showing PAD here  in the clinic on the ABIs if she does not show signs of improvement over the next that I think we may want to go ahead and see about getting her to vascular. Further evaluation and treatment. Will however see how things do over the next week. 08-15-2022 upon evaluation today patient appears to be doing well currently in regard to her wound. She has been tolerating the dressing changes and overall seems to be doing quite well with regard to her wound I am seeing some definite improvement here. With that being said I do believe that she is making good progress with regard to the Iodoflex which I think is cleaning up the surface of the wound quite well I am pleased in that regard. She has started on the Augmentin currently and seems to be doing very well with that. 08-22-2022 upon evaluation today patient appears to be doing well currently in regard to her wound. She is actually tolerating the dressing changes without complication. Fortunately there does not appear to be any signs of active infection at this time which is great news and overall I do believe that she is significantly improved with the Augmentin which is excellent as well. 08-29-2022 upon evaluation today patient appears to be doing well currently in regard to her wound which is actually making some pretty good progress here. I am extremely pleased with where we stand I think she is headed in the right direction and in general I do believe that we are making leaps and bounds towards getting this completely closed. With that being said I do  think it is time to go ahead and switch over to collagen in place of the Iodoflex I think you will feel more comfortable and I think it will help with additional tissue growth much more effectively at this point. 09-05-2022 upon evaluation today patient appears to be doing well currently in regard to her wound which is actually showing signs of excellent improvement. Fortunately I do not see any evidence of active infection locally nor systemically at this time which is great news. Objective Constitutional Well-nourished and well-hydrated in no acute distress. Vitals Time Taken: 3:03 PM, Height: 69 in, Weight: 194 lbs, BMI: 28.6, Temperature: 97.9 F, Pulse: 74 bpm, Respiratory Rate: 18 breaths/min, Blood Pressure: 132/75 mmHg. Respiratory normal breathing without difficulty. Psychiatric this patient is able to make decisions and demonstrates good insight into disease process. Alert and Oriented x 3. pleasant and cooperative. General Notes: Upon inspection patient's wound bed actually showed signs of good granulation epithelization at this point. Fortunately I do not see any evidence of active infection locally nor systemically which is great news and overall I am extremely pleased with where we stand today. I do not see any need for sharp debridement at this point. Integumentary (Hair, Skin) Wound #1 status is Open. Original cause of wound was Trauma. The date acquired was: 07/24/2022. The wound has been in treatment 4 weeks. The wound is located on the Right Lower Leg. The wound measures 3cm length x 2.2cm width x 0.2cm depth; 5.184cm^2 area and 1.037cm^3 volume. There is Fat Layer (Subcutaneous Tissue) exposed. There is no tunneling or undermining noted. There is a medium amount of serosanguineous drainage noted. There is medium (34-66%) red, pink granulation within the wound bed. There is a medium (34-66%) amount of necrotic tissue within the wound bed including Adherent  Slough. Assessment Active Problems ICD-10 Laceration without foreign body, right lower leg, initial encounter Non-pressure chronic ulcer of other part of  right lower leg with fat layer exposed Other specified peripheral vascular diseases Essential (primary) hypertension VALMAI, VANDENBERGHE R ([id number]) [id number] Page 5 of 6 Plan Follow-up Appointments: Return Appointment in 1 week. Bathing/ Shower/ Hygiene: May shower; gently cleanse wound with antibacterial soap, rinse and pat dry prior to dressing wounds No tub bath. Anesthetic (Use 'Patient Medications' Section for Anesthetic Order Entry): Wound #1 Right Lower Leg: Lidocaine applied to wound bed Edema Control - Lymphedema / Segmental Compressive Device / Other: Tubigrip single layer applied. - size D Elevate, Exercise Daily and Avoid Standing for Long Periods of Time. Elevate legs to the level of the heart and pump ankles as often as possible Elevate leg(s) parallel to the floor when sitting. DO YOUR BEST to sleep in the bed at night. DO NOT sleep in your recliner. Long hours of sitting in a recliner leads to swelling of the legs and/or potential wounds on your backside. Medications-Please add to medication list.: P.O. Antibiotics - Continue antibiotics as prescribed WOUND #1: - Lower Leg Wound Laterality: Right Cleanser: Soap and Water 3 x Per Week/30 Days Discharge Instructions: Gently cleanse wound with antibacterial soap, rinse and pat dry prior to dressing wounds Cleanser: Vashe 5.8 (oz) 3 x Per Week/30 Days Discharge Instructions: Use vashe 5.8 (oz) as directed Prim Dressing: Prisma 4.34 (in) (Generic) 3 x Per Week/30 Days ary Discharge Instructions: Moisten w/normal saline or sterile water; Cover wound as directed. Do not remove from wound bed. Secondary Dressing: (BORDER) Zetuvit Plus SILICONE BORDER Dressing 4x4 (in/in) (Generic) 3 x Per Week/30 Days Discharge Instructions: Please do not put  silicone bordered dressings under wraps. Use non-bordered dressing only. Secured With: Tubigrip Size D, 3x10 (in/yd) 3 x Per Week/30 Days Discharge Instructions: single layer 1. I would recommend that we have the patient continue to monitor for any signs of infection or worsening. Obviously if anything changes she will contact the office and let me know. 2. I am good recommend as well that we should continue with the collagen I think this is doing much better she is also not having any of the burning that she was previously experiencing this is great news. We will see patient back for reevaluation in 1 week here in the clinic. If anything worsens or changes patient will contact our office for additional recommendations. Electronic Signature(s) Signed: 09/05/2022 3:29:26 PM By: Allen Derry PA-C Entered By: Allen Derry on 09/05/2022 15:29:26 -------------------------------------------------------------------------------- SuperBill Details Patient Name: Date of Service: [name] 09/05/2022 Medical Record Number: [id number] Patient Account Number: [id number] Date of Birth/Sex: Treating RN: 01/31/1936 (87 y.o. [name] Primary Care Provider: Clydie Braun Other Clinician: Referring Provider: Treating Provider/Extender: Sydnee Cabal in Treatment: 4 Diagnosis Coding ICD-10 Codes Code Description [id number] Laceration without foreign body, right lower leg, initial encounter L97.812 Non-pressure chronic ulcer of other part of right lower leg with fat layer exposed I73.89 Other specified peripheral vascular diseases I10 Essential (primary) hypertension Facility Procedures : CPT4 Code: [id number] Description: 99213 - WOUND CARE VISIT-LEV 3 EST PT Modifier: Quantity: 1 Physician Procedures : CPT4 Code Description Modifier [id number] 99213 - WC PHYS LEVEL 3 - EST PT ICD-10 Diagnosis Description DMYA, LONG ([id number])  [id number] P [id number] Laceration without foreign body, right lower leg, initial encounter  L97.812 Non-pressure chronic ulcer of other part of right lower leg with fat layer exposed I73.89 Other specified peripheral vascular diseases I10 Essential (primary) hypertension Quantity: 1 age 27 of 8 Electronic Signature(s) Signed: 09/05/2022 3:36:02 PM  By: Angelina Pih Signed: 09/05/2022 4:53:35 PM By: Allen Derry PA-C Previous Signature: 09/05/2022 3:35:44 PM Version By: Angelina Pih Previous Signature: 09/05/2022 3:31:08 PM Version By: Allen Derry PA-C Entered By: Angelina Pih on 09/05/2022 15:36:02

## 2022-09-05 NOTE — Progress Notes (Signed)
YERALDIN, LITZENBERGER ([id number]) [id number] Page 1 of 7 Visit Report for 09/05/2022 Arrival Information Details Patient Name: Date of Service: [name] RA R. 09/05/2022 3:00 PM Medical Record Number: [id number] Patient Account Number: [id number] Date of Birth/Sex: Treating RN: November 09, 1935 (87 y.o. [name] Primary Care Nahshon Reich: Clydie Braun Other Clinician: Referring Favor Hackler: Treating Raekwon Winkowski/Extender: Sydnee Cabal in Treatment: 4 Visit Information History Since Last Visit Added or deleted any medications: No Patient Arrived: Ambulatory Any new allergies or adverse reactions: No Arrival Time: 15:00 Had a fall or experienced change in No Accompanied By: self activities of daily living that may affect Transfer Assistance: None risk of falls: Patient Identification Verified: Yes Hospitalized since last visit: No Secondary Verification Process Completed: Yes Has Dressing in Place as Prescribed: Yes Patient Has Alerts: Yes Has Compression in Place as Prescribed: Yes Patient Alerts: Patient on Blood Thinner Pain Present Now: No 08/08/22 ABI Right PAD 08/08/22 ABI Left PAD Electronic Signature(s) Signed: 09/05/2022 3:41:48 PM By: Angelina Pih Entered By: Angelina Pih on 09/05/2022 15:03:46 -------------------------------------------------------------------------------- Clinic Level of Care Assessment Details Patient Name: Date of Service: [name] 09/05/2022 3:00 PM Medical Record Number: [id number] Patient Account Number: [id number] Date of Birth/Sex: Treating RN: 1936-02-11 (87 y.o. [name] Primary Care Shondrika Hoque: Clydie Braun Other Clinician: Referring Kimblery Diop: Treating Karlita Lichtman/Extender: Sydnee Cabal in Treatment: 4 Clinic Level of Care Assessment Items TOOL 4 Quantity Score  - 0 Use when only an EandM is performed on FOLLOW-UP visit ASSESSMENTS  - Nursing Assessment / Reassessment X- 1 10 Reassessment of Co-morbidities (includes updates in patient status) X- 1 5 Reassessment of Adherence to Treatment Plan ASSESSMENTS - Wound and Skin A ssessment / Reassessment X - Simple Wound Assessment / Reassessment - one wound 1 5  - 0 Complex Wound Assessment / Reassessment - multiple wounds  - 0 Dermatologic / Skin Assessment (not related to wound area) ASSESSMENTS - Focused Assessment  - 0 Circumferential Edema Measurements - multi extremities  - 0 Nutritional Assessment / Counseling / Intervention  - 0 Lower Extremity Assessment (monofilament, tuning fork, pulses)  - 0 Peripheral Arterial Disease Assessment (using hand held doppler) ASSESSMENTS - Ostomy and/or Continence Assessment and Care  - 0 Incontinence Assessment and Management  - 0 Ostomy Care Assessment and Management (repouching, etc.) PROCESS - Coordination of Care X - Simple Patient / Family Education for ongoing care 1 15  - 0 Complex (extensive) Patient / Family Education for ongoing care [name], CONTEH R ([id number]) [id number] Page 2 of 7 X- 1 10 Staff obtains Consents, Records, T Results / Process Orders est  - 0 Staff telephones HHA, Nursing Homes / Clarify orders / etc  - 0 Routine Transfer to another Facility (non-emergent condition)  - 0 Routine Hospital Admission (non-emergent condition)  - 0 New Admissions / Manufacturing engineer / Ordering NPWT Apligraf, etc. ,  - 0 Emergency Hospital Admission (emergent condition) X- 1 10 Simple Discharge Coordination  - 0 Complex (extensive) Discharge Coordination PROCESS - Special Needs  - 0 Pediatric / Minor Patient Management  - 0 Isolation Patient Management  - 0 Hearing / Language / Visual special needs  - 0 Assessment of Community assistance (transportation, D/C planning, etc.)  - 0 Additional assistance / Altered  mentation  - 0 Support Surface(s) Assessment (bed, cushion, seat, etc.) INTERVENTIONS - Wound Cleansing / Measurement X - Simple Wound Cleansing - one wound 1 5  - 0 Complex Wound Cleansing -  multiple wounds X- 1 5 Wound Imaging (photographs - any number of wounds) []  - 0 Wound Tracing (instead of photographs) X- 1 5 Simple Wound Measurement - one wound []  - 0 Complex Wound Measurement - multiple wounds INTERVENTIONS - Wound Dressings X - Small Wound Dressing one or multiple wounds 1 10 []  - 0 Medium Wound Dressing one or multiple wounds []  - 0 Large Wound Dressing one or multiple wounds X- 1 5 Application of Medications - topical []  - 0 Application of Medications - injection INTERVENTIONS - Miscellaneous []  - 0 External ear exam []  - 0 Specimen Collection (cultures, biopsies, blood, body fluids, etc.) []  - 0 Specimen(s) / Culture(s) sent or taken to Lab for analysis []  - 0 Patient Transfer (multiple staff / Nurse, adult / Similar devices) []  - 0 Simple Staple / Suture removal (25 or less) []  - 0 Complex Staple / Suture removal (26 or more) []  - 0 Hypo / Hyperglycemic Management (close monitor of Blood Glucose) []  - 0 Ankle / Brachial Index (ABI) - do not check if billed separately X- 1 5 Vital Signs Has the patient been seen at the hospital within the last three years: Yes Total Score: 90 Level Of Care: New/Established - Level 3 Electronic Signature(s) Signed: 09/05/2022 3:41:48 PM By: Angelina Pih Entered By: Angelina Pih on 09/05/2022 15:35:35 Angelita Boys R ([id number]) [id number] Page 3 of 7 -------------------------------------------------------------------------------- Encounter Discharge Information Details Patient Name: Date of Service: [name]. 09/05/2022 3:00 PM Medical Record Number: [id number] Patient Account Number: [id number] Date of Birth/Sex: Treating RN: [birth year] (87 y.o. [name] Primary Care Lorianna Spadaccini: Clydie Braun Other Clinician: Referring Lasonya Hubner: Treating Abram Sax/Extender: Sydnee Cabal in Treatment: 4 Encounter Discharge Information Items Discharge Condition: Stable Ambulatory Status: Ambulatory Discharge Destination: Home Transportation: Private Auto Accompanied By: self Schedule Follow-up Appointment: Yes Clinical Summary of Care: Electronic Signature(s) Signed: 09/05/2022 3:38:12 PM By: Angelina Pih Entered By: Angelina Pih on 09/05/2022 15:38:11 -------------------------------------------------------------------------------- Lower Extremity Assessment Details Patient Name: Date of Service: [name] RA R. 09/05/2022 3:00 PM Medical Record Number: [id number] Patient Account Number: [id number] Date of Birth/Sex: Treating RN: 1935-09-30 (87 y.o. [name] Primary Care Tamalyn Wadsworth: Clydie Braun Other Clinician: Referring Citlally Captain: Treating Quante Pettry/Extender: Sydnee Cabal in Treatment: 4 Edema Assessment Assessed: [Left: No] [Right: No] Edema: [Left: Ye] [Right: s] Calf Left: Right: Point of Measurement: 34 cm From Medial Instep 42 cm Ankle Left: Right: Point of Measurement: 11 cm From Medial Instep 25 cm Vascular Assessment Pulses: Dorsalis Pedis Palpable: [Right:Yes] Posterior Tibial Palpable: [Right:Yes] Electronic Signature(s) Signed: 09/05/2022 3:41:48 PM By: Angelina Pih Entered By: Angelina Pih on 09/05/2022 15:09:09 -------------------------------------------------------------------------------- Multi Wound Chart Details Patient Name: Date of Service: [name], Linwood Dibbles RA R. 09/05/2022 3:00 PM Medical Record Number: [id number] Patient Account Number: [id number] Date of Birth/Sex: Treating RN: 03-05-1936 (87 y.o. [name] Primary Care Kaily Wragg: Clydie Braun Other Clinician: Referring Harshini Trent: Treating Harley Mccartney/Extender: Sydnee Cabal in Treatment: 4 KIRAN, LAPINE R ([id number]) [id number] Page 4 of 7 Vital Signs Height(in): 69 Pulse(bpm): 74 Weight(lbs): 194 Blood Pressure(mmHg): 132/75 Body Mass Index(BMI): 28.6 Temperature(F): 97.9 Respiratory Rate(breaths/min): 18 [1:Photos:] [N/A:N/A] Right Lower Leg N/A N/A Wound Location: Trauma N/A N/A Wounding Event: Skin Tear N/A N/A Primary Etiology: Anemia, Hypertension, Osteoarthritis N/A N/A Comorbid History: 07/24/2022 N/A N/A Date Acquired: 4 N/A N/A Weeks of Treatment: Open N/A N/A Wound Status: No N/A N/A Wound Recurrence: 3x2.2x0.2 N/A N/A Measurements L x  W x D (cm) 5.184 N/A N/A A (cm) : rea 1.037 N/A N/A Volume (cm) : 26.60% N/A N/A % Reduction in A rea: -46.90% N/A N/A % Reduction in Volume: Full Thickness Without Exposed N/A N/A Classification: Support Structures Medium N/A N/A Exudate A mount: Serosanguineous N/A N/A Exudate Type: red, brown N/A N/A Exudate Color: Medium (34-66%) N/A N/A Granulation A mount: Red, Pink N/A N/A Granulation Quality: Medium (34-66%) N/A N/A Necrotic A mount: Fat Layer (Subcutaneous Tissue): Yes N/A N/A Exposed Structures: None N/A N/A Epithelialization: Treatment Notes Electronic Signature(s) Signed: 09/05/2022 3:41:48 PM By: Angelina Pih Entered By: Angelina Pih on 09/05/2022 15:24:11 -------------------------------------------------------------------------------- Multi-Disciplinary Care Plan Details Patient Name: Date of Service: [name], Linwood Dibbles RA R. 09/05/2022 3:00 PM Medical Record Number: [id number] Patient Account Number: [id number] Date of Birth/Sex: Treating RN: 1935-11-16 (87 y.o. [name] Primary Care Zeddie Njie: Clydie Braun Other Clinician: Referring Dara Camargo: Treating Tyquez Hollibaugh/Extender: Sydnee Cabal in Treatment: 4 Active Inactive Wound/Skin Impairment Nursing  Diagnoses: Impaired tissue integrity Knowledge deficit related to ulceration/compromised skin integrity Goals: Ulcer/skin breakdown will have a volume reduction of 30% by week 4 Date Initiated: 08/08/2022 Date Inactivated: 09/05/2022 Target Resolution Date: 09/05/2022 Unmet Reason: slow wound healing, is Goal Status: Unmet [name], COLLYER R ([id number]) [id number] Page 5 of 7 Goal Status: Unmet improving Ulcer/skin breakdown will have a volume reduction of 50% by week 8 Date Initiated: 08/08/2022 Target Resolution Date: 10/03/2022 Goal Status: Active Ulcer/skin breakdown will have a volume reduction of 80% by week 12 Date Initiated: 08/08/2022 Target Resolution Date: 10/31/2022 Goal Status: Active Ulcer/skin breakdown will heal within 14 weeks Date Initiated: 08/08/2022 Target Resolution Date: 11/14/2022 Goal Status: Active Interventions: Assess patient/caregiver ability to obtain necessary supplies Assess patient/caregiver ability to perform ulcer/skin care regimen upon admission and as needed Assess ulceration(s) every visit Provide education on ulcer and skin care Treatment Activities: Skin care regimen initiated : 08/08/2022 Notes: Electronic Signature(s) Signed: 09/05/2022 3:37:34 PM By: Angelina Pih Entered By: Angelina Pih on 09/05/2022 15:37:33 -------------------------------------------------------------------------------- Pain Assessment Details Patient Name: Date of Service: [name] RA R. 09/05/2022 3:00 PM Medical Record Number: [id number] Patient Account Number: [id number] Date of Birth/Sex: Treating RN: March 20, 1936 (87 y.o. [name] Primary Care Conswella Bruney: Clydie Braun Other Clinician: Referring Ikeya Brockel: Treating Jaxan Michel/Extender: Sydnee Cabal in Treatment: 4 Active Problems Location of Pain Severity and Description of Pain Patient Has Paino No Site Locations Rate the pain. Current Pain  Level: 0 Pain Management and Medication Current Pain Management: Electronic Signature(s) Signed: 09/05/2022 3:41:48 PM By: Angelina Pih Entered By: Angelina Pih on 09/05/2022 15:04:11 Patient/Caregiver Education Details -------------------------------------------------------------------------------- [name] ([id number]) [id number] Page 6 of 7 Patient Name: Date of Service: [name] 4/16/2024andnbsp3:00 PM Medical Record Number: [id number] Patient Account Number: [id number] Date of Birth/Gender: Treating RN: 1936-01-05 (87 y.o. [name] Primary Care Physician: Clydie Braun Other Clinician: Referring Physician: Treating Physician/Extender: Sydnee Cabal in Treatment: 4 Education Assessment Education Provided To: Patient Education Topics Provided Wound/Skin Impairment: Handouts: Caring for Your Ulcer Methods: Explain/Verbal Responses: State content correctly Electronic Signature(s) Signed: 09/05/2022 3:41:48 PM By: Angelina Pih Entered By: Angelina Pih on 09/05/2022 15:37:43 -------------------------------------------------------------------------------- Wound Assessment Details Patient Name: Date of Service: [name] RA R. 09/05/2022 3:00 PM Medical Record Number: [id number] Patient Account Number: [id number] Date of Birth/Sex: Treating RN: 03-05-1936 (87 y.o. [name] Primary Care Artis Buechele: Clydie Braun Other Clinician: Referring Barre Aydelott: Treating Luismiguel Lamere/Extender: Sydnee Cabal  in Treatment: 4 Wound Status Wound Number: 1 Primary Etiology: Skin Tear Wound Location: Right Lower Leg Wound Status: Open Wounding Event: Trauma Comorbid History: Anemia, Hypertension, Osteoarthritis Date Acquired: 07/24/2022 Weeks Of Treatment: 4 Clustered Wound: No Photos Wound Measurements Length: (cm) 3 Width: (cm) 2.2 Depth: (cm) 0.2 Area: (cm)  5.184 Volume: (cm) 1.037 % Reduction in Area: 26.6% % Reduction in Volume: -46.9% Epithelialization: None Tunneling: No Undermining: No Wound Description Classification: Full Thickness Without Exposed Support Structures Exudate Amount: Medium Exudate Type: Serosanguineous Exudate Color: red, brown Foul Odor After Cleansing: No Slough/Fibrino Yes Wound Bed [name], LANGBEHN R ([id number]) [id number] Page 7 of 7 Granulation Amount: Medium (34-66%) Exposed Structure Granulation Quality: Red, Pink Fat Layer (Subcutaneous Tissue) Exposed: Yes Necrotic Amount: Medium (34-66%) Necrotic Quality: Adherent Slough Treatment Notes Wound #1 (Lower Leg) Wound Laterality: Right Cleanser Soap and Water Discharge Instruction: Gently cleanse wound with antibacterial soap, rinse and pat dry prior to dressing wounds Vashe 5.8 (oz) Discharge Instruction: Use vashe 5.8 (oz) as directed Peri-Wound Care Topical Primary Dressing Prisma 4.34 (in) Discharge Instruction: Moisten w/normal saline or sterile water; Cover wound as directed. Do not remove from wound bed. Secondary Dressing (BORDER) Zetuvit Plus SILICONE BORDER Dressing 4x4 (in/in) Discharge Instruction: Please do not put silicone bordered dressings under wraps. Use non-bordered dressing only. Secured With Tubigrip Size D, 3x10 (in/yd) Discharge Instruction: single layer Compression Wrap Compression Stockings Add-Ons Electronic Signature(s) Signed: 09/05/2022 3:41:48 PM By: Angelina Pih Entered By: Angelina Pih on 09/05/2022 15:24:04 -------------------------------------------------------------------------------- Vitals Details Patient Name: Date of Service: [name], Linwood Dibbles RA R. 09/05/2022 3:00 PM Medical Record Number: [id number] Patient Account Number: [id number] Date of Birth/Sex: Treating RN: 1936-04-01 (87 y.o. [name] Primary Care Rosan Calbert: Clydie Braun Other Clinician: Referring  Dosia Yodice: Treating Kelly Ranieri/Extender: Sydnee Cabal in Treatment: 4 Vital Signs Time Taken: 15:03 Temperature (F): 97.9 Height (in): 69 Pulse (bpm): 74 Weight (lbs): 194 Respiratory Rate (breaths/min): 18 Body Mass Index (BMI): 28.6 Blood Pressure (mmHg): 132/75 Reference Range: 80 - 120 mg / dl Electronic Signature(s) Signed: 09/05/2022 3:41:48 PM By: Angelina Pih Entered By: Angelina Pih on 09/05/2022 15:04:00

## 2022-09-06 DIAGNOSIS — M79674 Pain in right toe(s): Secondary | ICD-10-CM | POA: Diagnosis not present

## 2022-09-06 DIAGNOSIS — M79675 Pain in left toe(s): Secondary | ICD-10-CM | POA: Diagnosis not present

## 2022-09-06 DIAGNOSIS — L03115 Cellulitis of right lower limb: Secondary | ICD-10-CM | POA: Diagnosis not present

## 2022-09-06 DIAGNOSIS — B351 Tinea unguium: Secondary | ICD-10-CM | POA: Diagnosis not present

## 2022-09-06 DIAGNOSIS — R600 Localized edema: Secondary | ICD-10-CM | POA: Diagnosis not present

## 2022-09-06 DIAGNOSIS — I10 Essential (primary) hypertension: Secondary | ICD-10-CM | POA: Diagnosis not present

## 2022-09-06 DIAGNOSIS — K2100 Gastro-esophageal reflux disease with esophagitis, without bleeding: Secondary | ICD-10-CM | POA: Diagnosis not present

## 2022-09-06 DIAGNOSIS — I6330 Cerebral infarction due to thrombosis of unspecified cerebral artery: Secondary | ICD-10-CM | POA: Diagnosis not present

## 2022-09-06 DIAGNOSIS — D649 Anemia, unspecified: Secondary | ICD-10-CM | POA: Diagnosis not present

## 2022-09-06 DIAGNOSIS — M7752 Other enthesopathy of left foot: Secondary | ICD-10-CM | POA: Diagnosis not present

## 2022-09-06 DIAGNOSIS — M810 Age-related osteoporosis without current pathological fracture: Secondary | ICD-10-CM | POA: Diagnosis not present

## 2022-09-06 DIAGNOSIS — L600 Ingrowing nail: Secondary | ICD-10-CM | POA: Diagnosis not present

## 2022-09-06 DIAGNOSIS — Z0000 Encounter for general adult medical examination without abnormal findings: Secondary | ICD-10-CM | POA: Diagnosis not present

## 2022-09-06 DIAGNOSIS — E7801 Familial hypercholesterolemia: Secondary | ICD-10-CM | POA: Diagnosis not present

## 2022-09-11 ENCOUNTER — Ambulatory Visit: Payer: HMO | Attending: Orthopedic Surgery | Admitting: Occupational Therapy

## 2022-09-11 DIAGNOSIS — R208 Other disturbances of skin sensation: Secondary | ICD-10-CM | POA: Diagnosis not present

## 2022-09-11 DIAGNOSIS — M6281 Muscle weakness (generalized): Secondary | ICD-10-CM | POA: Insufficient documentation

## 2022-09-11 DIAGNOSIS — R600 Localized edema: Secondary | ICD-10-CM | POA: Insufficient documentation

## 2022-09-11 DIAGNOSIS — R278 Other lack of coordination: Secondary | ICD-10-CM | POA: Insufficient documentation

## 2022-09-12 ENCOUNTER — Encounter: Payer: HMO | Admitting: Physician Assistant

## 2022-09-12 DIAGNOSIS — S81811A Laceration without foreign body, right lower leg, initial encounter: Secondary | ICD-10-CM | POA: Diagnosis not present

## 2022-09-12 NOTE — Progress Notes (Signed)
NAHIMA, ALES R ([id number]) [id number] Page 1 of 6 Visit Report for 09/12/2022 Chief Complaint Document Details Patient Name: Date of Service: [name] 09/12/2022 12:00 PM Medical Record Number: [id number] Patient Account Number: [id number] Date of Birth/Sex: Treating RN: Oct 08, 1935 (87 y.o. [name] Primary Care Provider: Clydie Braun Other Clinician: Referring Provider: Treating Provider/Extender: Sydnee Cabal in Treatment: 5 Information Obtained from: Patient Chief Complaint Skin tear right leg 07/24/22 Electronic Signature(s) Signed: 09/12/2022 12:35:11 PM By: Allen Derry PA-C Entered By: Allen Derry on 09/12/2022 12:35:11 -------------------------------------------------------------------------------- Debridement Details Patient Name: Date of Service: [name] 09/12/2022 12:00 PM Medical Record Number: [id number] Patient Account Number: [id number] Date of Birth/Sex: Treating RN: [birth year] (87 y.o. [name] Primary Care Provider: Clydie Braun Other Clinician: Referring Provider: Treating Provider/Extender: Sydnee Cabal in Treatment: 5 Debridement Performed for Assessment: Wound #1 Right Lower Leg Performed By: Physician Allen Derry, PA-C Debridement Type: Debridement Level of Consciousness (Pre-procedure): Awake and Alert Pre-procedure Verification/Time Out Yes - 12:37 Taken: Percent of Wound Bed Debrided: 100% T Area Debrided (cm): otal 6.48 Tissue and other material debrided: Viable, Non-Viable, Slough, Subcutaneous, Slough Level: Skin/Subcutaneous Tissue Debridement Description: Excisional Instrument: Curette Bleeding: Minimum Hemostasis Achieved: Pressure Response to Treatment: Procedure was tolerated well Level of Consciousness (Post- Awake and Alert procedure): Post Debridement Measurements of Total Wound Length: (cm) 3.3 Width:  (cm) 2.5 Depth: (cm) 0.2 Volume: (cm) 1.296 Character of Wound/Ulcer Post Debridement: Stable Post Procedure Diagnosis Same as Pre-procedure Electronic Signature(s) Signed: 09/12/2022 4:43:31 PM By: Angelina Pih Signed: 09/12/2022 4:59:06 PM By: Allen Derry PA-C Entered By: Angelina Pih on 09/12/2022 12:38:18 HPI Details -------------------------------------------------------------------------------- [name] ([id number]) [id number] Page 2 of 6 Patient Name: Date of Service: [name] 09/12/2022 12:00 PM Medical Record Number: [id number] Patient Account Number: [id number] Date of Birth/Sex: Treating RN: Mar 26, 1936 (87 y.o. [name] Primary Care Provider: Clydie Braun Other Clinician: Referring Provider: Treating Provider/Extender: Sydnee Cabal in Treatment: 5 History of Present Illness HPI Description: 08-08-2022 upon evaluation today patient appears for initial inspection here in our clinic concerning issues that she has been having with a wound on her right anterior lower leg where she dropped a shredder on her leg causing a skin tear. Unfortunately this was attempted to be reapproximated but that has not been extremely effective for her. Fortunately there does not appear to be any signs of infection systemically which is great news. With that being said there is local infection definitely noted at this point. She has been on antibiotics. Currently cefdinir and doxycycline where the 2 antibiotics that were used up to this point. This original injury occurred on March 4. Her culture showed Enterococcus faecium and that was on 08-06-2022 that was resulted. Her primary care provider Dr. Sampson Goon who is also an infectious disease specialist that stated that if she was not better at the end of her current cefdinir and doxycycline regimen that he would likely put her on Augmentin or amoxicillin. I  think we may want to switch her to Augmentin at this point. Patient has a history of hypertension and peripheral vascular disease but really no other major medical problems at this point. With that being said although she did not show any signs of infection at this point I feel like this is something that might heal readily. However because of the screen showing PAD here in the clinic on the ABIs if she does  not show signs of improvement over the next that I think we may want to go ahead and see about getting her to vascular. Further evaluation and treatment. Will however see how things do over the next week. 08-15-2022 upon evaluation today patient appears to be doing well currently in regard to her wound. She has been tolerating the dressing changes and overall seems to be doing quite well with regard to her wound I am seeing some definite improvement here. With that being said I do believe that she is making good progress with regard to the Iodoflex which I think is cleaning up the surface of the wound quite well I am pleased in that regard. She has started on the Augmentin currently and seems to be doing very well with that. 08-22-2022 upon evaluation today patient appears to be doing well currently in regard to her wound. She is actually tolerating the dressing changes without complication. Fortunately there does not appear to be any signs of active infection at this time which is great news and overall I do believe that she is significantly improved with the Augmentin which is excellent as well. 08-29-2022 upon evaluation today patient appears to be doing well currently in regard to her wound which is actually making some pretty good progress here. I am extremely pleased with where we stand I think she is headed in the right direction and in general I do believe that we are making leaps and bounds towards getting this completely closed. With that being said I do think it is time to go ahead and switch  over to collagen in place of the Iodoflex I think you will feel more comfortable and I think it will help with additional tissue growth much more effectively at this point. 09-05-2022 upon evaluation today patient appears to be doing well currently in regard to her wound which is actually showing signs of excellent improvement. Fortunately I do not see any evidence of active infection locally nor systemically at this time which is great news. 09-12-2022 upon evaluation today patient appears to be doing well currently in regard to her wound. She has been tolerating the dressing changes without complication and very pleased in that regard. Fortunately there does not appear to be any signs of active infection locally nor systemically which is great news. No fevers, chills, nausea, vomiting, or diarrhea. Electronic Signature(s) Signed: 09/12/2022 12:40:19 PM By: Allen Derry PA-C Entered By: Allen Derry on 09/12/2022 12:40:18 -------------------------------------------------------------------------------- Physical Exam Details Patient Name: Date of Service: Lavell Anchors RA R. 09/12/2022 12:00 PM Medical Record Number: [id number] Patient Account Number: [id number] Date of Birth/Sex: Treating RN: 01/20/1936 (87 y.o. [name] Primary Care Provider: Clydie Braun Other Clinician: Referring Provider: Treating Provider/Extender: Sydnee Cabal in Treatment: 5 Constitutional Well-nourished and well-hydrated in no acute distress. Respiratory normal breathing without difficulty. Psychiatric this patient is able to make decisions and demonstrates good insight into disease process. Alert and Oriented x 3. pleasant and cooperative. Notes Upon inspection patient's wound bed showed signs of good granulation epithelization at this point. Fortunately there does not appear to be any evidence of active infection locally or systemically which is excellent and overall I believe  that we are moving in the right direction. Electronic Signature(s) Signed: 09/12/2022 12:40:41 PM By: Allen Derry PA-C Entered By: Allen Derry on 09/12/2022 12:40:40 Velvia Boys R ([id number]) [id number] Page 3 of 6 -------------------------------------------------------------------------------- Physician Orders Details Patient Name: Date of Service: [name] 09/12/2022 12:00  PM Medical Record Number: [id number] Patient Account Number: [id number] Date of Birth/Sex: Treating RN: 18-Feb-1936 (87 y.o. [name] Primary Care Provider: Clydie Braun Other Clinician: Referring Provider: Treating Provider/Extender: Sydnee Cabal in Treatment: 5 Verbal / Phone Orders: No Diagnosis Coding ICD-10 Coding Code Description [phone] Laceration without foreign body, right lower leg, initial encounter L97.812 Non-pressure chronic ulcer of other part of right lower leg with fat layer exposed I73.89 Other specified peripheral vascular diseases I10 Essential (primary) hypertension Follow-up Appointments Return Appointment in 1 week. Bathing/ Shower/ Hygiene May shower; gently cleanse wound with antibacterial soap, rinse and pat dry prior to dressing wounds No tub bath. Anesthetic (Use 'Patient Medications' Section for Anesthetic Order Entry) Wound #1 Right Lower Leg Lidocaine applied to wound bed Edema Control - Lymphedema / Segmental Compressive Device / Other Tubigrip single layer applied. - size D Elevate, Exercise Daily and A void Standing for Long Periods of Time. Elevate legs to the level of the heart and pump ankles as often as possible Elevate leg(s) parallel to the floor when sitting. DO YOUR BEST to sleep in the bed at night. DO NOT sleep in your recliner. Long hours of sitting in a recliner leads to swelling of the legs and/or potential wounds on your backside. Medications-Please add to medication  list. ntibiotics - Continue antibiotics as prescribed P.O. A Wound Treatment Wound #1 - Lower Leg Wound Laterality: Right Cleanser: Soap and Water 3 x Per Week/30 Days Discharge Instructions: Gently cleanse wound with antibacterial soap, rinse and pat dry prior to dressing wounds Cleanser: Vashe 5.8 (oz) 3 x Per Week/30 Days Discharge Instructions: Use vashe 5.8 (oz) as directed Prim Dressing: Prisma 4.34 (in) (Generic) 3 x Per Week/30 Days ary Discharge Instructions: Moisten w/normal saline or sterile water; Cover wound as directed. Do not remove from wound bed. Secondary Dressing: (BORDER) Zetuvit Plus SILICONE BORDER Dressing 4x4 (in/in) (Generic) 3 x Per Week/30 Days Discharge Instructions: Please do not put silicone bordered dressings under wraps. Use non-bordered dressing only. Secured With: Tubigrip Size D, 3x10 (in/yd) 3 x Per Week/30 Days Discharge Instructions: single layer Electronic Signature(s) Signed: 09/12/2022 4:43:31 PM By: Angelina Pih Signed: 09/12/2022 4:59:06 PM By: Allen Derry PA-C Entered By: Angelina Pih on 09/12/2022 12:47:12 -------------------------------------------------------------------------------- Problem List Details Patient Name: Date of Service: Lavell Anchors RA R. 09/12/2022 12:00 PM Medical Record Number: [id number] Patient Account Number: [id number] Date of Birth/Sex: Treating RN: 1935-12-02 (87 y.o. [name] Primary Care Provider: Clydie Braun Other Clinician: GRIZEL, VESELY ([id number]) [id number] Page 4 of 6 Referring Provider: Treating Provider/Extender: Sydnee Cabal in Treatment: 5 Active Problems ICD-10 Encounter Code Description Active Date MDM Diagnosis S81.811A Laceration without foreign body, right lower leg, initial encounter 08/08/2022 No Yes L97.812 Non-pressure chronic ulcer of other part of right lower leg with fat layer 08/08/2022 No Yes exposed I73.89  Other specified peripheral vascular diseases 08/08/2022 No Yes I10 Essential (primary) hypertension 08/08/2022 No Yes Inactive Problems Resolved Problems Electronic Signature(s) Signed: 09/12/2022 12:34:39 PM By: Allen Derry PA-C Entered By: Allen Derry on 09/12/2022 12:34:39 -------------------------------------------------------------------------------- Progress Note Details Patient Name: Date of Service: [name] 09/12/2022 12:00 PM Medical Record Number: [id number] Patient Account Number: [id number] Date of Birth/Sex: Treating RN: Aug 23, 1935 (87 y.o. [name] Primary Care Provider: Clydie Braun Other Clinician: Referring Provider: Treating Provider/Extender: Sydnee Cabal in Treatment: 5 Subjective Chief Complaint Information obtained from Patient Skin tear right leg 07/24/22 History of Present  Illness (HPI) 08-08-2022 upon evaluation today patient appears for initial inspection here in our clinic concerning issues that she has been having with a wound on her right anterior lower leg where she dropped a shredder on her leg causing a skin tear. Unfortunately this was attempted to be reapproximated but that has not been extremely effective for her. Fortunately there does not appear to be any signs of infection systemically which is great news. With that being said there is local infection definitely noted at this point. She has been on antibiotics. Currently cefdinir and doxycycline where the 2 antibiotics that were used up to this point. This original injury occurred on March 4. Her culture showed Enterococcus faecium and that was on 08-06-2022 that was resulted. Her primary care provider Dr. Sampson Goon who is also an infectious disease specialist that stated that if she was not better at the end of her current cefdinir and doxycycline regimen that he would likely put her on Augmentin or amoxicillin. I think we may want to switch her to  Augmentin at this point. Patient has a history of hypertension and peripheral vascular disease but really no other major medical problems at this point. With that being said although she did not show any signs of infection at this point I feel like this is something that might heal readily. However because of the screen showing PAD here in the clinic on the ABIs if she does not show signs of improvement over the next that I think we may want to go ahead and see about getting her to vascular. Further evaluation and treatment. Will however see how things do over the next week. 08-15-2022 upon evaluation today patient appears to be doing well currently in regard to her wound. She has been tolerating the dressing changes and overall seems to be doing quite well with regard to her wound I am seeing some definite improvement here. With that being said I do believe that she is making good progress with regard to the Iodoflex which I think is cleaning up the surface of the wound quite well I am pleased in that regard. She has started on the Augmentin currently and seems to be doing very well with that. 08-22-2022 upon evaluation today patient appears to be doing well currently in regard to her wound. She is actually tolerating the dressing changes without complication. Fortunately there does not appear to be any signs of active infection at this time which is great news and overall I do believe that she is significantly improved with the Augmentin which is excellent as well. 08-29-2022 upon evaluation today patient appears to be doing well currently in regard to her wound which is actually making some pretty good progress here. I am extremely pleased with where we stand I think she is headed in the right direction and in general I do believe that we are making leaps and bounds towards getting this completely closed. With that being said I do think it is time to go ahead and switch over to collagen in place of the  Iodoflex I think you will feel SHENELLE, KLAS R ([id number]) [id number] Page 5 of 6 more comfortable and I think it will help with additional tissue growth much more effectively at this point. 09-05-2022 upon evaluation today patient appears to be doing well currently in regard to her wound which is actually showing signs of excellent improvement. Fortunately I do not see any evidence of active infection locally nor systemically at this time which is  great news. 09-12-2022 upon evaluation today patient appears to be doing well currently in regard to her wound. She has been tolerating the dressing changes without complication and very pleased in that regard. Fortunately there does not appear to be any signs of active infection locally nor systemically which is great news. No fevers, chills, nausea, vomiting, or diarrhea. Objective Constitutional Well-nourished and well-hydrated in no acute distress. Vitals Time Taken: 12:11 PM, Height: 69 in, Weight: 194 lbs, BMI: 28.6, Temperature: 98.4 F, Pulse: 68 bpm, Respiratory Rate: 18 breaths/min, Blood Pressure: 164/75 mmHg. Respiratory normal breathing without difficulty. Psychiatric this patient is able to make decisions and demonstrates good insight into disease process. Alert and Oriented x 3. pleasant and cooperative. General Notes: Upon inspection patient's wound bed showed signs of good granulation epithelization at this point. Fortunately there does not appear to be any evidence of active infection locally or systemically which is excellent and overall I believe that we are moving in the right direction. Integumentary (Hair, Skin) Wound #1 status is Open. Original cause of wound was Trauma. The date acquired was: 07/24/2022. The wound has been in treatment 5 weeks. The wound is located on the Right Lower Leg. The wound measures 3.3cm length x 2.5cm width x 0.2cm depth; 6.48cm^2 area and 1.296cm^3 volume. There is Fat  Layer (Subcutaneous Tissue) exposed. There is no tunneling or undermining noted. There is a medium amount of serosanguineous drainage noted. There is medium (34-66%) red, pink granulation within the wound bed. There is a medium (34-66%) amount of necrotic tissue within the wound bed including Adherent Slough. Assessment Active Problems ICD-10 Laceration without foreign body, right lower leg, initial encounter Non-pressure chronic ulcer of other part of right lower leg with fat layer exposed Other specified peripheral vascular diseases Essential (primary) hypertension Procedures Wound #1 Pre-procedure diagnosis of Wound #1 is a Skin T located on the Right Lower Leg . There was a Excisional Skin/Subcutaneous Tissue Debridement with a ear total area of 6.48 sq cm performed by Allen Derry, PA-C. With the following instrument(s): Curette to remove Viable and Non-Viable tissue/material. Material removed includes Subcutaneous Tissue and Slough and. No specimens were taken. A time out was conducted at 12:37, prior to the start of the procedure. A Minimum amount of bleeding was controlled with Pressure. The procedure was tolerated well. Post Debridement Measurements: 3.3cm length x 2.5cm width x 0.2cm depth; 1.296cm^3 volume. Character of Wound/Ulcer Post Debridement is stable. Post procedure Diagnosis Wound #1: Same as Pre-Procedure Plan 1. I am and I suggest that we continue with the silver collagen which is still doing excellent. 2. I am also can recommend the patient should continue to elevate her legs much as possible she is using a Tubigrip she is currently not showing signs of being too swollen I think that there is no need for an actual compression wrap at this point we will continue to keep a close eye on this however going forward if anything changes that we have that is a plan if need be. We will see patient back for reevaluation in 1 week here in the clinic. If anything worsens or changes  patient will contact our office for additional recommendations. ASHARIA, LOTTER R ([id number]) [id number] Page 6 of 6 Electronic Signature(s) Signed: 09/12/2022 12:41:06 PM By: Allen Derry PA-C Entered By: Allen Derry on 09/12/2022 12:41:06 -------------------------------------------------------------------------------- SuperBill Details Patient Name: Date of Service: [name] 09/12/2022 Medical Record Number: [id number] Patient Account Number: [id number] Date of Birth/Sex: Treating RN: [birth year] (  87 y.o. [name] Primary Care Provider: Clydie Braun Other Clinician: Referring Provider: Treating Provider/Extender: Sydnee Cabal in Treatment: 5 Diagnosis Coding ICD-10 Codes Code Description [phone] Laceration without foreign body, right lower leg, initial encounter L97.812 Non-pressure chronic ulcer of other part of right lower leg with fat layer exposed I73.89 Other specified peripheral vascular diseases I10 Essential (primary) hypertension Facility Procedures : CPT4 Code: [id number] Description: 11042 - DEB SUBQ TISSUE 20 SQ CM/< ICD-10 Diagnosis Description L97.812 Non-pressure chronic ulcer of other part of right lower leg with fat layer exp Modifier: osed Quantity: 1 Physician Procedures : CPT4 Code Description Modifier [id number] 11042 - WC PHYS SUBQ TISS 20 SQ CM ICD-10 Diagnosis Description L97.812 Non-pressure chronic ulcer of other part of right lower leg with fat layer exposed Quantity: 1 Electronic Signature(s) Signed: 09/12/2022 4:43:31 PM By: Angelina Pih Signed: 09/12/2022 4:59:06 PM By: Allen Derry PA-C Previous Signature: 09/12/2022 12:41:13 PM Version By: Allen Derry PA-C Entered By: Angelina Pih on 09/12/2022 12:47:25

## 2022-09-13 ENCOUNTER — Ambulatory Visit: Payer: HMO | Admitting: Occupational Therapy

## 2022-09-13 DIAGNOSIS — R600 Localized edema: Secondary | ICD-10-CM

## 2022-09-13 DIAGNOSIS — M6281 Muscle weakness (generalized): Secondary | ICD-10-CM | POA: Diagnosis not present

## 2022-09-13 DIAGNOSIS — R208 Other disturbances of skin sensation: Secondary | ICD-10-CM

## 2022-09-13 DIAGNOSIS — R278 Other lack of coordination: Secondary | ICD-10-CM

## 2022-09-13 NOTE — Therapy (Signed)
[facility] [address], [place], [address] Phone: [phone]   Fax:  [phone]  Occupational Therapy Evaluation  Patient Details  Name: [name] MRN: [id number] Date of Birth: 1935-05-28 No data recorded  Encounter Date: 09/11/2022   OT End of Session - 09/13/22 1540     Visit Number 1    Number of Visits 13    Date for OT Re-Evaluation 10/25/22    OT Start Time 0951    OT Stop Time 1027    OT Time Calculation (min) 36 min    Activity Tolerance Patient tolerated treatment well    Behavior During Therapy [facility] for tasks assessed/performed             Past Medical History:  Diagnosis Date   Arthritis    GERD (gastroesophageal reflux disease)    Hx of basal cell carcinoma 01/13/2013   left parasternal chest   Hx of basal cell carcinoma 04/26/2015   right sacral area   Hx of basal cell carcinoma 04/26/2016   left temple   Hypertension     Past Surgical History:  Procedure Laterality Date   ABDOMINAL HYSTERECTOMY     APPENDECTOMY     BACK SURGERY     COLONOSCOPY WITH PROPOFOL N/A 03/07/2017   Procedure: COLONOSCOPY WITH PROPOFOL;  Surgeon: Toledo, Boykin Nearing, MD;  Location: [facility] ENDOSCOPY;  Service: Gastroenterology;  Laterality: N/A;   ESOPHAGOGASTRODUODENOSCOPY N/A 03/07/2017   Procedure: ESOPHAGOGASTRODUODENOSCOPY (EGD);  Surgeon: Toledo, Boykin Nearing, MD;  Location: [facility] ENDOSCOPY;  Service: Gastroenterology;  Laterality: N/A;   FOOT SURGERY Left 05/2016   JOINT REPLACEMENT Bilateral 2010 2011   REPLACEMENT TOTAL KNEE BILATERAL  2010    There were no vitals filed for this visit.   Subjective Assessment - 09/13/22 1612     Subjective  Pt reports she has seen some progress with her left wrist and fingers over time but she is frustrated with the slow progress and wants to work towards improving her hand function.    Pertinent History Per chart, recent MD note:  [name] returns today  for re-evaluation of her left upper extremity. She is a 87 y.o. female who I last saw in 07/04/2022. Patient is status post left rTSA with a block with [name] on 09/23/2021. She states that in PACU she noticed numbness in the radial nerve distribution and was unable to extend her wrist. Since that time she has regained wrist extension. She has not noted improvement in the numbness in the radial nerve distribution. She has had multiple EMG studies which have shown electrodiagnostic evidence of recovery of the radial nerve proper. She returns today for reevaluation. She notes that she has had some new onset swelling at the dorsal aspect of her left hand. She is not sure why this happened. She has not seen any improvement in terms of her finger or thumb function. Examination of the left upper extremity shows she has normal sensation in ulnar and median nerve distributions. She has diminished sensation and pressure perception to light touch in left radial nerve distribution. Skin is intact. Fingers are warm and well perfused with brisk capillary refill and she has a palpable radial pulse. There is no tenderness to palpation throughout. 5/5 triceps. 5/5 brachioradialis. 5/5 left wrist extension through her radial wrist extenders, 0/5 EDC, 0/5 EPL. 0/5 EIP    Patient Stated Goals Pt would like to improve movement and strength of her left hand to  complete daily tasks with greater ease and independence.    Currently in Pain? No/denies    Multiple Pain Sites No               [facility] OT Assessment - 09/13/22 1543       Assessment   Medical Diagnosis left radial nerve palsy    Onset Date/Surgical Date 09/23/21    Hand Dominance Right    Prior Therapy yes, at [facility] but has not had therapy in the last 3 months.      Balance Screen   Has the patient fallen in the past 6 months No      ADL   ADL comments Pt reports modified independence with basic self care tasks, she is now able to tie her shoe.   She has difficulty with using a manual can opener, picking up objects, cracking an egg.  Difficulty with higher level IADL homemaking tasks.      Written Expression   Dominant Hand Right      Cognition   Overall Cognitive Status Within Functional Limits for tasks assessed      Sensation   Light Touch Impaired Detail   decreased sensation around left thumb with some numbness and mild tingling.  Reports index feels normal.   Hot/Cold Appears Intact      Edema   Edema Pt reports increased edema in left hand, dorsum of the hand.      AROM   Right Wrist Extension 60 Degrees    Right Wrist Flexion 70 Degrees    Left Wrist Extension 60 Degrees    Left Wrist Flexion 65 Degrees      Strength   Right Hand Grip (lbs) 40    Right Hand Lateral Pinch 7 lbs    Right Hand 3 Point Pinch 5 lbs    Left Hand Grip (lbs) 24    Left Hand Lateral Pinch 6 lbs    Left Hand 3 Point Pinch 4 lbs      Left Hand AROM   L Thumb Radial ADduction/ABduction 0-55 25    L Thumb Palmar ADduction/ABduction 0-45 30    L Thumb Opposition to Index --   full opposition   L Index  MCP 0-90 90 Degrees    L Index PIP 0-100 90 Degrees   -20 extension   L Index DIP 0-70 --   0 degrees extension   L Long  MCP 0-90 90 Degrees    L Long PIP 0-100 90 Degrees   -30 extension   L Long DIP 0-70 --   -25 extension   L Ring  MCP 0-90 90 Degrees    L Ring PIP 0-100 90 Degrees   -15 extension   L Ring DIP 0-70 --   -5 extension   L Little  MCP 0-90 90 Degrees    L Little PIP 0-100 90 Degrees   -15 extension   L Little DIP 0-70 --   -10 extension           Pt verbally instructed on use of contrast to decrease edema in left hand and wrist , to perform at home as needed.   Pt to bring in splint next session for assessment    OT Education - 09/13/22 1542     Education Details role of OT, goals, POC    Person(s) Educated Patient    Methods Explanation    Comprehension Verbalized understanding  OT  Short Term Goals - 09/13/22 1620       OT SHORT TERM GOAL #1   Title Pt will be independent with HEP for left wrist and hand    Baseline Eval:  continues to perform exercises from prior OT tx, will need updated HEP.    Time 3    Period Weeks    Status New    Target Date 10/02/22               OT Long Term Goals - 09/13/22 1620       OT LONG TERM GOAL #1   Title Pt will demonstrate improve MP extension by 10 degrees to facilitate greater active reach for objects    Baseline Eval:  Limited MCP extension    Time 6    Period Weeks    Status New    Target Date 10/25/22      OT LONG TERM GOAL #2   Title Pt will demonstrate improvement of grip by 10# to grasp items without dropping.    Baseline Eval:  left grip limited to 24#, difficulty maintaining grip on objects    Time 6    Period Weeks    Target Date 10/25/22      OT LONG TERM GOAL #3   Title Pt will demonstrate improved functional  strength in wrist and hand to operate manual can opener without difficulty.    Baseline Eval:  difficulty with using manual can opener    Time 6    Period Weeks    Status New    Target Date 10/25/22      OT LONG TERM GOAL #4   Title Pt will demonstrate improved hand function and coordination to engage in meal preparation tasks with modified independence.    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  She reports afterward she had numbness along the left radial nerve distribution and decreased hand and wrist motion.  She was seen for Occupational Therapy in [facility] for a limited number of visits and has not had any  OT in the last 3 months.  She reports recent increase in edema in the left dorsum of her hand and continued limited motion.  She had a recent EMG study which indicated healing over time.  She was referred to OT for evaluation and treatment. She presents with limited active motion in her left hand especially for finger extension, decreased strength overall but also decreased grip and pinch strengths with comparison.  She acknowledges improvement since onset but feels her progress is slow and wants to see improvements in her left hand function to perform daily tasks with greater ease and independence.  Pt would benefit from skilled OT services to maximize her safety and independence with ADL and IADL tasks at home and in the community.    OT Occupational Profile and History Detailed Assessment- Review of Records and additional review of physical, cognitive, psychosocial history related  to current functional performance    Occupational performance deficits (Please refer to evaluation for details): ADL's;IADL's;Leisure    Body Structure / Function / Physical Skills ADL;ROM;UE functional use;FMC;Dexterity;Sensation;Edema;Strength;Coordination;IADL    Psychosocial Skills Habits;Routines and Behaviors;Environmental  Adaptations    Rehab Potential Good    Clinical Decision Making Several treatment options, min-mod task modification necessary    Comorbidities Affecting Occupational Performance: May have comorbidities impacting occupational performance    Modification or Assistance to Complete Evaluation  No modification of tasks or assist necessary to complete eval    OT Frequency 2x / week    OT Duration 6 weeks    OT Treatment/Interventions Self-care/ADL training;Electrical Stimulation;Therapeutic exercise;Coping strategies training;Moist Heat;Paraffin;Neuromuscular education;Splinting;Patient/family education;Fluidtherapy;Therapeutic activities;Cryotherapy;Ultrasound;Contrast Bath;DME and/or AE  instruction;Manual Therapy;Passive range of motion    Consulted and Agree with Plan of Care Patient             Patient will benefit from skilled therapeutic intervention in order to improve the following deficits and impairments:   Body Structure / Function / Physical Skills: ADL, ROM, UE functional use, FMC, Dexterity, Sensation, Edema, Strength, Coordination, IADL   Psychosocial Skills: Habits, Routines and Behaviors, Environmental  Adaptations   Visit Diagnosis: Muscle weakness (generalized)  Other lack of coordination  Other disturbances of skin sensation  Localized edema    Problem List Patient Active Problem List   Diagnosis Date Noted   CVA (cerebral infarction) 11/28/2015    Rontavious Albright, OT 09/13/2022, 4:26 PM  [facility] [address], [place], [address] Phone: [phone]   Fax:  [phone]  Name: [name] MRN: [id number] Date of Birth: Feb 25, 1936

## 2022-09-13 NOTE — Progress Notes (Signed)
[name] ([id number]) [id number] Page 1 of 7 Visit Report for 09/12/2022 Arrival Information Details Patient Name: Date of Service: [name] [name]. 09/12/2022 12:00 PM Medical Record Number: [id number] Patient Account Number: [id number] Date of Birth/Sex: Treating RN: [birth year] (87 y.o. [name] Primary Care Threasa Kinch: Clydie Braun Other Clinician: Referring Heyden Jaber: Treating Damiano Stamper/Extender: Sydnee Cabal in Treatment: 5 Visit Information History Since Last Visit Added or deleted any medications: No Patient Arrived: Ambulatory Any new allergies or adverse reactions: No Arrival Time: 12:09 Had a fall or experienced change in No Accompanied By: self activities of daily living that may affect Transfer Assistance: None risk of falls: Patient Identification Verified: Yes Hospitalized since last visit: No Secondary Verification Process Completed: Yes Has Dressing in Place as Prescribed: Yes Patient Has Alerts: Yes Pain Present Now: No Patient Alerts: Patient on Blood Thinner 08/08/22 ABI Right PAD 08/08/22 ABI Left PAD Electronic Signature(s) Signed: 09/12/2022 4:43:31 PM By: Angelina Pih Entered By: Angelina Pih on 09/12/2022 12:11:03 -------------------------------------------------------------------------------- Clinic Level of Care Assessment Details Patient Name: Date of Service: [name] 09/12/2022 12:00 PM Medical Record Number: [id number] Patient Account Number: [id number] Date of Birth/Sex: Treating RN: 09-07-1935 (87 y.o. [name] Primary Care Regene Mccarthy: Clydie Braun Other Clinician: Referring Mykira Hofmeister: Treating Gunda Maqueda/Extender: Sydnee Cabal in Treatment: 5 Clinic Level of Care Assessment Items TOOL 1 Quantity Score  - 0 Use ANAJA, MONTSd Procedure is performed on INITIAL visit ASSESSMENTS - Nursing Assessment / Reassessment   - 0 General Physical Exam (combine w/ comprehensive assessment (listed just below) when performed on new pt. evals)  - 0 Comprehensive Assessment (HX, ROS, Risk Assessments, Wounds Hx, etc.) ASSESSMENTS - Wound and Skin Assessment / Reassessment  - 0 Dermatologic / Skin Assessment (not related to wound area) ASSESSMENTS - Ostomy and/or Continence Assessment and Care  - 0 Incontinence Assessment and Management  - 0 Ostomy Care Assessment and Management (repouching, etc.) PROCESS - Coordination of Care  - 0 Simple Patient / Family Education for ongoing care  - 0 Complex (extensive) Patient / Family Education for ongoing care  - 0 Staff obtains Chiropractor, Records, T Results / Process Orders est  - 0 Staff telephones HHA, Nursing Homes / Clarify orders / etc  - 0 Routine Transfer to another Facility (non-emergent condition)  - 0 Routine Hospital Admission (non-emergent condition)  - 0 New Admissions / Insurance Authorizations / Ordering NPWT Apligraf, etc. ,  - 0 Emergency Hospital Admission (emergent condition) PROCESS - [address] ALEASE, FAIT [name] ([id number]) [id number] Page 2 of 7  - 0 Pediatric / Minor Patient Management  - 0 Isolation Patient Management  - 0 Hearing / Language / Visual special needs  - 0 Assessment of Community assistance (transportation, D/C planning, etc.)  - 0 Additional assistance / Altered mentation  - 0 Support Surface(s) Assessment (bed, cushion, seat, etc.) INTERVENTIONS - Miscellaneous  - 0 External ear exam  - 0 Patient Transfer (multiple staff / Nurse, adult / Similar devices)  - 0 Simple Staple / Suture removal (25 or less)  - 0 Complex Staple / Suture removal (26 or more)  - 0 Hypo/Hyperglycemic Management (do not check if billed separately)  - 0 Ankle / Brachial Index (ABI) - do not check if billed separately Has the patient been seen at the hospital  within the last three years: Yes Total Score: 0 Level Of Care: ____ Electronic Signature(s) Signed: 09/12/2022 4:43:31 PM By: Angelina Pih  Entered By: Angelina Pih on 09/12/2022 12:47:18 -------------------------------------------------------------------------------- Encounter Discharge Information Details Patient Name: Date of Service: SA [name] 09/12/2022 12:00 PM Medical Record Number: [id number] Patient Account Number: [id number] Date of Birth/Sex: Treating RN: 18-Apr-1936 (87 y.o. [name] Primary Care Arpan Eskelson: Clydie Braun Other Clinician: Referring Treyshawn Muldrew: Treating Haili Donofrio/Extender: Sydnee Cabal in Treatment: 5 Encounter Discharge Information Items Post Procedure Vitals Discharge Condition: Stable Temperature (F): 98.4 Ambulatory Status: Ambulatory Pulse (bpm): 68 Discharge Destination: Home Respiratory Rate (breaths/min): 18 Transportation: Private Auto Blood Pressure (mmHg): 164/75 Accompanied By: self Schedule Follow-up Appointment: Yes Clinical Summary of Care: Electronic Signature(s) Signed: 09/12/2022 4:43:31 PM By: Angelina Pih Entered By: Angelina Pih on 09/12/2022 12:48:38 -------------------------------------------------------------------------------- Lower Extremity Assessment Details Patient Name: Date of Service: [name] [name]. 09/12/2022 12:00 PM Medical Record Number: [id number] Patient Account Number: [id number] Date of Birth/Sex: Treating RN: 09-23-1935 (87 y.o. [name] Primary Care Maryln Eastham: Clydie Braun Other Clinician: Referring Bonnee Zertuche: Treating Vada Swift/Extender: Sydnee Cabal in Treatment: 5 Edema Assessment Assessed: [Left: No] [Right: No] Edema: [Left: Ye] [Right: s] Calf JEROLYN, FLENNIKEN [name] ([id number]) [id number] Page 3 of 7 Left: Right: Point of Measurement: 34 cm From Medial Instep 40 cm Ankle Left:  Right: Point of Measurement: 11 cm From Medial Instep 26 cm Vascular Assessment Pulses: Dorsalis Pedis Palpable: [Right:Yes] Posterior Tibial Palpable: [Right:Yes] Electronic Signature(s) Signed: 09/12/2022 4:43:31 PM By: Angelina Pih Entered By: Angelina Pih on 09/12/2022 12:17:47 -------------------------------------------------------------------------------- Multi Wound Chart Details Patient Name: Date of Service: Lisette Grinder, Linwood Dibbles [name]. 09/12/2022 12:00 PM Medical Record Number: [id number] Patient Account Number: [id number] Date of Birth/Sex: Treating RN: 1936-01-17 (87 y.o. [name] Primary Care Faythe Heitzenrater: Clydie Braun Other Clinician: Referring Otis Burress: Treating Angellica Maddison/Extender: Sydnee Cabal in Treatment: 5 Vital Signs Height(in): 69 Pulse(bpm): 68 Weight(lbs): 194 Blood Pressure(mmHg): 164/75 Body Mass Index(BMI): 28.6 Temperature(F): 98.4 Respiratory Rate(breaths/min): 18 [1:Photos:] [N/A:N/A] Right Lower Leg N/A N/A Wound Location: Trauma N/A N/A Wounding Event: Skin Tear N/A N/A Primary Etiology: Anemia, Hypertension, Osteoarthritis N/A N/A Comorbid History: 07/24/2022 N/A N/A Date Acquired: 5 N/A N/A Weeks of Treatment: Open N/A N/A Wound Status: No N/A N/A Wound Recurrence: 3.3x2.5x0.2 N/A N/A Measurements L x W x D (cm) 6.48 N/A N/A A (cm) : rea 1.296 N/A N/A Volume (cm) : 8.20% N/A N/A % Reduction in A rea: -83.60% N/A N/A % Reduction in Volume: Full Thickness Without Exposed N/A N/A Classification: Support Structures Medium N/A N/A Exudate A mount: Serosanguineous N/A N/A Exudate Type: red, brown N/A N/A Exudate Color: Medium (34-66%) N/A N/A Granulation A mount: Red, Pink N/A N/A Granulation Quality: Medium (34-66%) N/A N/A Necrotic A mount: Fat Layer (Subcutaneous Tissue): Yes N/A N/A Exposed Structures: None N/A N/A Epithelialization: Treatment Notes TYNEKA, SCAFIDI  ([id number]) [id number] Page 4 of 7 Electronic Signature(s) Signed: 09/12/2022 4:43:31 PM By: Angelina Pih Entered By: Angelina Pih on 09/12/2022 12:36:34 -------------------------------------------------------------------------------- Multi-Disciplinary Care Plan Details Patient Name: Date of Service: Lisette Grinder, [place] [name]. 09/12/2022 12:00 PM Medical Record Number: [id number] Patient Account Number: [id number] Date of Birth/Sex: Treating RN: 02/26/1936 (87 y.o. [name] Primary Care Jandiel Magallanes: Clydie Braun Other Clinician: Referring Zachary Lovins: Treating Deya Bigos/Extender: Sydnee Cabal in Treatment: 5 Active Inactive Wound/Skin Impairment Nursing Diagnoses: Impaired tissue integrity Knowledge deficit related to ulceration/compromised skin integrity Goals: Ulcer/skin breakdown will have a volume reduction of 30% by week 4 Date Initiated: 08/08/2022 Date Inactivated: 09/05/2022 Target Resolution Date: 09/05/2022 Unmet Reason:  slow wound healing, is Goal Status: Unmet improving Ulcer/skin breakdown will have a volume reduction of 50% by week 8 Date Initiated: 08/08/2022 Target Resolution Date: 10/03/2022 Goal Status: Active Ulcer/skin breakdown will have a volume reduction of 80% by week 12 Date Initiated: 08/08/2022 Target Resolution Date: 10/31/2022 Goal Status: Active Ulcer/skin breakdown will heal within 14 weeks Date Initiated: 08/08/2022 Target Resolution Date: 11/14/2022 Goal Status: Active Interventions: Assess patient/caregiver ability to obtain necessary supplies Assess patient/caregiver ability to perform ulcer/skin care regimen upon admission and as needed Assess ulceration(s) every visit Provide education on ulcer and skin care Treatment Activities: Skin care regimen initiated : 08/08/2022 Notes: Electronic Signature(s) Signed: 09/12/2022 4:43:31 PM By: Angelina Pih Entered By: Angelina Pih on  09/12/2022 12:47:33 -------------------------------------------------------------------------------- Pain Assessment Details Patient Name: Date of Service: [name] [name]. 09/12/2022 12:00 PM Medical Record Number: [id number] Patient Account Number: [id number] Date of Birth/Sex: Treating RN: 1935-12-30 (87 y.o. [name] Primary Care Anahita Cua: Clydie Braun Other Clinician: Referring Adelynn Gipe: Treating Larrie Lucia/Extender: Sydnee Cabal in Treatment: 5 Active Problems Location of Pain Severity and Description of Pain Patient Has Paino No Site Locations [place], [place] [name] ([id number]) [id number] Page 5 of 7 Site Locations Rate the pain. Current Pain Level: 0 Pain Management and Medication Current Pain Management: Electronic Signature(s) Signed: 09/12/2022 4:43:31 PM By: Angelina Pih Entered By: Angelina Pih on 09/12/2022 12:12:21 -------------------------------------------------------------------------------- Patient/Caregiver Education Details Patient Name: Date of Service: [name] 4/23/2024andnbsp12:00 PM Medical Record Number: [id number] Patient Account Number: [id number] Date of Birth/Gender: Treating RN: [birth year] (87 y.o. [name] Primary Care Physician: Clydie Braun Other Clinician: Referring Physician: Treating Physician/Extender: Sydnee Cabal in Treatment: 5 Education Assessment Education Provided To: Patient Education Topics Provided Wound Debridement: Handouts: Wound Debridement Methods: Explain/Verbal Responses: State content correctly Wound/Skin Impairment: Handouts: Caring for Your Ulcer Methods: Explain/Verbal Responses: State content correctly Electronic Signature(s) Signed: 09/12/2022 4:43:31 PM By: Angelina Pih Entered By: Angelina Pih on 09/12/2022  12:47:47 -------------------------------------------------------------------------------- Wound Assessment Details Patient Name: Date of Service: [name] [name]. 09/12/2022 12:00 PM Medical Record Number: [id number] Patient Account Number: [id number] Date of Birth/Sex: Treating RN: [birth year] (87 y.o. [name] Primary Care Ching Rabideau: Clydie Braun Other Clinician: Referring Eragon Hammond: Treating Shayne Diguglielmo/Extender: Sydnee Cabal in Treatment: 5 Bethany, Emison [name] ([id number]) [id number] Page 6 of 7 Wound Status Wound Number: 1 Primary Etiology: Skin Tear Wound Location: Right Lower Leg Wound Status: Open Wounding Event: Trauma Comorbid History: Anemia, Hypertension, Osteoarthritis Date Acquired: 07/24/2022 Weeks Of Treatment: 5 Clustered Wound: No Photos Wound Measurements Length: (cm) 3.3 Width: (cm) 2.5 Depth: (cm) 0.2 Area: (cm) 6.48 Volume: (cm) 1.296 % Reduction in Area: 8.2% % Reduction in Volume: -83.6% Epithelialization: None Tunneling: No Undermining: No Wound Description Classification: Full Thickness Without Exposed Support Structures Exudate Amount: Medium Exudate Type: Serosanguineous Exudate Color: red, brown Foul Odor After Cleansing: No Slough/Fibrino Yes Wound Bed Granulation Amount: Medium (34-66%) Exposed Structure Granulation Quality: Red, Pink Fat Layer (Subcutaneous Tissue) Exposed: Yes Necrotic Amount: Medium (34-66%) Necrotic Quality: Adherent Slough Treatment Notes Wound #1 (Lower Leg) Wound Laterality: Right Cleanser Soap and Water Discharge Instruction: Gently cleanse wound with antibacterial soap, rinse and pat dry prior to dressing wounds Vashe 5.8 (oz) Discharge Instruction: Use vashe 5.8 (oz) as directed Peri-Wound Care Topical Primary Dressing Prisma 4.34 (in) Discharge Instruction: Moisten w/normal saline or sterile water; Cover wound as directed. Do not remove from  wound bed. Secondary Dressing (BORDER) Zetuvit Plus  SILICONE BORDER Dressing 4x4 (in/in) Discharge Instruction: Please do not put silicone bordered dressings under wraps. Use non-bordered dressing only. Secured With Tubigrip Size D, 3x10 (in/yd) Discharge Instruction: single layer Compression Wrap Compression Stockings Add-Ons Electronic Signature(s) KYLEAH, PENSABENE [name] ([id number]) [id number] Page 7 of 7 Signed: 09/12/2022 4:43:31 PM By: Angelina Pih Entered By: Angelina Pih on 09/12/2022 12:20:35 -------------------------------------------------------------------------------- Vitals Details Patient Name: Date of Service: SA Cristi Loron, Linwood Dibbles [name]. 09/12/2022 12:00 PM Medical Record Number: [id number] Patient Account Number: [id number] Date of Birth/Sex: Treating RN: Jan 08, 1936 (87 y.o. [name] Primary Care Chriss Redel: Clydie Braun Other Clinician: Referring Ricard Faulkner: Treating Anay Walter/Extender: Sydnee Cabal in Treatment: 5 Vital Signs Time Taken: 12:11 Temperature (F): 98.4 Height (in): 69 Pulse (bpm): 68 Weight (lbs): 194 Respiratory Rate (breaths/min): 18 Body Mass Index (BMI): 28.6 Blood Pressure (mmHg): 164/75 Reference Range: 80 - 120 mg / dl Electronic Signature(s) Signed: 09/12/2022 4:43:31 PM By: Angelina Pih Entered By: Angelina Pih on 09/12/2022 12:12:06

## 2022-09-13 NOTE — Therapy (Signed)
[facility] [address], [place], [address] Phone: [phone]   Fax:  [phone]  Occupational Therapy Treatment  Patient Details  Name: TAIYA NUTTING MRN: [id number] Date of Birth: 24-Jul-1935 No data recorded  Encounter Date: 09/13/2022   OT End of Session - 09/13/22 1633     Visit Number 2    Number of Visits 13    Date for OT Re-Evaluation 10/25/22    OT Start Time 1530    OT Stop Time 1628    OT Time Calculation (min) 58 min    Activity Tolerance Patient tolerated treatment well    Behavior During Therapy [facility] for tasks assessed/performed             Past Medical History:  Diagnosis Date   Arthritis    GERD (gastroesophageal reflux disease)    Hx of basal cell carcinoma 01/13/2013   left parasternal chest   Hx of basal cell carcinoma 04/26/2015   right sacral area   Hx of basal cell carcinoma 04/26/2016   left temple   Hypertension     Past Surgical History:  Procedure Laterality Date   ABDOMINAL HYSTERECTOMY     APPENDECTOMY     BACK SURGERY     COLONOSCOPY WITH PROPOFOL N/A 03/07/2017   Procedure: COLONOSCOPY WITH PROPOFOL;  Surgeon: Toledo, Boykin Nearing, MD;  Location: [facility] ENDOSCOPY;  Service: Gastroenterology;  Laterality: N/A;   ESOPHAGOGASTRODUODENOSCOPY N/A 03/07/2017   Procedure: ESOPHAGOGASTRODUODENOSCOPY (EGD);  Surgeon: Toledo, Boykin Nearing, MD;  Location: [facility] ENDOSCOPY;  Service: Gastroenterology;  Laterality: N/A;   FOOT SURGERY Left 05/2016   JOINT REPLACEMENT Bilateral 2010 2011   REPLACEMENT TOTAL KNEE BILATERAL  2010    There were no vitals filed for this visit.   Subjective Assessment - 09/13/22 1632     Subjective  I hope you can help me - my thumb still numb and fingers still cannot open - I can make fist - swelling is better    Pertinent History Per chart, recent MD note:  Ms. Cotterman returns today for re-evaluation of her left upper extremity. She is a 87 y.o.  female who I last saw in 07/04/2022. Patient is status post left rTSA with a block with Dr. Olga Millers on 09/23/2021. She states that in PACU she noticed numbness in the radial nerve distribution and was unable to extend her wrist. Since that time she has regained wrist extension. She has not noted improvement in the numbness in the radial nerve distribution. She has had multiple EMG studies which have shown electrodiagnostic evidence of recovery of the radial nerve proper. She returns today for reevaluation. She notes that she has had some new onset swelling at the dorsal aspect of her left hand. She is not sure why this happened. She has not seen any improvement in terms of her finger or thumb function. Examination of the left upper extremity shows she has normal sensation in ulnar and median nerve distributions. She has diminished sensation and pressure perception to light touch in left radial nerve distribution. Skin is intact. Fingers are warm and well perfused with brisk capillary refill and she has a palpable radial pulse. There is no tenderness to palpation throughout. 5/5 triceps. 5/5 brachioradialis. 5/5 left wrist extension through her radial wrist extenders, 0/5 EDC, 0/5 EPL. 0/5 EIP    Patient Stated Goals Pt would like to improve movement and strength of her left hand to complete daily tasks with greater ease and  independence.    Currently in Pain? No/denies                    Assess hand base splint for MC extention and thumb - review with pt application of straps and use with functional tasks Reinforce with pt weight bearing inbetween exercises to stretch flexors Facilitate digits extention rolling over red roller -2 x 10 reps Or sliding off over table edge - maintaining extention of digits - MC extention - 2 x10 reps Or place and hold exention of digits after lumbrical fist - 2x 10 reps  WB in between  Thumb PA and RA - place and hold using rubber band -2x 10 reps OA of thumb CMC   And pt has some CTS  PA and RA mostly out of MC  Opposition pt to pick up 3 cm foam block -but focus on opening and extention of digits   WB inbetween  MC's extention this date -50 to -40 place and hold After WB - and over edge of table - -35 to -50  Thumb PA 50 and RA 35 degrees  HEP review and hand out provided                OT Education - 09/13/22 1633     Education Details HEP    Person(s) Educated Patient    Methods Explanation;Demonstration;Tactile cues;Verbal cues    Comprehension Verbalized understanding              OT Short Term Goals - 09/13/22 1620       OT SHORT TERM GOAL #1   Title Pt will be independent with HEP for left wrist and hand    Baseline Eval:  continues to perform exercises from prior OT tx, will need updated HEP.    Time 3    Period Weeks    Status New    Target Date 10/02/22               OT Long Term Goals - 09/13/22 1620       OT LONG TERM GOAL #1   Title Pt will demonstrate improve MP extension by 10 degrees to facilitate greater active reach for objects    Baseline Eval:  Limited MCP extension    Time 6    Period Weeks    Status New    Target Date 10/25/22      OT LONG TERM GOAL #2   Title Pt will demonstrate improvement of grip by 10# to grasp items without dropping.    Baseline Eval:  left grip limited to 24#, difficulty maintaining grip on objects    Time 6    Period Weeks    Target Date 10/25/22      OT LONG TERM GOAL #3   Title Pt will demonstrate improved functional  strength in wrist and hand to operate manual can opener without difficulty.    Baseline Eval:  difficulty with using manual can opener    Time 6    Period Weeks    Status New    Target Date 10/25/22      OT LONG TERM GOAL #4   Title Pt will demonstrate improved hand function and coordination to engage in meal preparation tasks with modified independence.    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  She reports afterward she had numbness along the left radial nerve distribution and decreased hand and wrist motion.  She was seen for Occupational Therapy in [facility] for a limited number of visits and has not had any OT in the last 3 months.  She reports recent increase in edema in the left dorsum of her hand and continued limited motion.  She had a recent EMG study which indicated healing over time.  . She presents with limited active motion in her left hand especially for finger extension at [facility] and thumb PA and RA, decreased strength overall but also decreased grip and pinch strengths with comparison.  She acknowledges improvement since onset but feels her progress is slow and wants to see improvements in her left hand function to perform daily tasks with greater ease and independence.  Pt would benefit from skilled OT services to maximize her safety and independence with ADL and IADL tasks at home and in the community.    OT Occupational Profile and History Detailed Assessment- Review of Records and additional review of physical, cognitive, psychosocial history related to current functional performance    Occupational performance deficits (Please refer to evaluation for details): ADL's;IADL's;Leisure    Body Structure / Function / Physical Skills ADL;ROM;UE functional use;FMC;Dexterity;Sensation;Edema;Strength;Coordination;IADL    Psychosocial Skills Habits;Routines and Behaviors;Environmental  Adaptations    Rehab Potential Good    Clinical Decision Making Several treatment options, min-mod task modification necessary    Comorbidities Affecting  Occupational Performance: May have comorbidities impacting occupational performance    Modification or Assistance to Complete Evaluation  No modification of tasks or assist necessary to complete eval    OT Frequency 2x / week    OT Duration 6 weeks    OT Treatment/Interventions Self-care/ADL training;Electrical Stimulation;Therapeutic exercise;Coping strategies training;Moist Heat;Paraffin;Neuromuscular education;Splinting;Patient/family education;Fluidtherapy;Therapeutic activities;Cryotherapy;Ultrasound;Contrast Bath;DME and/or AE instruction;Manual Therapy;Passive range of motion    Consulted and Agree with Plan of Care Patient             Patient will benefit from skilled therapeutic intervention in order to improve the following deficits and impairments:   Body Structure / Function / Physical Skills: ADL, ROM, UE functional use, FMC, Dexterity, Sensation, Edema, Strength, Coordination, IADL   Psychosocial Skills: Habits, Routines and Behaviors, Environmental  Adaptations   Visit Diagnosis: Other lack of coordination  Muscle weakness (generalized)  Other disturbances of skin sensation  Localized edema    Problem List Patient Active Problem List   Diagnosis Date Noted   CVA (cerebral infarction) 11/28/2015    Oletta Cohn, OTR/L,CLT 09/13/2022, 4:46 PM  [facility] [address], [place], [address] Phone: [phone]   Fax:  [phone]  Name: JEMIMA PETKO MRN: [id number] Date of Birth: Mar 17, 1936

## 2022-09-15 DIAGNOSIS — M810 Age-related osteoporosis without current pathological fracture: Secondary | ICD-10-CM | POA: Diagnosis not present

## 2022-09-19 ENCOUNTER — Encounter: Payer: HMO | Admitting: Physician Assistant

## 2022-09-19 DIAGNOSIS — S81811A Laceration without foreign body, right lower leg, initial encounter: Secondary | ICD-10-CM | POA: Diagnosis not present

## 2022-09-19 DIAGNOSIS — S81801A Unspecified open wound, right lower leg, initial encounter: Secondary | ICD-10-CM | POA: Diagnosis not present

## 2022-09-20 NOTE — Progress Notes (Addendum)
NEESA, KNAPIK R ([id number]) [id number] Page 1 of 6 Visit Report for 09/19/2022 Chief Complaint Document Details Patient Name: Date of Service: [name] 09/19/2022 12:00 PM Medical Record Number: [id number] Patient Account Number: [id number] Date of Birth/Sex: Treating RN: 19-Jun-1935 (87 y.o. [name] Primary Care Provider: Clydie Braun Other Clinician: Referring Provider: Treating Provider/Extender: Sydnee Cabal in Treatment: 6 Information Obtained from: Patient Chief Complaint Skin tear right leg 07/24/22 Electronic Signature(s) Signed: 09/19/2022 12:32:25 PM By: Allen Derry PA-C Entered By: Allen Derry on 09/19/2022 12:32:25 -------------------------------------------------------------------------------- HPI Details Patient Name: Date of Service: [name] 09/19/2022 12:00 PM Medical Record Number: [id number] Patient Account Number: [id number] Date of Birth/Sex: Treating RN: [birth year] (87 y.o. [name] Primary Care Provider: Clydie Braun Other Clinician: Referring Provider: Treating Provider/Extender: Sydnee Cabal in Treatment: 6 History of Present Illness HPI Description: 08-08-2022 upon evaluation today patient appears for initial inspection here in our clinic concerning issues that she has been having with a wound on her right anterior lower leg where she dropped a shredder on her leg causing a skin tear. Unfortunately this was attempted to be reapproximated but that has not been extremely effective for her. Fortunately there does not appear to be any signs of infection systemically which is great news. With that being said there is local infection definitely noted at this point. She has been on antibiotics. Currently cefdinir and doxycycline where the 2 antibiotics that were used up to this point. This original injury occurred on March 4. Her culture  showed Enterococcus faecium and that was on 08-06-2022 that was resulted. Her primary care provider Dr. Sampson Goon who is also an infectious disease specialist that stated that if she was not better at the end of her current cefdinir and doxycycline regimen that he would likely put her on Augmentin or amoxicillin. I think we may want to switch her to Augmentin at this point. Patient has a history of hypertension and peripheral vascular disease but really no other major medical problems at this point. With that being said although she did not show any signs of infection at this point I feel like this is something that might heal readily. However because of the screen showing PAD here in the clinic on the ABIs if she does not show signs of improvement over the next that I think we may want to go ahead and see about getting her to vascular. Further evaluation and treatment. Will however see how things do over the next week. 08-15-2022 upon evaluation today patient appears to be doing well currently in regard to her wound. She has been tolerating the dressing changes and overall seems to be doing quite well with regard to her wound I am seeing some definite improvement here. With that being said I do believe that she is making good progress with regard to the Iodoflex which I think is cleaning up the surface of the wound quite well I am pleased in that regard. She has started on the Augmentin currently and seems to be doing very well with that. 08-22-2022 upon evaluation today patient appears to be doing well currently in regard to her wound. She is actually tolerating the dressing changes without complication. Fortunately there does not appear to be any signs of active infection at this time which is great news and overall I do believe that she is significantly improved with the Augmentin which is excellent as well. 08-29-2022 upon evaluation today  patient appears to be doing well currently in regard to her  wound which is actually making some pretty good progress here. I am extremely pleased with where we stand I think she is headed in the right direction and in general I do believe that we are making leaps and bounds towards getting this completely closed. With that being said I do think it is time to go ahead and switch over to collagen in place of the Iodoflex I think you will feel more comfortable and I think it will help with additional tissue growth much more effectively at this point. 09-05-2022 upon evaluation today patient appears to be doing well currently in regard to her wound which is actually showing signs of excellent improvement. Fortunately I do not see any evidence of active infection locally nor systemically at this time which is great news. 09-12-2022 upon evaluation today patient appears to be doing well currently in regard to her wound. She has been tolerating the dressing changes without complication and very pleased in that regard. Fortunately there does not appear to be any signs of active infection locally nor systemically which is great news. No fevers, chills, nausea, vomiting, or diarrhea. 09-19-2022 upon evaluation today patient appears to be doing well currently in regard to her wound is actually was measuring smaller, looking better, and overall seems to be an excellent track towards complete closure. Overall I do believe that she is making good progress here. Electronic Signature(s) Signed: 09/19/2022 6:14:20 PM By: Allen Derry PA-C Entered By: Allen Derry on 09/19/2022 18:14:20 Dejuana Boys R ([id number]) [id number] Page 2 of 6 -------------------------------------------------------------------------------- Physical Exam Details Patient Name: Date of Service: [name] 09/19/2022 12:00 PM Medical Record Number: [id number] Patient Account Number: [id number] Date of Birth/Sex: Treating RN: April 16, 1936 (87 y.o. [name] Primary Care Provider: Clydie Braun Other Clinician: Referring Provider: Treating Provider/Extender: Sydnee Cabal in Treatment: 6 Constitutional Well-nourished and well-hydrated in no acute distress. Respiratory normal breathing without difficulty. Psychiatric this patient is able to make decisions and demonstrates good insight into disease process. Alert and Oriented x 3. pleasant and cooperative. Notes Upon inspection patient's wound bed showed signs again of doing quite well and I am actually very pleased with where we stand I do believe that continuing with the collagen is good to be the way to go she is in agreement with that plan. Electronic Signature(s) Signed: 09/19/2022 6:14:33 PM By: Allen Derry PA-C Entered By: Allen Derry on 09/19/2022 18:14:33 -------------------------------------------------------------------------------- Physician Orders Details Patient Name: Date of Service: [name] 09/19/2022 12:00 PM Medical Record Number: [id number] Patient Account Number: [id number] Date of Birth/Sex: Treating RN: 1935-12-31 (87 y.o. [name] Primary Care Provider: Clydie Braun Other Clinician: Referring Provider: Treating Provider/Extender: Sydnee Cabal in Treatment: 6 Verbal / Phone Orders: No Diagnosis Coding ICD-10 Coding Code Description [phone] Laceration without foreign body, right lower leg, initial encounter L97.812 Non-pressure chronic ulcer of other part of right lower leg with fat layer exposed I73.89 Other specified peripheral vascular diseases I10 Essential (primary) hypertension Follow-up Appointments Return Appointment in 1 week. Bathing/ Shower/ Hygiene May shower; gently cleanse wound with antibacterial soap, rinse and pat dry prior to dressing wounds No tub bath. Anesthetic (Use 'Patient Medications' Section for Anesthetic Order Entry) Wound #1 Right Lower  Leg Lidocaine applied to wound bed Edema Control - Lymphedema / Segmental Compressive Device / Other Tubigrip single layer applied. - size D Elevate, Exercise Daily  and A void Standing for Long Periods of Time. Elevate legs to the level of the heart and pump ankles as often as possible Elevate leg(s) parallel to the floor when sitting. DO YOUR BEST to sleep in the bed at night. DO NOT sleep in your recliner. Long hours of sitting in a recliner leads to swelling of the legs and/or potential wounds on your backside. Medications-Please add to medication list. ntibiotics - Continue antibiotics as prescribed P.O. JOSEPHENE, MARRONE R ([id number]) [id number] Page 3 of 6 Wound Treatment Wound #1 - Lower Leg Wound Laterality: Right Cleanser: Soap and Water 3 x Per Week/30 Days Discharge Instructions: Gently cleanse wound with antibacterial soap, rinse and pat dry prior to dressing wounds Cleanser: Vashe 5.8 (oz) 3 x Per Week/30 Days Discharge Instructions: Use vashe 5.8 (oz) as directed Prim Dressing: Prisma 4.34 (in) (Generic) 3 x Per Week/30 Days ary Discharge Instructions: Moisten w/normal saline or sterile water; Cover wound as directed. Do not remove from wound bed. Secondary Dressing: (BORDER) Zetuvit Plus SILICONE BORDER Dressing 4x4 (in/in) (Generic) 3 x Per Week/30 Days Discharge Instructions: Please do not put silicone bordered dressings under wraps. Use non-bordered dressing only. Secured With: Tubigrip Size D, 3x10 (in/yd) 3 x Per Week/30 Days Discharge Instructions: single layer Electronic Signature(s) Signed: 09/19/2022 4:19:27 PM By: Angelina Pih Signed: 09/19/2022 6:18:39 PM By: Allen Derry PA-C Entered By: Angelina Pih on 09/19/2022 12:44:09 -------------------------------------------------------------------------------- Problem List Details Patient Name: Date of Service: Lavell Anchors RA R. 09/19/2022 12:00 PM Medical Record Number:  [id number] Patient Account Number: [id number] Date of Birth/Sex: Treating RN: January 11, 1936 (87 y.o. [name] Primary Care Provider: Clydie Braun Other Clinician: Referring Provider: Treating Provider/Extender: Sydnee Cabal in Treatment: 6 Active Problems ICD-10 Encounter Code Description Active Date MDM Diagnosis S81.811A Laceration without foreign body, right lower leg, initial encounter 08/08/2022 No Yes L97.812 Non-pressure chronic ulcer of other part of right lower leg with fat layer 08/08/2022 No Yes exposed I73.89 Other specified peripheral vascular diseases 08/08/2022 No Yes I10 Essential (primary) hypertension 08/08/2022 No Yes Inactive Problems Resolved Problems Electronic Signature(s) Signed: 09/19/2022 12:32:09 PM By: Allen Derry PA-C Entered By: Allen Derry on 09/19/2022 12:32:09 -------------------------------------------------------------------------------- Progress Note Details Patient Name: Date of Service: [name] 09/19/2022 12:00 PM Medical Record Number: [id number] Patient Account Number: [id number] THATIANA, RENBARGER ([id number]) [phone].pdf Page 4 of 6 Date of Birth/Sex: Treating RN: February 21, 1936 (87 y.o. [name] Primary Care Provider: Other Clinician: Clydie Braun Referring Provider: Treating Provider/Extender: Sydnee Cabal in Treatment: 6 Subjective Chief Complaint Information obtained from Patient Skin tear right leg 07/24/22 History of Present Illness (HPI) 08-08-2022 upon evaluation today patient appears for initial inspection here in our clinic concerning issues that she has been having with a wound on her right anterior lower leg where she dropped a shredder on her leg causing a skin tear. Unfortunately this was attempted to be reapproximated but that has not been extremely effective for her. Fortunately there does not appear to be any signs of  infection systemically which is great news. With that being said there is local infection definitely noted at this point. She has been on antibiotics. Currently cefdinir and doxycycline where the 2 antibiotics that were used up to this point. This original injury occurred on March 4. Her culture showed Enterococcus faecium and that was on 08-06-2022 that was resulted. Her primary care provider Dr. Sampson Goon who is also an infectious disease specialist that stated that  if she was not better at the end of her current cefdinir and doxycycline regimen that he would likely put her on Augmentin or amoxicillin. I think we may want to switch her to Augmentin at this point. Patient has a history of hypertension and peripheral vascular disease but really no other major medical problems at this point. With that being said although she did not show any signs of infection at this point I feel like this is something that might heal readily. However because of the screen showing PAD here in the clinic on the ABIs if she does not show signs of improvement over the next that I think we may want to go ahead and see about getting her to vascular. Further evaluation and treatment. Will however see how things do over the next week. 08-15-2022 upon evaluation today patient appears to be doing well currently in regard to her wound. She has been tolerating the dressing changes and overall seems to be doing quite well with regard to her wound I am seeing some definite improvement here. With that being said I do believe that she is making good progress with regard to the Iodoflex which I think is cleaning up the surface of the wound quite well I am pleased in that regard. She has started on the Augmentin currently and seems to be doing very well with that. 08-22-2022 upon evaluation today patient appears to be doing well currently in regard to her wound. She is actually tolerating the dressing changes without complication.  Fortunately there does not appear to be any signs of active infection at this time which is great news and overall I do believe that she is significantly improved with the Augmentin which is excellent as well. 08-29-2022 upon evaluation today patient appears to be doing well currently in regard to her wound which is actually making some pretty good progress here. I am extremely pleased with where we stand I think she is headed in the right direction and in general I do believe that we are making leaps and bounds towards getting this completely closed. With that being said I do think it is time to go ahead and switch over to collagen in place of the Iodoflex I think you will feel more comfortable and I think it will help with additional tissue growth much more effectively at this point. 09-05-2022 upon evaluation today patient appears to be doing well currently in regard to her wound which is actually showing signs of excellent improvement. Fortunately I do not see any evidence of active infection locally nor systemically at this time which is great news. 09-12-2022 upon evaluation today patient appears to be doing well currently in regard to her wound. She has been tolerating the dressing changes without complication and very pleased in that regard. Fortunately there does not appear to be any signs of active infection locally nor systemically which is great news. No fevers, chills, nausea, vomiting, or diarrhea. 09-19-2022 upon evaluation today patient appears to be doing well currently in regard to her wound is actually was measuring smaller, looking better, and overall seems to be an excellent track towards complete closure. Overall I do believe that she is making good progress here. Objective Constitutional Well-nourished and well-hydrated in no acute distress. Vitals Time Taken: 12:05 PM, Height: 69 in, Weight: 194 lbs, BMI: 28.6, Temperature: 98 F, Pulse: 78 bpm, Respiratory Rate: 18 breaths/min,  Blood Pressure: 148/84 mmHg. Respiratory normal breathing without difficulty. Psychiatric this patient is able to make decisions  and demonstrates good insight into disease process. Alert and Oriented x 3. pleasant and cooperative. General Notes: Upon inspection patient's wound bed showed signs again of doing quite well and I am actually very pleased with where we stand I do believe that continuing with the collagen is good to be the way to go she is in agreement with that plan. Integumentary (Hair, Skin) Wound #1 status is Open. Original cause of wound was Trauma. The date acquired was: 07/24/2022. The wound has been in treatment 6 weeks. The wound is located on the Right Lower Leg. The wound measures 2.7cm length x 2.2cm width x 0.2cm depth; 4.665cm^2 area and 0.933cm^3 volume. There is Fat Layer (Subcutaneous Tissue) exposed. There is no tunneling or undermining noted. There is a medium amount of serosanguineous drainage noted. There is large (67- 100%) red, pink granulation within the wound bed. There is a small (1-33%) amount of necrotic tissue within the wound bed including Adherent Slough. Assessment RAYOLA, EVERHART R ([id number]) [id number] Page 5 of 6 Active Problems ICD-10 Laceration without foreign body, right lower leg, initial encounter Non-pressure chronic ulcer of other part of right lower leg with fat layer exposed Other specified peripheral vascular diseases Essential (primary) hypertension Plan Follow-up Appointments: Return Appointment in 1 week. Bathing/ Shower/ Hygiene: May shower; gently cleanse wound with antibacterial soap, rinse and pat dry prior to dressing wounds No tub bath. Anesthetic (Use 'Patient Medications' Section for Anesthetic Order Entry): Wound #1 Right Lower Leg: Lidocaine applied to wound bed Edema Control - Lymphedema / Segmental Compressive Device / Other: Tubigrip single layer applied. - size D Elevate, Exercise  Daily and Avoid Standing for Long Periods of Time. Elevate legs to the level of the heart and pump ankles as often as possible Elevate leg(s) parallel to the floor when sitting. DO YOUR BEST to sleep in the bed at night. DO NOT sleep in your recliner. Long hours of sitting in a recliner leads to swelling of the legs and/or potential wounds on your backside. Medications-Please add to medication list.: P.O. Antibiotics - Continue antibiotics as prescribed WOUND #1: - Lower Leg Wound Laterality: Right Cleanser: Soap and Water 3 x Per Week/30 Days Discharge Instructions: Gently cleanse wound with antibacterial soap, rinse and pat dry prior to dressing wounds Cleanser: Vashe 5.8 (oz) 3 x Per Week/30 Days Discharge Instructions: Use vashe 5.8 (oz) as directed Prim Dressing: Prisma 4.34 (in) (Generic) 3 x Per Week/30 Days ary Discharge Instructions: Moisten w/normal saline or sterile water; Cover wound as directed. Do not remove from wound bed. Secondary Dressing: (BORDER) Zetuvit Plus SILICONE BORDER Dressing 4x4 (in/in) (Generic) 3 x Per Week/30 Days Discharge Instructions: Please do not put silicone bordered dressings under wraps. Use non-bordered dressing only. Secured With: Tubigrip Size D, 3x10 (in/yd) 3 x Per Week/30 Days Discharge Instructions: single layer 1. I would recommend that we have the patient continue to monitor for any signs of infection or worsening. Based on what I am seeing I do believe that removing in the right direction here. 2. I am also can recommend that the patient should continue to utilize the Zetuvit to cover over top of the collagen which I think is also doing well she is using the Tubigrip size D. We will see patient back for reevaluation in 1 week here in the clinic. If anything worsens or changes patient will contact our office for additional recommendations. Electronic Signature(s) Signed: 09/19/2022 6:15:00 PM By: Allen Derry PA-C Entered By: Allen Derry on  09/19/2022 18:15:00 --------------------------------------------------------------------------------  SuperBill Details Patient Name: Date of Service: [name] 09/19/2022 Medical Record Number: [id number] Patient Account Number: [id number] Date of Birth/Sex: Treating RN: April 19, 1936 (87 y.o. [name] Primary Care Provider: Clydie Braun Other Clinician: Referring Provider: Treating Provider/Extender: Sydnee Cabal in Treatment: 6 Diagnosis Coding ICD-10 Codes Code Description [phone] Laceration without foreign body, right lower leg, initial encounter L97.812 Non-pressure chronic ulcer of other part of right lower leg with fat layer exposed I73.89 Other specified peripheral vascular diseases I10 Essential (primary) hypertension STORM, DULSKI R ([id number]) [phone].pdf Page 6 of 6 Facility Procedures : CPT4 Code: [id number] Description: 99213 - WOUND CARE VISIT-LEV 3 EST PT Modifier: Quantity: 1 Physician Procedures : CPT4 Code Description Modifier [id number] 99213 - WC PHYS LEVEL 3 - EST PT ICD-10 Diagnosis Description S81.811A Laceration without foreign body, right lower leg, initial encounter L97.812 Non-pressure chronic ulcer of other part of right lower leg with  fat layer exposed I73.89 Other specified peripheral vascular diseases I10 Essential (primary) hypertension Quantity: 1 Electronic Signature(s) Signed: 09/19/2022 6:15:19 PM By: Allen Derry PA-C Previous Signature: 09/19/2022 4:19:27 PM Version By: Angelina Pih Entered By: Allen Derry on 09/19/2022 18:15:19

## 2022-09-20 NOTE — Progress Notes (Signed)
[name], [name] ([id number]) [id number] Page 1 of 7 Visit Report for 09/19/2022 Arrival Information Details Patient Name: Date of Service: [name]. 09/19/2022 12:00 PM Medical Record Number: [id number] Patient Account Number: [id number] Date of Birth/Sex: Treating RN: [birth year] (87 y.o. [name] Primary Care Laasya Peyton: Clydie Braun Other Clinician: Referring Dashanae Longfield: Treating Mahamed Zalewski/Extender: Sydnee Cabal in Treatment: 6 Visit Information History Since Last Visit Added or deleted any medications: No Patient Arrived: Ambulatory Any new allergies or adverse reactions: No Arrival Time: 12:02 Had a fall or experienced change in Yes Accompanied By: self activities of daily living that may affect Transfer Assistance: None risk of falls: Patient Identification Verified: Yes Hospitalized since last visit: No Secondary Verification Process Completed: Yes Has Dressing in Place as Prescribed: Yes Patient Has Alerts: Yes Pain Present Now: No Patient Alerts: Patient on Blood Thinner 08/08/22 ABI Right PAD 08/08/22 ABI Left PAD Electronic Signature(s) Signed: 09/19/2022 4:19:27 PM By: Angelina Pih Entered By: Angelina Pih on 09/19/2022 12:05:22 -------------------------------------------------------------------------------- Clinic Level of Care Assessment Details Patient Name: Date of Service: [name] 09/19/2022 12:00 PM Medical Record Number: [id number] Patient Account Number: [id number] Date of Birth/Sex: Treating RN: 09/02/1935 (87 y.o. [name] Primary Care Vaneza Pickart: Clydie Braun Other Clinician: Referring Jagger Demonte: Treating Naveh Rickles/Extender: Sydnee Cabal in Treatment: 6 Clinic Level of Care Assessment Items TOOL 4 Quantity Score []  - 0 Use when only an EandM is performed on FOLLOW-UP visit ASSESSMENTS - Nursing Assessment / Reassessment X- 1  10 Reassessment of Co-morbidities (includes updates in patient status) X- 1 5 Reassessment of Adherence to Treatment Plan ASSESSMENTS - Wound and Skin A ssessment / Reassessment X - Simple Wound Assessment / Reassessment - one wound 1 5 []  - 0 Complex Wound Assessment / Reassessment - multiple wounds []  - 0 Dermatologic / Skin Assessment (not related to wound area) ASSESSMENTS - Focused Assessment []  - 0 Circumferential Edema Measurements - multi extremities []  - 0 Nutritional Assessment / Counseling / Intervention []  - 0 Lower Extremity Assessment (monofilament, tuning fork, pulses) []  - 0 Peripheral Arterial Disease Assessment (using hand held doppler) ASSESSMENTS - Ostomy and/or Continence Assessment and Care []  - 0 Incontinence Assessment and Management []  - 0 Ostomy Care Assessment and Management (repouching, etc.) PROCESS - Coordination of Care X - Simple Patient / Family Education for ongoing care 1 15 []  - 0 Complex (extensive) Patient / Family Education for ongoing care [name], [name] ([id number]) [id number] Page 2 of 7 X- 1 10 Staff obtains Consents, Records, T Results / Process Orders est []  - 0 Staff telephones HHA, Nursing Homes / Clarify orders / etc []  - 0 Routine Transfer to another Facility (non-emergent condition) []  - 0 Routine Hospital Admission (non-emergent condition) []  - 0 New Admissions / Manufacturing engineer / Ordering NPWT Apligraf, etc. , []  - 0 Emergency Hospital Admission (emergent condition) X- 1 10 Simple Discharge Coordination []  - 0 Complex (extensive) Discharge Coordination PROCESS - Special Needs []  - 0 Pediatric / Minor Patient Management []  - 0 Isolation Patient Management []  - 0 Hearing / Language / Visual special needs []  - 0 Assessment of Community assistance (transportation, D/C planning, etc.) []  - 0 Additional assistance / Altered mentation []  - 0 Support Surface(s) Assessment  (bed, cushion, seat, etc.) INTERVENTIONS - Wound Cleansing / Measurement X - Simple Wound Cleansing - one wound 1 5 []  - 0 Complex Wound Cleansing - multiple wounds X- 1 5 Wound Imaging (  photographs - any number of wounds) []  - 0 Wound Tracing (instead of photographs) X- 1 5 Simple Wound Measurement - one wound []  - 0 Complex Wound Measurement - multiple wounds INTERVENTIONS - Wound Dressings X - Small Wound Dressing one or multiple wounds 1 10 []  - 0 Medium Wound Dressing one or multiple wounds []  - 0 Large Wound Dressing one or multiple wounds X- 1 5 Application of Medications - topical []  - 0 Application of Medications - injection INTERVENTIONS - Miscellaneous []  - 0 External ear exam []  - 0 Specimen Collection (cultures, biopsies, blood, body fluids, etc.) []  - 0 Specimen(s) / Culture(s) sent or taken to Lab for analysis []  - 0 Patient Transfer (multiple staff / Nurse, adult / Similar devices) []  - 0 Simple Staple / Suture removal (25 or less) []  - 0 Complex Staple / Suture removal (26 or more) []  - 0 Hypo / Hyperglycemic Management (close monitor of Blood Glucose) []  - 0 Ankle / Brachial Index (ABI) - do not check if billed separately X- 1 5 Vital Signs Has the patient been seen at the hospital within the last three years: Yes Total Score: 90 Level Of Care: New/Established - Level 3 Electronic Signature(s) Signed: 09/19/2022 4:19:27 PM By: Angelina Pih Entered By: Angelina Pih on 09/19/2022 12:44:33 [name] ([id number]) [id number] Page 3 of 7 -------------------------------------------------------------------------------- Encounter Discharge Information Details Patient Name: Date of Service: [name] 09/19/2022 12:00 PM Medical Record Number: [id number] Patient Account Number: [id number] Date of Birth/Sex: Treating RN: Nov 25, 1935 (87 y.o. [name] Primary Care Mykenzie Ebanks: Clydie Braun Other  Clinician: Referring Hazeline Charnley: Treating Stacey Maura/Extender: Sydnee Cabal in Treatment: 6 Encounter Discharge Information Items Discharge Condition: Stable Ambulatory Status: Ambulatory Discharge Destination: Home Transportation: Private Auto Accompanied By: self Schedule Follow-up Appointment: Yes Clinical Summary of Care: Electronic Signature(s) Signed: 09/19/2022 4:19:27 PM By: Angelina Pih Entered By: Angelina Pih on 09/19/2022 12:45:56 -------------------------------------------------------------------------------- Lower Extremity Assessment Details Patient Name: Date of Service: [name] 09/19/2022 12:00 PM Medical Record Number: [id number] Patient Account Number: [id number] Date of Birth/Sex: Treating RN: 06/08/1935 (87 y.o. [name] Primary Care Steven Basso: Clydie Braun Other Clinician: Referring Zaylan Kissoon: Treating Abrar Koone/Extender: Sydnee Cabal in Treatment: 6 Edema Assessment Assessed: [Left: No] [Right: No] Edema: [Left: Ye] [Right: s] Calf Left: Right: Point of Measurement: 34 cm From Medial Instep 43 cm Ankle Left: Right: Point of Measurement: 11 cm From Medial Instep 25 cm Vascular Assessment Pulses: Dorsalis Pedis Palpable: [Right:Yes] Posterior Tibial Palpable: [Right:Yes] Electronic Signature(s) Signed: 09/19/2022 4:19:27 PM By: Angelina Pih Entered By: Angelina Pih on 09/19/2022 12:14:36 -------------------------------------------------------------------------------- Multi Wound Chart Details Patient Name: Date of Service: Brooke Anchors [name]. 09/19/2022 12:00 PM Medical Record Number: [id number] Patient Account Number: [id number] Date of Birth/Sex: Treating RN: [birth year] (87 y.o. [name] Primary Care Alfreda Hammad: Clydie Braun Other Clinician: Referring Rionna Feltes: Treating Dyanne Yorks/Extender: Sydnee Cabal in Treatment: [address]  Gustavus [name] ([id number]) [id number] Page 4 of 7 Vital Signs Height(in): 69 Pulse(bpm): 78 Weight(lbs): 194 Blood Pressure(mmHg): 148/84 Body Mass Index(BMI): 28.6 Temperature(F): 98 Respiratory Rate(breaths/min): 18 [1:Photos:] [N/A:N/A] Right Lower Leg N/A N/A Wound Location: Trauma N/A N/A Wounding Event: Skin Tear N/A N/A Primary Etiology: Anemia, Hypertension, Osteoarthritis N/A N/A Comorbid History: 07/24/2022 N/A N/A Date Acquired: 6 N/A N/A Weeks of Treatment: Open N/A N/A Wound Status: No N/A N/A Wound Recurrence: 2.7x2.2x0.2 N/A N/A Measurements L x W x D (cm) 4.665 N/A N/A  A (cm) : rea 0.933 N/A N/A Volume (cm) : 33.90% N/A N/A % Reduction in A rea: -32.20% N/A N/A % Reduction in Volume: Full Thickness Without Exposed N/A N/A Classification: Support Structures Medium N/A N/A Exudate A mount: Serosanguineous N/A N/A Exudate Type: red, brown N/A N/A Exudate Color: Large (67-100%) N/A N/A Granulation A mount: Red, Pink N/A N/A Granulation Quality: Small (1-33%) N/A N/A Necrotic A mount: Fat Layer (Subcutaneous Tissue): Yes N/A N/A Exposed Structures: None N/A N/A Epithelialization: Treatment Notes Electronic Signature(s) Signed: 09/19/2022 4:19:27 PM By: Angelina Pih Entered By: Angelina Pih on 09/19/2022 12:34:02 -------------------------------------------------------------------------------- Multi-Disciplinary Care Plan Details Patient Name: Date of Service: Lisette Grinder, Brooke Dibbles [name]. 09/19/2022 12:00 PM Medical Record Number: [id number] Patient Account Number: [id number] Date of Birth/Sex: Treating RN: 1935-05-27 (87 y.o. [name] Primary Care Merla Sawka: Clydie Braun Other Clinician: Referring Meerab Maselli: Treating Keishawna Carranza/Extender: Sydnee Cabal in Treatment: 6 Active Inactive Wound/Skin Impairment Nursing Diagnoses: Impaired tissue integrity Knowledge deficit related to  ulceration/compromised skin integrity Goals: Ulcer/skin breakdown will have a volume reduction of 30% by week 4 Date Initiated: 08/08/2022 Date Inactivated: 09/05/2022 Target Resolution Date: 09/05/2022 Unmet Reason: slow wound healing, is Goal Status: Unmet [name], [name] ([id number]) [id number] Page 5 of 7 Goal Status: Unmet improving Ulcer/skin breakdown will have a volume reduction of 50% by week 8 Date Initiated: 08/08/2022 Target Resolution Date: 10/03/2022 Goal Status: Active Ulcer/skin breakdown will have a volume reduction of 80% by week 12 Date Initiated: 08/08/2022 Target Resolution Date: 10/31/2022 Goal Status: Active Ulcer/skin breakdown will heal within 14 weeks Date Initiated: 08/08/2022 Target Resolution Date: 11/14/2022 Goal Status: Active Interventions: Assess patient/caregiver ability to obtain necessary supplies Assess patient/caregiver ability to perform ulcer/skin care regimen upon admission and as needed Assess ulceration(s) every visit Provide education on ulcer and skin care Treatment Activities: Skin care regimen initiated : 08/08/2022 Notes: Electronic Signature(s) Signed: 09/19/2022 4:19:27 PM By: Angelina Pih Entered By: Angelina Pih on 09/19/2022 12:44:49 -------------------------------------------------------------------------------- Pain Assessment Details Patient Name: Date of Service: Brooke Anchors [name]. 09/19/2022 12:00 PM Medical Record Number: [id number] Patient Account Number: [id number] Date of Birth/Sex: Treating RN: 08-Sep-1935 (87 y.o. [name] Primary Care Alieyah Spader: Clydie Braun Other Clinician: Referring Leeona Mccardle: Treating Konica Stankowski/Extender: Sydnee Cabal in Treatment: 6 Active Problems Location of Pain Severity and Description of Pain Patient Has Paino No Site Locations Rate the pain. Current Pain Level: 0 Pain Management and Medication Current Pain  Management: Electronic Signature(s) Signed: 09/19/2022 4:19:27 PM By: Angelina Pih Entered By: Angelina Pih on 09/19/2022 12:07:25 Patient/Caregiver Education Details -------------------------------------------------------------------------------- [name] ([id number]) [id number] Page 6 of 7 Patient Name: Date of Service: [name] 4/30/2024andnbsp12:00 PM Medical Record Number: [id number] Patient Account Number: [id number] Date of Birth/Gender: Treating RN: [birth year] (87 y.o. [name] Primary Care Physician: Clydie Braun Other Clinician: Referring Physician: Treating Physician/Extender: Sydnee Cabal in Treatment: 6 Education Assessment Education Provided To: Patient Education Topics Provided Wound/Skin Impairment: Handouts: Caring for Your Ulcer Methods: Explain/Verbal Responses: State content correctly Electronic Signature(s) Signed: 09/19/2022 4:19:27 PM By: Angelina Pih Entered By: Angelina Pih on 09/19/2022 12:44:59 -------------------------------------------------------------------------------- Wound Assessment Details Patient Name: Date of Service: Brooke Anchors [name]. 09/19/2022 12:00 PM Medical Record Number: [id number] Patient Account Number: [id number] Date of Birth/Sex: Treating RN: [birth year] (87 y.o. [name] Primary Care Jamile Sivils: Clydie Braun Other Clinician: Referring Arnett Galindez: Treating Sundae Maners/Extender: Sydnee Cabal in Treatment: 6 Wound Status Wound Number:  1 Primary Etiology: Skin Tear Wound Location: Right Lower Leg Wound Status: Open Wounding Event: Trauma Comorbid History: Anemia, Hypertension, Osteoarthritis Date Acquired: 07/24/2022 Weeks Of Treatment: 6 Clustered Wound: No Photos Wound Measurements Length: (cm) 2.7 Width: (cm) 2.2 Depth: (cm) 0.2 Area: (cm) 4.665 Volume: (cm) 0.933 % Reduction in Area:  33.9% % Reduction in Volume: -32.2% Epithelialization: None Tunneling: No Undermining: No Wound Description Classification: Full Thickness Without Exposed Support Structures Exudate Amount: Medium Exudate Type: Serosanguineous Exudate Color: red, brown Foul Odor After Cleansing: No Slough/Fibrino Yes Wound Bed [name], [name] ([id number]) [phone].pdf Page 7 of 7 Granulation Amount: Large (67-100%) Exposed Structure Granulation Quality: Red, Pink Fat Layer (Subcutaneous Tissue) Exposed: Yes Necrotic Amount: Small (1-33%) Necrotic Quality: Adherent Slough Treatment Notes Wound #1 (Lower Leg) Wound Laterality: Right Cleanser Soap and Water Discharge Instruction: Gently cleanse wound with antibacterial soap, rinse and pat dry prior to dressing wounds Vashe 5.8 (oz) Discharge Instruction: Use vashe 5.8 (oz) as directed Peri-Wound Care Topical Primary Dressing Prisma 4.34 (in) Discharge Instruction: Moisten w/normal saline or sterile water; Cover wound as directed. Do not remove from wound bed. Secondary Dressing (BORDER) Zetuvit Plus SILICONE BORDER Dressing 4x4 (in/in) Discharge Instruction: Please do not put silicone bordered dressings under wraps. Use non-bordered dressing only. Secured With Tubigrip Size D, 3x10 (in/yd) Discharge Instruction: single layer Compression Wrap Compression Stockings Add-Ons Electronic Signature(s) Signed: 09/19/2022 4:19:27 PM By: Angelina Pih Entered By: Angelina Pih on 09/19/2022 12:14:28 -------------------------------------------------------------------------------- Vitals Details Patient Name: Date of Service: [name], Brooke Dibbles [name]. 09/19/2022 12:00 PM Medical Record Number: [id number] Patient Account Number: [id number] Date of Birth/Sex: Treating RN: 07-21-1935 (87 y.o. [name] Primary Care Basia Mcginty: Clydie Braun Other Clinician: Referring Carlie Solorzano: Treating Forestine Macho/Extender: Sydnee Cabal in Treatment: 6 Vital Signs Time Taken: 12:05 Temperature (F): 98 Height (in): 69 Pulse (bpm): 78 Weight (lbs): 194 Respiratory Rate (breaths/min): 18 Body Mass Index (BMI): 28.6 Blood Pressure (mmHg): 148/84 Reference Range: 80 - 120 mg / dl Electronic Signature(s) Signed: 09/19/2022 4:19:27 PM By: Angelina Pih Entered By: Angelina Pih on 09/19/2022 12:07:19

## 2022-09-20 NOTE — Progress Notes (Signed)
DEVRI, KREHER R ([id number]) [id number] Page 1 of 1 Visit Report for 09/19/2022 Fall Risk Assessment Details Patient Name: Date of Service: [name]. 09/19/2022 12:00 PM Medical Record Number: [id number] Patient Account Number: [id number] Date of Birth/Sex: Treating RN: [birth year] (87 y.o. [name] Primary Care Odile Veloso: Clydie Braun Other Clinician: Referring Lucien Budney: Treating Jalena Vanderlinden/Extender: Sydnee Cabal in Treatment: 6 Fall Risk Assessment Items Have you had 2 or more falls in the last 12 monthso 0 No Have you had any fall that resulted in injury in the last 12 monthso 0 No FALLS RISK SCREEN History of falling - immediate or within 3 months 25 Yes Secondary diagnosis (Do you have 2 or more medical diagnoseso) 0 No Ambulatory aid None/bed rest/wheelchair/nurse 0 Yes Crutches/cane/walker 0 No Furniture 0 No Intravenous therapy Access/Saline/Heparin Lock 0 No Gait/Transferring Normal/ bed rest/ wheelchair 0 Yes Weak (short steps with or without shuffle, stooped but able to lift head while walking, may seek 0 No support from furniture) Impaired (short steps with shuffle, may have difficulty arising from chair, head down, impaired 0 No balance) Mental Status Oriented to own ability 0 Yes Electronic Signature(s) Signed: 09/19/2022 4:19:27 PM By: Angelina Pih Entered By: Angelina Pih on 09/19/2022 12:45:53

## 2022-09-21 ENCOUNTER — Ambulatory Visit: Payer: HMO | Attending: Orthopedic Surgery | Admitting: Occupational Therapy

## 2022-09-21 DIAGNOSIS — R278 Other lack of coordination: Secondary | ICD-10-CM | POA: Diagnosis not present

## 2022-09-21 DIAGNOSIS — R600 Localized edema: Secondary | ICD-10-CM

## 2022-09-21 DIAGNOSIS — M6281 Muscle weakness (generalized): Secondary | ICD-10-CM | POA: Insufficient documentation

## 2022-09-21 DIAGNOSIS — R208 Other disturbances of skin sensation: Secondary | ICD-10-CM | POA: Diagnosis not present

## 2022-09-21 NOTE — Therapy (Signed)
[facility] [address], [place], [address] Phone: [phone]   Fax:  [phone]  Occupational Therapy Treatment  Patient Details  Name: JOHNNAE IMPASTATO MRN: [id number] Date of Birth: [birth year] No data recorded  Encounter Date: 09/21/2022   OT End of Session - 09/21/22 1309     Visit Number 3    Number of Visits 13    Date for OT Re-Evaluation 10/25/22    OT Start Time 1032    OT Stop Time 1127    OT Time Calculation (min) 55 min    Activity Tolerance Patient tolerated treatment well    Behavior During Therapy [facility] for tasks assessed/performed             Past Medical History:  Diagnosis Date   Arthritis    GERD (gastroesophageal reflux disease)    Hx of basal cell carcinoma 01/13/2013   left parasternal chest   Hx of basal cell carcinoma 04/26/2015   right sacral area   Hx of basal cell carcinoma 04/26/2016   left temple   Hypertension     Past Surgical History:  Procedure Laterality Date   ABDOMINAL HYSTERECTOMY     APPENDECTOMY     BACK SURGERY     COLONOSCOPY WITH PROPOFOL N/A 03/07/2017   Procedure: COLONOSCOPY WITH PROPOFOL;  Surgeon: Toledo, Boykin Nearing, MD;  Location: [facility] ENDOSCOPY;  Service: Gastroenterology;  Laterality: N/A;   ESOPHAGOGASTRODUODENOSCOPY N/A 03/07/2017   Procedure: ESOPHAGOGASTRODUODENOSCOPY (EGD);  Surgeon: Toledo, Boykin Nearing, MD;  Location: [facility] ENDOSCOPY;  Service: Gastroenterology;  Laterality: N/A;   FOOT SURGERY Left 05/2016   JOINT REPLACEMENT Bilateral 2010 2011   REPLACEMENT TOTAL KNEE BILATERAL  2010    There were no vitals filed for this visit.   Subjective Assessment - 09/21/22 1307     Subjective  I am so happy - look my pinkie is going straight and the middle and ring too- Index still bend more. But I could tell difference just in this past week    Pertinent History Per chart, recent MD note:  Ms. Lalley returns today for re-evaluation of her left  upper extremity. She is a 87 y.o. female who I last saw in 07/04/2022. Patient is status post left rTSA with a block with Dr. Olga Millers on 09/23/2021. She states that in PACU she noticed numbness in the radial nerve distribution and was unable to extend her wrist. Since that time she has regained wrist extension. She has not noted improvement in the numbness in the radial nerve distribution. She has had multiple EMG studies which have shown electrodiagnostic evidence of recovery of the radial nerve proper. She returns today for reevaluation. She notes that she has had some new onset swelling at the dorsal aspect of her left hand. She is not sure why this happened. She has not seen any improvement in terms of her finger or thumb function. Examination of the left upper extremity shows she has normal sensation in ulnar and median nerve distributions. She has diminished sensation and pressure perception to light touch in left radial nerve distribution. Skin is intact. Fingers are warm and well perfused with brisk capillary refill and she has a palpable radial pulse. There is no tenderness to palpation throughout. 5/5 triceps. 5/5 brachioradialis. 5/5 left wrist extension through her radial wrist extenders, 0/5 EDC, 0/5 EPL. 0/5 EIP    Patient Stated Goals Pt would like to improve movement and strength of her left hand to complete  daily tasks with greater ease and independence.    Currently in Pain? No/denies                  Grip increase to 29 lbs , Lat pinch increase to 7lbs  3 point pinch increase to 5 1/2 lbs         OT Treatments/Exercises (OP) - 09/21/22 0001       Moist Heat Therapy   Number Minutes Moist Heat 5 Minutes    Moist Heat Location Hand;Wrist   prior to soft tissue and ROM           Pt to cont  hand base splint for MC extention and thumb - r Reinforce with pt weight bearing inbetween exercises to stretch flexors or prayer stretch Facilitate digits extention rolling over  red roller -2 x 10 reps- relax into hyper ext stretch REINFORCE And done over large cylinder object rest and extention of digits - unable to do digits exention if wrist in extention  Or sliding off over table edge - maintaining extention of digits - MC extention - 3 x 5 reps Or place and hold exention of digits after lumbrical fist - 2x 10 reps Place and hold rubber band for digits extention - partial AROM  WB in between  Thumb PA and RA - place and hold using rubber band -2x 10 reps OA of thumb CMC  And pt has some CTS  PA and RA mostly out of MC   Opposition pt to pick up 1 cm foam block -but focus on opening and extention of digits  Much more ease with 3rd thru 5th digits   WB inbetween or prayer stretch MC's extention this date -30 to -45 place and hold After WB - and over edge of table - 25 to -35 t- 2nd digit -45 but enlarge MC joint  Thumb PA 50 and RA 40 degrees  HEP review and hand out provided           OT Education - 09/21/22 1308     Education Details HEP    Person(s) Educated Patient    Methods Explanation;Demonstration;Tactile cues;Verbal cues    Comprehension Verbalized understanding              OT Short Term Goals - 09/13/22 1620       OT SHORT TERM GOAL #1   Title Pt will be independent with HEP for left wrist and hand    Baseline Eval:  continues to perform exercises from prior OT tx, will need updated HEP.    Time 3    Period Weeks    Status New    Target Date 10/02/22               OT Long Term Goals - 09/13/22 1620       OT LONG TERM GOAL #1   Title Pt will demonstrate improve MP extension by 10 degrees to facilitate greater active reach for objects    Baseline Eval:  Limited MCP extension    Time 6    Period Weeks    Status New    Target Date 10/25/22      OT LONG TERM GOAL #2   Title Pt will demonstrate improvement of grip by 10# to grasp items without dropping.    Baseline Eval:  left grip limited to 24#, difficulty  maintaining grip on objects    Time 6    Period Weeks    Target Date 10/25/22  OT LONG TERM GOAL #3   Title Pt will demonstrate improved functional  strength in wrist and hand to operate manual can opener without difficulty.    Baseline Eval:  difficulty with using manual can opener    Time 6    Period Weeks    Status New    Target Date 10/25/22      OT LONG TERM GOAL #4   Title Pt will demonstrate improved hand function and coordination to engage in meal preparation tasks with modified independence.    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  She reports afterward she had numbness along the left radial nerve distribution and decreased hand and wrist motion.  She was seen for Occupational Therapy in [facility] for a limited number of visits and has not had any OT in the last 3 months.  She reports recent increase in edema in the left dorsum of her hand and continued limited motion.  She had a recent EMG study which indicated healing over time.  . She presents with limited active motion in her left hand especially for finger extension at [facility] and thumb PA and RA, decreased strength overall but also decreased grip and pinch strengths with comparison. Pt arrive this date after doing HEP the last week with increase exention of 5th thru 3rd MC , thumb RA and increase grip and prehension strength  She acknowledges improvement since onset but feels her progress is slow and wants to see improvements in her left hand function to perform daily tasks with greater ease and independence.  Pt would benefit from skilled OT services  to maximize her safety and independence with ADL and IADL tasks at home and in the community.    OT Occupational Profile and History Detailed Assessment- Review of Records and additional review of physical, cognitive, psychosocial history related to current functional performance    Occupational performance deficits (Please refer to evaluation for details): ADL's;IADL's;Leisure    Body Structure / Function / Physical Skills ADL;ROM;UE functional use;FMC;Dexterity;Sensation;Edema;Strength;Coordination;IADL    Psychosocial Skills Habits;Routines and Behaviors;Environmental  Adaptations    Rehab Potential Good    Clinical Decision Making Several treatment options, min-mod task modification necessary    Comorbidities Affecting Occupational Performance: May have comorbidities impacting occupational performance    Modification or Assistance to Complete Evaluation  No modification of tasks or assist necessary to complete eval    OT Frequency 1x / week    OT Duration 6 weeks    OT Treatment/Interventions Self-care/ADL training;Electrical Stimulation;Therapeutic exercise;Coping strategies training;Moist Heat;Paraffin;Neuromuscular education;Splinting;Patient/family education;Fluidtherapy;Therapeutic activities;Cryotherapy;Ultrasound;Contrast Bath;DME and/or AE instruction;Manual Therapy;Passive range of motion    Consulted and Agree with Plan of Care Patient             Patient will benefit from skilled therapeutic intervention in order to improve the following deficits and impairments:   Body Structure / Function / Physical Skills: ADL, ROM, UE functional use, FMC, Dexterity, Sensation, Edema, Strength, Coordination, IADL  Psychosocial Skills: Habits, Routines and Behaviors, Environmental  Adaptations   Visit Diagnosis: Other lack of coordination  Muscle weakness (generalized)  Localized edema  Other disturbances of skin sensation    Problem List Patient Active Problem List    Diagnosis Date Noted   CVA (cerebral infarction) 11/28/2015    Oletta Cohn, OTR/L,CLT 09/21/2022, 1:12 PM  [facility] [address], [place], [address] Phone: [phone]   Fax:  [phone]  Name: PAULLETTE MCKAIN MRN: [id number] Date of Birth: 09-11-1935

## 2022-09-22 ENCOUNTER — Ambulatory Visit
Admission: RE | Admit: 2022-09-22 | Discharge: 2022-09-22 | Disposition: A | Discharge status: Home / Self Care | Payer: HMO | Source: Ambulatory Visit | Attending: Infectious Diseases | Admitting: Infectious Diseases

## 2022-09-22 DIAGNOSIS — Z1231 Encounter for screening mammogram for malignant neoplasm of breast: Secondary | ICD-10-CM | POA: Diagnosis not present

## 2022-09-26 ENCOUNTER — Encounter: Payer: HMO | Attending: Physician Assistant | Admitting: Physician Assistant

## 2022-09-26 DIAGNOSIS — Z96612 Presence of left artificial shoulder joint: Secondary | ICD-10-CM | POA: Diagnosis not present

## 2022-09-26 DIAGNOSIS — G5632 Lesion of radial nerve, left upper limb: Secondary | ICD-10-CM | POA: Diagnosis not present

## 2022-09-26 DIAGNOSIS — I739 Peripheral vascular disease, unspecified: Secondary | ICD-10-CM | POA: Insufficient documentation

## 2022-09-26 DIAGNOSIS — L97812 Non-pressure chronic ulcer of other part of right lower leg with fat layer exposed: Secondary | ICD-10-CM | POA: Diagnosis not present

## 2022-09-26 DIAGNOSIS — I10 Essential (primary) hypertension: Secondary | ICD-10-CM | POA: Diagnosis not present

## 2022-09-26 DIAGNOSIS — S81811A Laceration without foreign body, right lower leg, initial encounter: Secondary | ICD-10-CM | POA: Insufficient documentation

## 2022-09-26 DIAGNOSIS — X58XXXA Exposure to other specified factors, initial encounter: Secondary | ICD-10-CM | POA: Insufficient documentation

## 2022-09-26 NOTE — Progress Notes (Signed)
WILLIAMS, PIZZI R ([id number]) [id number] Page 1 of 6 Visit Report for 09/26/2022 Chief Complaint Document Details Patient Name: Date of Service: [name]. 09/26/2022 2:00 PM Medical Record Number: [id number] Patient Account Number: [id number] Date of Birth/Sex: Treating RN: 02-Nov-1935 (87 y.o. [name] Primary Care Provider: Clydie Braun Other Clinician: Referring Provider: Treating Provider/Extender: Sydnee Cabal in Treatment: 7 Information Obtained from: Patient Chief Complaint Skin tear right leg 07/24/22 Electronic Signature(s) Signed: 09/26/2022 2:03:44 PM By: Allen Derry PA-C Entered By: Allen Derry on 09/26/2022 14:03:44 -------------------------------------------------------------------------------- HPI Details Patient Name: Date of Service: [name] 09/26/2022 2:00 PM Medical Record Number: [id number] Patient Account Number: [id number] Date of Birth/Sex: Treating RN: [birth year] (87 y.o. [name] Primary Care Provider: Clydie Braun Other Clinician: Referring Provider: Treating Provider/Extender: Sydnee Cabal in Treatment: 7 History of Present Illness HPI Description: 08-08-2022 upon evaluation today patient appears for initial inspection here in our clinic concerning issues that she has been having with a wound on her right anterior lower leg where she dropped a shredder on her leg causing a skin tear. Unfortunately this was attempted to be reapproximated but that has not been extremely effective for her. Fortunately there does not appear to be any signs of infection systemically which is great news. With that being said there is local infection definitely noted at this point. She has been on antibiotics. Currently cefdinir and doxycycline where the 2 antibiotics that were used up to this point. This original injury occurred on March 4. Her culture showed  Enterococcus faecium and that was on 08-06-2022 that was resulted. Her primary care provider Dr. Sampson Goon who is also an infectious disease specialist that stated that if she was not better at the end of her current cefdinir and doxycycline regimen that he would likely put her on Augmentin or amoxicillin. I think we may want to switch her to Augmentin at this point. Patient has a history of hypertension and peripheral vascular disease but really no other major medical problems at this point. With that being said although she did not show any signs of infection at this point I feel like this is something that might heal readily. However because of the screen showing PAD here in the clinic on the ABIs if she does not show signs of improvement over the next that I think we may want to go ahead and see about getting her to vascular. Further evaluation and treatment. Will however see how things do over the next week. 08-15-2022 upon evaluation today patient appears to be doing well currently in regard to her wound. She has been tolerating the dressing changes and overall seems to be doing quite well with regard to her wound I am seeing some definite improvement here. With that being said I do believe that she is making good progress with regard to the Iodoflex which I think is cleaning up the surface of the wound quite well I am pleased in that regard. She has started on the Augmentin currently and seems to be doing very well with that. 08-22-2022 upon evaluation today patient appears to be doing well currently in regard to her wound. She is actually tolerating the dressing changes without complication. Fortunately there does not appear to be any signs of active infection at this time which is great news and overall I do believe that she is significantly improved with the Augmentin which is excellent as well. 08-29-2022 upon evaluation today  patient appears to be doing well currently in regard to her wound  which is actually making some pretty good progress here. I am extremely pleased with where we stand I think she is headed in the right direction and in general I do believe that we are making leaps and bounds towards getting this completely closed. With that being said I do think it is time to go ahead and switch over to collagen in place of the Iodoflex I think you will feel more comfortable and I think it will help with additional tissue growth much more effectively at this point. 09-05-2022 upon evaluation today patient appears to be doing well currently in regard to her wound which is actually showing signs of excellent improvement. Fortunately I do not see any evidence of active infection locally nor systemically at this time which is great news. 09-12-2022 upon evaluation today patient appears to be doing well currently in regard to her wound. She has been tolerating the dressing changes without complication and very pleased in that regard. Fortunately there does not appear to be any signs of active infection locally nor systemically which is great news. No fevers, chills, nausea, vomiting, or diarrhea. 09-19-2022 upon evaluation today patient appears to be doing well currently in regard to her wound is actually was measuring smaller, looking better, and overall seems to be an excellent track towards complete closure. Overall I do believe that she is making good progress here. 09-26-2022 upon evaluation today patient appears to be doing well currently in regard to her wound. She has been tolerating the dressing changes for the last week without any complication. Fortunately there does not appear to be any signs of active infection at this time. I do feel like she is making excellent progress here. LAWRIE, PENFOLD R ([id number]) [id number] Page 2 of 6 Electronic Signature(s) Signed: 09/26/2022 2:38:12 PM By: Allen Derry PA-C Previous Signature: 09/26/2022 2:05:13 PM Version  By: Allen Derry PA-C Entered By: Allen Derry on 09/26/2022 14:38:12 -------------------------------------------------------------------------------- Physical Exam Details Patient Name: Date of Service: [name] 09/26/2022 2:00 PM Medical Record Number: [id number] Patient Account Number: [id number] Date of Birth/Sex: Treating RN: 1936-05-06 (87 y.o. [name] Primary Care Provider: Clydie Braun Other Clinician: Referring Provider: Treating Provider/Extender: Sydnee Cabal in Treatment: 7 Constitutional Well-nourished and well-hydrated in no acute distress. Respiratory normal breathing without difficulty. Psychiatric this patient is able to make decisions and demonstrates good insight into disease process. Alert and Oriented x 3. pleasant and cooperative. Notes Upon inspection patient's wound bed actually showed signs of good granulation epithelization at this point. Fortunately I do not see any signs of active infection locally or systemically which is great news and overall I am extremely pleased with where we are currently. Electronic Signature(s) Signed: 09/26/2022 2:38:26 PM By: Allen Derry PA-C Entered By: Allen Derry on 09/26/2022 14:38:26 -------------------------------------------------------------------------------- Physician Orders Details Patient Name: Date of Service: [name] 09/26/2022 2:00 PM Medical Record Number: [id number] Patient Account Number: [id number] Date of Birth/Sex: Treating RN: 06/05/1935 (87 y.o. [name] Primary Care Provider: Clydie Braun Other Clinician: Referring Provider: Treating Provider/Extender: Sydnee Cabal in Treatment: 7 Verbal / Phone Orders: No Diagnosis Coding ICD-10 Coding Code Description [phone] Laceration without foreign body, right lower leg, initial encounter L97.812 Non-pressure chronic ulcer of other part of right lower leg with fat  layer exposed I73.89 Other specified peripheral vascular diseases I10 Essential (primary) hypertension Follow-up Appointments Return Appointment in  1 week. Bathing/ Shower/ Hygiene May shower; gently cleanse wound with antibacterial soap, rinse and pat dry prior to dressing wounds No tub bath. Anesthetic (Use 'Patient Medications' Section for Anesthetic Order Entry) Wound #1 Right Lower Leg Lidocaine applied to wound bed Edema Control - Lymphedema / Segmental Compressive Device / Other Tubigrip single layer applied. - size D Elevate, Exercise Daily and A void Standing for Long Periods of Time. Elevate legs to the level of the heart and pump ankles as often as possible Elevate leg(s) parallel to the floor when sitting. DO YOUR BEST to sleep in the bed at night. DO NOT sleep in your recliner. Long hours of sitting in a recliner leads to swelling of the legs JOLISHA, MANDRELL R ([id number]) [id number] Page 3 of 6 and/or potential wounds on your backside. Medications-Please add to medication list. ntibiotics - Continue antibiotics as prescribed P.O. A Wound Treatment Wound #1 - Lower Leg Wound Laterality: Right Cleanser: Soap and Water 3 x Per Week/30 Days Discharge Instructions: Gently cleanse wound with antibacterial soap, rinse and pat dry prior to dressing wounds Cleanser: Vashe 5.8 (oz) 3 x Per Week/30 Days Discharge Instructions: Use vashe 5.8 (oz) as directed Prim Dressing: Prisma 4.34 (in) (Generic) 3 x Per Week/30 Days ary Discharge Instructions: Moisten w/normal saline or sterile water; Cover wound as directed. Do not remove from wound bed. Secondary Dressing: (BORDER) Zetuvit Plus SILICONE BORDER Dressing 4x4 (in/in) (Generic) 3 x Per Week/30 Days Discharge Instructions: Please do not put silicone bordered dressings under wraps. Use non-bordered dressing only. Secured With: Tubigrip Size D, 3x10 (in/yd) 3 x Per Week/30 Days Discharge Instructions:  single layer Electronic Signature(s) Signed: 09/26/2022 4:02:31 PM By: Angelina Pih Signed: 09/26/2022 5:36:28 PM By: Allen Derry PA-C Entered By: Angelina Pih on 09/26/2022 14:39:37 -------------------------------------------------------------------------------- Problem List Details Patient Name: Date of Service: Lavell Anchors RA R. 09/26/2022 2:00 PM Medical Record Number: [id number] Patient Account Number: [id number] Date of Birth/Sex: Treating RN: 1936/05/02 (87 y.o. [name] Primary Care Provider: Clydie Braun Other Clinician: Referring Provider: Treating Provider/Extender: Sydnee Cabal in Treatment: 7 Active Problems ICD-10 Encounter Code Description Active Date MDM Diagnosis S81.811A Laceration without foreign body, right lower leg, initial encounter 08/08/2022 No Yes L97.812 Non-pressure chronic ulcer of other part of right lower leg with fat layer 08/08/2022 No Yes exposed I73.89 Other specified peripheral vascular diseases 08/08/2022 No Yes I10 Essential (primary) hypertension 08/08/2022 No Yes Inactive Problems Resolved Problems Electronic Signature(s) Signed: 09/26/2022 2:03:42 PM By: Allen Derry PA-C Entered By: Allen Derry on 09/26/2022 14:03:42 Kaleyah Boys R ([id number]) [id number] Page 4 of 6 -------------------------------------------------------------------------------- Progress Note Details Patient Name: Date of Service: [name]. 09/26/2022 2:00 PM Medical Record Number: [id number] Patient Account Number: [id number] Date of Birth/Sex: Treating RN: 07-03-1935 (88 y.o. [name] Primary Care Provider: Clydie Braun Other Clinician: Referring Provider: Treating Provider/Extender: Sydnee Cabal in Treatment: 7 Subjective Chief Complaint Information obtained from Patient Skin tear right leg 07/24/22 History of Present Illness (HPI) 08-08-2022 upon  evaluation today patient appears for initial inspection here in our clinic concerning issues that she has been having with a wound on her right anterior lower leg where she dropped a shredder on her leg causing a skin tear. Unfortunately this was attempted to be reapproximated but that has not been extremely effective for her. Fortunately there does not appear to be any signs of infection systemically which is great news. With that being said there is  local infection definitely noted at this point. She has been on antibiotics. Currently cefdinir and doxycycline where the 2 antibiotics that were used up to this point. This original injury occurred on March 4. Her culture showed Enterococcus faecium and that was on 08-06-2022 that was resulted. Her primary care provider Dr. Sampson Goon who is also an infectious disease specialist that stated that if she was not better at the end of her current cefdinir and doxycycline regimen that he would likely put her on Augmentin or amoxicillin. I think we may want to switch her to Augmentin at this point. Patient has a history of hypertension and peripheral vascular disease but really no other major medical problems at this point. With that being said although she did not show any signs of infection at this point I feel like this is something that might heal readily. However because of the screen showing PAD here in the clinic on the ABIs if she does not show signs of improvement over the next that I think we may want to go ahead and see about getting her to vascular. Further evaluation and treatment. Will however see how things do over the next week. 08-15-2022 upon evaluation today patient appears to be doing well currently in regard to her wound. She has been tolerating the dressing changes and overall seems to be doing quite well with regard to her wound I am seeing some definite improvement here. With that being said I do believe that she is making good progress  with regard to the Iodoflex which I think is cleaning up the surface of the wound quite well I am pleased in that regard. She has started on the Augmentin currently and seems to be doing very well with that. 08-22-2022 upon evaluation today patient appears to be doing well currently in regard to her wound. She is actually tolerating the dressing changes without complication. Fortunately there does not appear to be any signs of active infection at this time which is great news and overall I do believe that she is significantly improved with the Augmentin which is excellent as well. 08-29-2022 upon evaluation today patient appears to be doing well currently in regard to her wound which is actually making some pretty good progress here. I am extremely pleased with where we stand I think she is headed in the right direction and in general I do believe that we are making leaps and bounds towards getting this completely closed. With that being said I do think it is time to go ahead and switch over to collagen in place of the Iodoflex I think you will feel more comfortable and I think it will help with additional tissue growth much more effectively at this point. 09-05-2022 upon evaluation today patient appears to be doing well currently in regard to her wound which is actually showing signs of excellent improvement. Fortunately I do not see any evidence of active infection locally nor systemically at this time which is great news. 09-12-2022 upon evaluation today patient appears to be doing well currently in regard to her wound. She has been tolerating the dressing changes without complication and very pleased in that regard. Fortunately there does not appear to be any signs of active infection locally nor systemically which is great news. No fevers, chills, nausea, vomiting, or diarrhea. 09-19-2022 upon evaluation today patient appears to be doing well currently in regard to her wound is actually was measuring  smaller, looking better, and overall seems to be an excellent track  towards complete closure. Overall I do believe that she is making good progress here. 09-26-2022 upon evaluation today patient appears to be doing well currently in regard to her wound. She has been tolerating the dressing changes for the last week without any complication. Fortunately there does not appear to be any signs of active infection at this time. I do feel like she is making excellent progress here. Objective Constitutional Well-nourished and well-hydrated in no acute distress. Vitals Time Taken: 2:05 PM, Height: 69 in, Weight: 194 lbs, BMI: 28.6, Temperature: 98 F, Pulse: 75 bpm, Respiratory Rate: 18 breaths/min, Blood Pressure: 163/75 mmHg. Respiratory normal breathing without difficulty. Psychiatric this patient is able to make decisions and demonstrates good insight into disease process. Alert and Oriented x 3. pleasant and cooperative. General Notes: Upon inspection patient's wound bed actually showed signs of good granulation epithelization at this point. Fortunately I do not see any signs of active infection locally or systemically which is great news and overall I am extremely pleased with where we are currently. Integumentary (Hair, Skin) MIKAELLA, POULIOT R ([id number]) [id number] Page 5 of 6 Wound #1 status is Open. Original cause of wound was Trauma. The date acquired was: 07/24/2022. The wound has been in treatment 7 weeks. The wound is located on the Right Lower Leg. The wound measures 2.2cm length x 2.1cm width x 0.2cm depth; 3.629cm^2 area and 0.726cm^3 volume. There is Fat Layer (Subcutaneous Tissue) exposed. There is no tunneling or undermining noted. There is a medium amount of serosanguineous drainage noted. There is large (67- 100%) red, pink granulation within the wound bed. There is a small (1-33%) amount of necrotic tissue within the wound bed including Adherent  Slough. Assessment Active Problems ICD-10 Laceration without foreign body, right lower leg, initial encounter Non-pressure chronic ulcer of other part of right lower leg with fat layer exposed Other specified peripheral vascular diseases Essential (primary) hypertension Plan 1. I would recommend that we have the patient continue to monitor for any signs of infection or worsening. Based on what I am seeing I do think that she is doing well with the collagen regular continue as such. 2. I am also going to recommend the patient should continue with the bordered foam dressing to cover which is doing a great job. We will see patient back for reevaluation in 1 week here in the clinic. If anything worsens or changes patient will contact our office for additional recommendations. Electronic Signature(s) Signed: 09/26/2022 2:38:46 PM By: Allen Derry PA-C Entered By: Allen Derry on 09/26/2022 14:38:45 -------------------------------------------------------------------------------- SuperBill Details Patient Name: Date of Service: [name] 09/26/2022 Medical Record Number: [id number] Patient Account Number: [id number] Date of Birth/Sex: Treating RN: 1936/04/13 (87 y.o. [name] Primary Care Provider: Clydie Braun Other Clinician: Referring Provider: Treating Provider/Extender: Sydnee Cabal in Treatment: 7 Diagnosis Coding ICD-10 Codes Code Description [phone] Laceration without foreign body, right lower leg, initial encounter L97.812 Non-pressure chronic ulcer of other part of right lower leg with fat layer exposed I73.89 Other specified peripheral vascular diseases I10 Essential (primary) hypertension Facility Procedures : CPT4 Code: [id number] Description: 99213 - WOUND CARE VISIT-LEV 3 EST PT Modifier: Quantity: 1 Physician Procedures : CPT4 Code Description Modifier [id number] 99213 - WC PHYS LEVEL 3 - EST PT ICD-10 Diagnosis Description  S81.811A Laceration without foreign body, right lower leg, initial encounter L97.812 Non-pressure chronic ulcer of other part of right lower leg with  fat layer exposed I73.89 Other specified peripheral vascular diseases I10  Essential (primary) hypertension Quantity: [address] HAILA, HINIKER R ([id number]) [id number] Page 6 of 6 Signed: 09/26/2022 4:02:31 PM By: Angelina Pih Signed: 09/26/2022 5:36:28 PM By: Allen Derry PA-C Previous Signature: 09/26/2022 2:39:03 PM Version By: Allen Derry PA-C Entered By: Angelina Pih on 09/26/2022 14:40:07

## 2022-09-26 NOTE — Progress Notes (Addendum)
[name] ([id number]) [id number] Page 1 of 7 Visit Report for 09/26/2022 Arrival Information Details Patient Name: Date of Service: Brooke Anchors [name]. 09/26/2022 2:00 PM Medical Record Number: [id number] Patient Account Number: [id number] Date of Birth/Sex: Treating RN: 1936-03-06 (87 y.o. [name] Primary Care Iracema Lanagan: Clydie Braun Other Clinician: Referring Maurisa Tesmer: Treating Jammie Clink/Extender: Sydnee Cabal in Treatment: 7 Visit Information History Since Last Visit Added or deleted any medications: No Patient Arrived: Ambulatory Any new allergies or adverse reactions: No Arrival Time: 14:03 Had a fall or experienced change in No Accompanied By: self activities of daily living that may affect Transfer Assistance: None risk of falls: Patient Identification Verified: Yes Hospitalized since last visit: No Secondary Verification Process Completed: Yes Has Dressing in Place as Prescribed: Yes Patient Has Alerts: Yes Has Compression in Place as Prescribed: Yes Patient Alerts: Patient on Blood Thinner Pain Present Now: No 08/08/22 ABI Right PAD 08/08/22 ABI Left PAD Electronic Signature(s) Signed: 09/26/2022 4:02:31 PM By: Angelina Pih Entered By: Angelina Pih on 09/26/2022 14:05:15 -------------------------------------------------------------------------------- Clinic Level of Care Assessment Details Patient Name: Date of Service: [name] 09/26/2022 2:00 PM Medical Record Number: [id number] Patient Account Number: [id number] Date of Birth/Sex: Treating RN: Oct 04, 1935 (87 y.o. [name] Primary Care Cheikh Bramble: Clydie Braun Other Clinician: Referring Liban Guedes: Treating Milanie Rosenfield/Extender: Sydnee Cabal in Treatment: 7 Clinic Level of Care Assessment Items TOOL 4 Quantity Score []  - 0 Use when only an EandM is performed on FOLLOW-UP visit ASSESSMENTS -  Nursing Assessment / Reassessment X- 1 10 Reassessment of Co-morbidities (includes updates in patient status) X- 1 5 Reassessment of Adherence to Treatment Plan ASSESSMENTS - Wound and Skin A ssessment / Reassessment X - Simple Wound Assessment / Reassessment - one wound 1 5 []  - 0 Complex Wound Assessment / Reassessment - multiple wounds []  - 0 Dermatologic / Skin Assessment (not related to wound area) ASSESSMENTS - Focused Assessment []  - 0 Circumferential Edema Measurements - multi extremities []  - 0 Nutritional Assessment / Counseling / Intervention []  - 0 Lower Extremity Assessment (monofilament, tuning fork, pulses) []  - 0 Peripheral Arterial Disease Assessment (using hand held doppler) ASSESSMENTS - Ostomy and/or Continence Assessment and Care []  - 0 Incontinence Assessment and Management []  - 0 Ostomy Care Assessment and Management (repouching, etc.) PROCESS - Coordination of Care X - Simple Patient / Family Education for ongoing care 1 15 []  - 0 Complex (extensive) Patient / Family Education for ongoing care DONNESHIA, SALSER [name] ([id number]) [id number] Page 2 of 7 X- 1 10 Staff obtains Consents, Records, T Results / Process Orders est []  - 0 Staff telephones HHA, Nursing Homes / Clarify orders / etc []  - 0 Routine Transfer to another Facility (non-emergent condition) []  - 0 Routine Hospital Admission (non-emergent condition) []  - 0 New Admissions / Manufacturing engineer / Ordering NPWT Apligraf, etc. , []  - 0 Emergency Hospital Admission (emergent condition) X- 1 10 Simple Discharge Coordination []  - 0 Complex (extensive) Discharge Coordination PROCESS - Special Needs []  - 0 Pediatric / Minor Patient Management []  - 0 Isolation Patient Management []  - 0 Hearing / Language / Visual special needs []  - 0 Assessment of Community assistance (transportation, D/C planning, etc.) []  - 0 Additional assistance / Altered mentation []   - 0 Support Surface(s) Assessment (bed, cushion, seat, etc.) INTERVENTIONS - Wound Cleansing / Measurement X - Simple Wound Cleansing - one wound 1 5 []  - 0 Complex Wound Cleansing -  multiple wounds X- 1 5 Wound Imaging (photographs - any number of wounds) []  - 0 Wound Tracing (instead of photographs) X- 1 5 Simple Wound Measurement - one wound []  - 0 Complex Wound Measurement - multiple wounds INTERVENTIONS - Wound Dressings X - Small Wound Dressing one or multiple wounds 1 10 []  - 0 Medium Wound Dressing one or multiple wounds []  - 0 Large Wound Dressing one or multiple wounds X- 1 5 Application of Medications - topical []  - 0 Application of Medications - injection INTERVENTIONS - Miscellaneous []  - 0 External ear exam []  - 0 Specimen Collection (cultures, biopsies, blood, body fluids, etc.) []  - 0 Specimen(s) / Culture(s) sent or taken to Lab for analysis []  - 0 Patient Transfer (multiple staff / Nurse, adult / Similar devices) []  - 0 Simple Staple / Suture removal (25 or less) []  - 0 Complex Staple / Suture removal (26 or more) []  - 0 Hypo / Hyperglycemic Management (close monitor of Blood Glucose) []  - 0 Ankle / Brachial Index (ABI) - do not check if billed separately X- 1 5 Vital Signs Has the patient been seen at the hospital within the last three years: Yes Total Score: 90 Level Of Care: New/Established - Level 3 Electronic Signature(s) Signed: 09/26/2022 4:02:31 PM By: Angelina Pih Entered By: Angelina Pih on 09/26/2022 14:39:59 Lakeisha Boys [name] ([id number]) [id number] Page 3 of 7 -------------------------------------------------------------------------------- Encounter Discharge Information Details Patient Name: Date of Service: [name]. 09/26/2022 2:00 PM Medical Record Number: [id number] Patient Account Number: [id number] Date of Birth/Sex: Treating RN: Sep 22, 1935 (87 y.o. [name] Primary Care  Gerhard Rappaport: Clydie Braun Other Clinician: Referring Grove Defina: Treating Cullen Lahaie/Extender: Sydnee Cabal in Treatment: 7 Encounter Discharge Information Items Discharge Condition: Stable Ambulatory Status: Ambulatory Discharge Destination: Home Transportation: Private Auto Accompanied By: self Schedule Follow-up Appointment: Yes Clinical Summary of Care: Electronic Signature(s) Signed: 09/26/2022 4:02:31 PM By: Angelina Pih Entered By: Angelina Pih on 09/26/2022 14:40:56 -------------------------------------------------------------------------------- Lower Extremity Assessment Details Patient Name: Date of Service: [name]. 09/26/2022 2:00 PM Medical Record Number: [id number] Patient Account Number: [id number] Date of Birth/Sex: Treating RN: 08/28/1935 (87 y.o. [name] Primary Care Atisha Hamidi: Clydie Braun Other Clinician: Referring Abhiraj Dozal: Treating Kynsleigh Westendorf/Extender: Sydnee Cabal in Treatment: 7 Edema Assessment Assessed: [Left: No] [Right: No] Edema: [Left: Ye] [Right: s] Calf Left: Right: Point of Measurement: 34 cm From Medial Instep 40.8 cm Ankle Left: Right: Point of Measurement: 11 cm From Medial Instep 26.5 cm Vascular Assessment Pulses: Dorsalis Pedis Palpable: [Right:Yes] Posterior Tibial Palpable: [Right:Yes] Electronic Signature(s) Signed: 09/26/2022 4:02:31 PM By: Angelina Pih Entered By: Angelina Pih on 09/26/2022 14:13:59 -------------------------------------------------------------------------------- Multi Wound Chart Details Patient Name: Date of Service: Brooke Anchors [name]. 09/26/2022 2:00 PM Medical Record Number: [id number] Patient Account Number: [id number] Date of Birth/Sex: Treating RN: [birth year] (87 y.o. [name] Primary Care Shiven Junious: Clydie Braun Other Clinician: Referring Nimrat Woolworth: Treating Kahdijah Errickson/Extender: Sydnee Cabal in Treatment: [id number] [place] [name] ([id number]) [id number] Page 4 of 7 Vital Signs Height(in): 69 Pulse(bpm): 75 Weight(lbs): 194 Blood Pressure(mmHg): 163/75 Body Mass Index(BMI): 28.6 Temperature(F): 98 Respiratory Rate(breaths/min): 18 [1:Photos:] [N/A:N/A] Right Lower Leg N/A N/A Wound Location: Trauma N/A N/A Wounding Event: Skin Tear N/A N/A Primary Etiology: Anemia, Hypertension, Osteoarthritis N/A N/A Comorbid History: 07/24/2022 N/A N/A Date Acquired: 7 N/A N/A Weeks of Treatment: Open N/A N/A Wound Status: No N/A N/A Wound Recurrence: 2.2x2.1x0.2 N/A N/A Measurements L x  W x D (cm) 3.629 N/A N/A A (cm) : rea 0.726 N/A N/A Volume (cm) : 48.60% N/A N/A % Reduction in A rea: -2.80% N/A N/A % Reduction in Volume: Full Thickness Without Exposed N/A N/A Classification: Support Structures Medium N/A N/A Exudate A mount: Serosanguineous N/A N/A Exudate Type: red, brown N/A N/A Exudate Color: Large (67-100%) N/A N/A Granulation A mount: Red, Pink N/A N/A Granulation Quality: Small (1-33%) N/A N/A Necrotic A mount: Fat Layer (Subcutaneous Tissue): Yes N/A N/A Exposed Structures: None N/A N/A Epithelialization: Treatment Notes Electronic Signature(s) Signed: 09/26/2022 4:02:31 PM By: Angelina Pih Entered By: Angelina Pih on 09/26/2022 14:29:41 -------------------------------------------------------------------------------- Multi-Disciplinary Care Plan Details Patient Name: Date of Service: Lisette Grinder, Linwood Dibbles [name]. 09/26/2022 2:00 PM Medical Record Number: [id number] Patient Account Number: [id number] Date of Birth/Sex: Treating RN: 1935/08/18 (87 y.o. [name] Primary Care Shaelynn Dragos: Clydie Braun Other Clinician: Referring Wilhelmenia Addis: Treating Ausencio Vaden/Extender: Sydnee Cabal in Treatment: 7 Active Inactive Wound/Skin Impairment Nursing Diagnoses: Impaired tissue  integrity Knowledge deficit related to ulceration/compromised skin integrity Goals: Ulcer/skin breakdown will have a volume reduction of 30% by week 4 Date Initiated: 08/08/2022 Date Inactivated: 09/05/2022 Target Resolution Date: 09/05/2022 Unmet Reason: slow wound healing, is Goal Status: Unmet TAQUILLA, ECHEVERRY [name] ([id number]) [id number] Page 5 of 7 Goal Status: Unmet improving Ulcer/skin breakdown will have a volume reduction of 50% by week 8 Date Initiated: 08/08/2022 Target Resolution Date: 10/03/2022 Goal Status: Active Ulcer/skin breakdown will have a volume reduction of 80% by week 12 Date Initiated: 08/08/2022 Target Resolution Date: 10/31/2022 Goal Status: Active Ulcer/skin breakdown will heal within 14 weeks Date Initiated: 08/08/2022 Target Resolution Date: 11/14/2022 Goal Status: Active Interventions: Assess patient/caregiver ability to obtain necessary supplies Assess patient/caregiver ability to perform ulcer/skin care regimen upon admission and as needed Assess ulceration(s) every visit Provide education on ulcer and skin care Treatment Activities: Skin care regimen initiated : 08/08/2022 Notes: Electronic Signature(s) Signed: 09/26/2022 4:02:31 PM By: Angelina Pih Entered By: Angelina Pih on 09/26/2022 14:40:15 -------------------------------------------------------------------------------- Pain Assessment Details Patient Name: Date of Service: Brooke Anchors [name]. 09/26/2022 2:00 PM Medical Record Number: [id number] Patient Account Number: [id number] Date of Birth/Sex: Treating RN: 02/10/1936 (87 y.o. [name] Primary Care Sion Thane: Clydie Braun Other Clinician: Referring Rayaan Garguilo: Treating Paitynn Mikus/Extender: Sydnee Cabal in Treatment: 7 Active Problems Location of Pain Severity and Description of Pain Patient Has Paino No Site Locations Rate the pain. Current Pain Level: 0 Pain Management and  Medication Current Pain Management: Electronic Signature(s) Signed: 09/26/2022 4:02:31 PM By: Angelina Pih Entered By: Angelina Pih on 09/26/2022 14:06:13 Patient/Caregiver Education Details -------------------------------------------------------------------------------- [name] ([id number]) [id number] Page 6 of 7 Patient Name: Date of Service: [name] 5/7/2024andnbsp2:00 PM Medical Record Number: [id number] Patient Account Number: [id number] Date of Birth/Gender: Treating RN: 12-May-1936 (87 y.o. [name] Primary Care Physician: Clydie Braun Other Clinician: Referring Physician: Treating Physician/Extender: Sydnee Cabal in Treatment: 7 Education Assessment Education Provided To: Patient Education Topics Provided Wound/Skin Impairment: Handouts: Caring for Your Ulcer Methods: Explain/Verbal Responses: State content correctly Electronic Signature(s) Signed: 09/26/2022 4:02:31 PM By: Angelina Pih Entered By: Angelina Pih on 09/26/2022 14:40:25 -------------------------------------------------------------------------------- Wound Assessment Details Patient Name: Date of Service: Brooke Anchors [name]. 09/26/2022 2:00 PM Medical Record Number: [id number] Patient Account Number: [id number] Date of Birth/Sex: Treating RN: [birth year] (87 y.o. [name] Primary Care Alayshia Marini: Clydie Braun Other Clinician: Referring Loletta Harper: Treating Denita Lun/Extender: Sydnee Cabal  in Treatment: 7 Wound Status Wound Number: 1 Primary Etiology: Skin Tear Wound Location: Right Lower Leg Wound Status: Open Wounding Event: Trauma Comorbid History: Anemia, Hypertension, Osteoarthritis Date Acquired: 07/24/2022 Weeks Of Treatment: 7 Clustered Wound: No Photos Wound Measurements Length: (cm) 2.2 Width: (cm) 2.1 Depth: (cm) 0.2 Area: (cm) 3.629 Volume: (cm) 0.726 %  Reduction in Area: 48.6% % Reduction in Volume: -2.8% Epithelialization: None Tunneling: No Undermining: No Wound Description Classification: Full Thickness Without Exposed Suppor Exudate Amount: Medium Exudate Type: Serosanguineous Exudate Color: red, brown t Structures Foul Odor After Cleansing: No Slough/Fibrino Yes Wound Bed TIAWNA, GILLETT [name] ([id number]) [id number] Page 7 of 7 Granulation Amount: Large (67-100%) Exposed Structure Granulation Quality: Red, Pink Fat Layer (Subcutaneous Tissue) Exposed: Yes Necrotic Amount: Small (1-33%) Necrotic Quality: Adherent Slough Treatment Notes Wound #1 (Lower Leg) Wound Laterality: Right Cleanser Soap and Water Discharge Instruction: Gently cleanse wound with antibacterial soap, rinse and pat dry prior to dressing wounds Vashe 5.8 (oz) Discharge Instruction: Use vashe 5.8 (oz) as directed Peri-Wound Care Topical Primary Dressing Prisma 4.34 (in) Discharge Instruction: Moisten w/normal saline or sterile water; Cover wound as directed. Do not remove from wound bed. Secondary Dressing (BORDER) Zetuvit Plus SILICONE BORDER Dressing 4x4 (in/in) Discharge Instruction: Please do not put silicone bordered dressings under wraps. Use non-bordered dressing only. Secured With Tubigrip Size D, 3x10 (in/yd) Discharge Instruction: single layer Compression Wrap Compression Stockings Add-Ons Electronic Signature(s) Signed: 09/26/2022 4:02:31 PM By: Angelina Pih Entered By: Angelina Pih on 09/26/2022 14:13:34 -------------------------------------------------------------------------------- Vitals Details Patient Name: Date of Service: Lisette Grinder, Linwood Dibbles [name]. 09/26/2022 2:00 PM Medical Record Number: [id number] Patient Account Number: [id number] Date of Birth/Sex: Treating RN: 24-Jun-1935 (87 y.o. [name] Primary Care Tira Lafferty: Clydie Braun Other Clinician: Referring Lorrayne Ismael: Treating  Amonda Brillhart/Extender: Sydnee Cabal in Treatment: 7 Vital Signs Time Taken: 14:05 Temperature (F): 98 Height (in): 69 Pulse (bpm): 75 Weight (lbs): 194 Respiratory Rate (breaths/min): 18 Body Mass Index (BMI): 28.6 Blood Pressure (mmHg): 163/75 Reference Range: 80 - 120 mg / dl Electronic Signature(s) Signed: 09/26/2022 4:02:31 PM By: Angelina Pih Entered By: Angelina Pih on 09/26/2022 14:05:54

## 2022-09-29 ENCOUNTER — Ambulatory Visit: Payer: HMO | Admitting: Occupational Therapy

## 2022-09-29 DIAGNOSIS — R278 Other lack of coordination: Secondary | ICD-10-CM | POA: Diagnosis not present

## 2022-09-29 DIAGNOSIS — M6281 Muscle weakness (generalized): Secondary | ICD-10-CM

## 2022-09-29 DIAGNOSIS — R208 Other disturbances of skin sensation: Secondary | ICD-10-CM

## 2022-09-29 DIAGNOSIS — R600 Localized edema: Secondary | ICD-10-CM

## 2022-09-29 NOTE — Therapy (Signed)
[facility] [address], [place], [address] Phone: [phone]   Fax:  [phone]  Occupational Therapy Treatment  Patient Details  Name: AUBREANNA VANDERBUSH MRN: [id number] Date of Birth: January 30, 1936 No data recorded  Encounter Date: 09/29/2022   OT End of Session - 09/29/22 1119     Visit Number 4    Number of Visits 13    Date for OT Re-Evaluation 10/25/22    OT Start Time 0915    OT Stop Time 1020    OT Time Calculation (min) 65 min    Activity Tolerance Patient tolerated treatment well    Behavior During Therapy [facility] for tasks assessed/performed             Past Medical History:  Diagnosis Date   Arthritis    GERD (gastroesophageal reflux disease)    Hx of basal cell carcinoma 01/13/2013   left parasternal chest   Hx of basal cell carcinoma 04/26/2015   right sacral area   Hx of basal cell carcinoma 04/26/2016   left temple   Hypertension     Past Surgical History:  Procedure Laterality Date   ABDOMINAL HYSTERECTOMY     APPENDECTOMY     BACK SURGERY     COLONOSCOPY WITH PROPOFOL N/A 03/07/2017   Procedure: COLONOSCOPY WITH PROPOFOL;  Surgeon: Toledo, Boykin Nearing, MD;  Location: [facility] ENDOSCOPY;  Service: Gastroenterology;  Laterality: N/A;   ESOPHAGOGASTRODUODENOSCOPY N/A 03/07/2017   Procedure: ESOPHAGOGASTRODUODENOSCOPY (EGD);  Surgeon: Toledo, Boykin Nearing, MD;  Location: [facility] ENDOSCOPY;  Service: Gastroenterology;  Laterality: N/A;   FOOT SURGERY Left 05/2016   JOINT REPLACEMENT Bilateral 2010 2011   REPLACEMENT TOTAL KNEE BILATERAL  2010    There were no vitals filed for this visit.   Subjective Assessment - 09/29/22 1118     Subjective  I seen the surgeon since last time.  Total was very happy with my sister sessions with you and seeing progress.  I can pick up and release things now.  Just cannot extend my fingers all the way yet and little incoordinated.    Pertinent History Per chart,  recent MD note:  Ms. Sabel returns today for re-evaluation of her left upper extremity. She is a 87 y.o. female who I last saw in 07/04/2022. Patient is status post left rTSA with a block with Dr. Olga Millers on 09/23/2021. She states that in PACU she noticed numbness in the radial nerve distribution and was unable to extend her wrist. Since that time she has regained wrist extension. She has not noted improvement in the numbness in the radial nerve distribution. She has had multiple EMG studies which have shown electrodiagnostic evidence of recovery of the radial nerve proper. She returns today for reevaluation. She notes that she has had some new onset swelling at the dorsal aspect of her left hand. She is not sure why this happened. She has not seen any improvement in terms of her finger or thumb function. Examination of the left upper extremity shows she has normal sensation in ulnar and median nerve distributions. She has diminished sensation and pressure perception to light touch in left radial nerve distribution. Skin is intact. Fingers are warm and well perfused with brisk capillary refill and she has a palpable radial pulse. There is no tenderness to palpation throughout. 5/5 triceps. 5/5 brachioradialis. 5/5 left wrist extension through her radial wrist extenders, 0/5 EDC, 0/5 EPL. 0/5 EIP    Patient Stated Goals Pt would  like to improve movement and strength of her left hand to complete daily tasks with greater ease and independence.    Currently in Pain? No/denies                [facility] OT Assessment - 09/29/22 0001       Strength   Right Hand Grip (lbs) 40    Right Hand Lateral Pinch 7 lbs    Right Hand 3 Point Pinch 5 lbs    Left Hand Grip (lbs) 30    Left Hand Lateral Pinch 7 lbs    Left Hand 3 Point Pinch 5 lbs           Coming in [facility] extention this date -30 to -40 place and hold After WB and especially wall slides-hovering of table- 20 to -30 t 2nd digit -40 but enlarge MC  joint  Thumb PA 50 and RA 40 degrees   Pt to cont  hand base splint for MC extention and thumb Assessed patient using her left upper extremity and functional task.   Was able to push up from chair.   Had some difficulty pushing and pulling heavy door-had to use body weight.  But no pain.   Was able to carry 6 and 7 pounds with no discomfort.  As well as picking up putting in OT's hand.   8 pounds she has some discomfort at the elbow.   Upon assessment for patient's elbow wrist and digit extension on the wall. Patient done better with wall slides as a composite extension stretch prior to her fine motor and digit extension exercises Instead of Weightbearing through palm and chair Patient is to work on digit extension of flat surface after wall slides-hovering of table. Done very well after wall slides especially third and fourth.  Facilitate digits extention rolling over red roller -2 x 10 reps- relax into hyper ext stretch REINFORCE  Thumb PA and RA - place and hold using rubber band -2x 10 reps OA of thumb CMC  And pt has some CTS  PA and RA mostly out of MC   Add light blue putty for patient for lateral pinch with focusing on thumb radial abduction in between 10 reps  As well as alternating second and third digit opposition fourth 2-point pinch third and fourth together.  3 seconds pinch  With rolling putty in between for digit extension  At and reviewed with patient opposition thumb and fingers and fingers on thumb doing golf ball 5-8 reps And then thumb to second and third, thumb to fourth and fifth, 5 reps With wall slides for composite extension in between  Wall slides   HEP review and hand out provided                    OT Education - 09/29/22 1119     Education Details HEP    Person(s) Educated Patient    Methods Explanation;Demonstration;Tactile cues;Verbal cues    Comprehension Verbalized understanding              OT Short Term Goals - 09/13/22  1620       OT SHORT TERM GOAL #1   Title Pt will be independent with HEP for left wrist and hand    Baseline Eval:  continues to perform exercises from prior OT tx, will need updated HEP.    Time 3    Period Weeks    Status New    Target Date 10/02/22  OT Long Term Goals - 09/13/22 1620       OT LONG TERM GOAL #1   Title Pt will demonstrate improve MP extension by 10 degrees to facilitate greater active reach for objects    Baseline Eval:  Limited MCP extension    Time 6    Period Weeks    Status New    Target Date 10/25/22      OT LONG TERM GOAL #2   Title Pt will demonstrate improvement of grip by 10# to grasp items without dropping.    Baseline Eval:  left grip limited to 24#, difficulty maintaining grip on objects    Time 6    Period Weeks    Target Date 10/25/22      OT LONG TERM GOAL #3   Title Pt will demonstrate improved functional  strength in wrist and hand to operate manual can opener without difficulty.    Baseline Eval:  difficulty with using manual can opener    Time 6    Period Weeks    Status New    Target Date 10/25/22      OT LONG TERM GOAL #4   Title Pt will demonstrate improved hand function and coordination to engage in meal preparation tasks with modified independence.    Baseline Eval:  difficulty with kitchen tasks such as cracking an egg, holding pots    Time 6    Period Weeks    Status New    Target Date 10/25/22                   Plan - 09/29/22 1119     Clinical Impression Statement Pt is a 87 yo female who had a fall in Sept 2022 with a traumatic tear repair to her shoulder, she then underwent surgery on Sep 23, 2021 for a reverse shoulder 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  She reports afterward she had numbness along the left radial nerve distribution and decreased hand and wrist motion.  She was seen for Occupational Therapy in [facility] for a limited number of visits and has not had any OT in the last 3 months.  Her  last EMG study indicated healing over time.  . She presents  at eval with limited active motion in her left hand especially for finger extension at [facility] and thumb PA and RA, decreased strength overall but also decreased grip and pinch strengths with comparison. Pt cont to show increase  exention of 5th thru 3rd MC  more than 2nd , thumb RA  more than PA and increase grip strength and functional use. Pt  wants to see improvements in her left hand function to perform daily tasks with greater ease and independence.  Pt would benefit from skilled OT services to maximize her safety and independence with ADL and IADL tasks at home and in the community.    OT Occupational Profile and History Detailed Assessment- Review of Records and additional review of physical, cognitive, psychosocial history related to current functional performance    Occupational performance deficits (Please refer to evaluation for details): ADL's;IADL's;Leisure    Body Structure / Function / Physical Skills ADL;ROM;UE functional use;FMC;Dexterity;Sensation;Edema;Strength;Coordination;IADL    Psychosocial Skills Habits;Routines and Behaviors;Environmental  Adaptations    Rehab Potential Good    Clinical Decision Making Several treatment options, min-mod task modification necessary    Comorbidities Affecting Occupational Performance: May have comorbidities impacting occupational performance    Modification or Assistance to Complete Evaluation  No modification of tasks  or assist necessary to complete eval    OT Frequency 1x / week    OT Duration 6 weeks    OT Treatment/Interventions Self-care/ADL training;Electrical Stimulation;Therapeutic exercise;Coping strategies training;Moist Heat;Paraffin;Neuromuscular education;Splinting;Patient/family education;Fluidtherapy;Therapeutic activities;Cryotherapy;Ultrasound;Contrast Bath;DME and/or AE instruction;Manual Therapy;Passive range of motion    Consulted and Agree with Plan of Care Patient              Patient will benefit from skilled therapeutic intervention in order to improve the following deficits and impairments:   Body Structure / Function / Physical Skills: ADL, ROM, UE functional use, FMC, Dexterity, Sensation, Edema, Strength, Coordination, IADL   Psychosocial Skills: Habits, Routines and Behaviors, Environmental  Adaptations   Visit Diagnosis: Other lack of coordination  Muscle weakness (generalized)  Localized edema  Other disturbances of skin sensation    Problem List Patient Active Problem List   Diagnosis Date Noted   CVA (cerebral infarction) 11/28/2015    Oletta Cohn, OTR/L,CLT 09/29/2022, 11:24 AM  [facility] [address] [place], [address] Phone: [phone]   Fax:  [phone]  Name: CLEMMA MANDALA MRN: [id number] Date of Birth: December 28, 1935

## 2022-10-03 ENCOUNTER — Encounter: Payer: HMO | Admitting: Physician Assistant

## 2022-10-03 DIAGNOSIS — S81811A Laceration without foreign body, right lower leg, initial encounter: Secondary | ICD-10-CM | POA: Diagnosis not present

## 2022-10-03 DIAGNOSIS — L97812 Non-pressure chronic ulcer of other part of right lower leg with fat layer exposed: Secondary | ICD-10-CM | POA: Diagnosis not present

## 2022-10-03 NOTE — Progress Notes (Signed)
TANGALA, TIERRABLANCA ([id number]) [id number] Page 1 of 1 Visit Report for 10/03/2022 Chief Complaint Document Details Patient Name: Date of Service: [name]. 10/03/2022 7:30 A M Medical Record Number: [id number] Patient Account Number: [id number] Date of Birth/Sex: Treating RN: [birth year] (87 y.o. [name] Primary Care Provider: Clydie Braun Other Clinician: Referring Provider: Treating Provider/Extender: Sydnee Cabal in Treatment: 8 Information Obtained from: Patient Chief Complaint Skin tear right leg 07/24/22 Electronic Signature(s) Signed: 10/03/2022 8:07:21 AM By: Allen Derry PA-C Entered By: Allen Derry on 10/03/2022 08:07:21 -------------------------------------------------------------------------------- Problem List Details Patient Name: Date of Service: [name] 10/03/2022 7:30 A M Medical Record Number: [id number] Patient Account Number: [id number] Date of Birth/Sex: Treating RN: 24-Sep-1935 (87 y.o. 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

## 2022-10-04 DIAGNOSIS — L97812 Non-pressure chronic ulcer of other part of right lower leg with fat layer exposed: Secondary | ICD-10-CM | POA: Diagnosis not present

## 2022-10-05 ENCOUNTER — Ambulatory Visit: Payer: HMO

## 2022-10-05 ENCOUNTER — Ambulatory Visit: Payer: HMO | Admitting: Occupational Therapy

## 2022-10-05 DIAGNOSIS — R278 Other lack of coordination: Secondary | ICD-10-CM

## 2022-10-05 DIAGNOSIS — M6281 Muscle weakness (generalized): Secondary | ICD-10-CM

## 2022-10-05 DIAGNOSIS — R208 Other disturbances of skin sensation: Secondary | ICD-10-CM

## 2022-10-05 DIAGNOSIS — R600 Localized edema: Secondary | ICD-10-CM

## 2022-10-05 NOTE — Therapy (Signed)
[facility] [address], [place], [address] Phone: [phone]   Fax:  [phone]  Occupational Therapy Treatment  Patient Details  Name: [name] MRN: [id number] Date of Birth: 06/15/1935 No data recorded  Encounter Date: 10/05/2022   OT End of Session - 10/05/22 1007     Visit Number 5    Number of Visits 13    Date for OT Re-Evaluation 10/25/22    OT Start Time 0945    OT Stop Time 1028    OT Time Calculation (min) 43 min    Activity Tolerance Patient tolerated treatment well    Behavior During Therapy [facility] for tasks assessed/performed             Past Medical History:  Diagnosis Date   Arthritis    GERD (gastroesophageal reflux disease)    Hx of basal cell carcinoma 01/13/2013   left parasternal chest   Hx of basal cell carcinoma 04/26/2015   right sacral area   Hx of basal cell carcinoma 04/26/2016   left temple   Hypertension     Past Surgical History:  Procedure Laterality Date   ABDOMINAL HYSTERECTOMY     APPENDECTOMY     BACK SURGERY     COLONOSCOPY WITH PROPOFOL N/A 03/07/2017   Procedure: COLONOSCOPY WITH PROPOFOL;  Surgeon: Toledo, Boykin Nearing, MD;  Location: [facility] ENDOSCOPY;  Service: Gastroenterology;  Laterality: N/A;   ESOPHAGOGASTRODUODENOSCOPY N/A 03/07/2017   Procedure: ESOPHAGOGASTRODUODENOSCOPY (EGD);  Surgeon: Toledo, Boykin Nearing, MD;  Location: [facility] ENDOSCOPY;  Service: Gastroenterology;  Laterality: N/A;   FOOT SURGERY Left 05/2016   JOINT REPLACEMENT Bilateral 2010 2011   REPLACEMENT TOTAL KNEE BILATERAL  2010    There were no vitals filed for this visit.   Subjective Assessment - 10/05/22 1005     Subjective  I am so happy - I can see progress every today - easier to do things - tie my shoes, putting hand in pocket and put on glove    Pertinent History Per chart, recent MD note:  Ms. Onishi returns today for re-evaluation of her left upper extremity. She is a 87  y.o. female who I last saw in 07/04/2022. Patient is status post left rTSA with a block with Dr. Olga Millers on 09/23/2021. She states that in PACU she noticed numbness in the radial nerve distribution and was unable to extend her wrist. Since that time she has regained wrist extension. She has not noted improvement in the numbness in the radial nerve distribution. She has had multiple EMG studies which have shown electrodiagnostic evidence of recovery of the radial nerve proper. She returns today for reevaluation. She notes that she has had some new onset swelling at the dorsal aspect of her left hand. She is not sure why this happened. She has not seen any improvement in terms of her finger or thumb function. Examination of the left upper extremity shows she has normal sensation in ulnar and median nerve distributions. She has diminished sensation and pressure perception to light touch in left radial nerve distribution. Skin is intact. Fingers are warm and well perfused with brisk capillary refill and she has a palpable radial pulse. There is no tenderness to palpation throughout. 5/5 triceps. 5/5 brachioradialis. 5/5 left wrist extension through her radial wrist extenders, 0/5 EDC, 0/5 EPL. 0/5 EIP    Patient Stated Goals Pt would like to improve movement and strength of her left hand to complete daily tasks with  greater ease and independence.    Currently in Pain? No/denies            Patient able to don gloves with more ease as well as getting hand in pocket. When assessing reaching between objects to grasp a cylinder up check while abducting thumb-was unable to maintain position Decreased MC extension with wrist extension still. Able to tie shoes              OT Treatments/Exercises (OP) - 10/05/22 0001       LUE Paraffin   Number Minutes Paraffin 8 Minutes    LUE Paraffin Location Wrist;Hand    Comments Wrist and digits extention            Done some soft tissue massage using  Graston #2 2 for brushing and massage tone and volar hand and wrist and forearm as well as digits prior to composite extension stretch Coming in MC's extention this date -30 to 35 place and hold After WB and especially wall slides-hovering of table- 20 to -30 t 2nd digit -40 but enlarge MC joint  Thumb PA 50 and RA 40 degrees     Pt to cont  hand base splint for MC extention and thumb  Will assess next week if patient needs a different splint for nighttime to rest metacarpals and extension.  Patient done better with wall slides as a composite extension stretch prior to her fine motor and digit extension exercises Patient report some tenderness over the A1 pulley of the third Have patient hold off on any gripping and 3-point pinch putty or fine motor. Instead of Weightbearing through palm and chair Patient is to work on digit extension of flat surface after wall slides-hovering of table. Reaching between 2 objects maintaining digit extension while doing palmar abduction with thumb Done very well after wall slides  Add rubber band for digit extension at metacarpals and thumb palmar abduction Placed on hold away from 2 cm cylindrical object   Facilitate digits extention rolling over red roller -2 x 10 reps- relax into hyper ext stretch REINFORCE     Add light blue putty for patient for lateral pinch with focusing on thumb radial abduction in between 10 reps  With rolling putty in between for digit extension           OT Education - 10/05/22 1007     Education Details HEP    Person(s) Educated Patient    Methods Explanation;Demonstration;Tactile cues;Verbal cues    Comprehension Verbalized understanding              OT Short Term Goals - 09/13/22 1620       OT SHORT TERM GOAL #1   Title Pt will be independent with HEP for left wrist and hand    Baseline Eval:  continues to perform exercises from prior OT tx, will need updated HEP.    Time 3    Period Weeks    Status  New    Target Date 10/02/22               OT Long Term Goals - 09/13/22 1620       OT LONG TERM GOAL #1   Title Pt will demonstrate improve MP extension by 10 degrees to facilitate greater active reach for objects    Baseline Eval:  Limited MCP extension    Time 6    Period Weeks    Status New    Target Date 10/25/22  OT LONG TERM GOAL #2   Title Pt will demonstrate improvement of grip by 10# to grasp items without dropping.    Baseline Eval:  left grip limited to 24#, difficulty maintaining grip on objects    Time 6    Period Weeks    Target Date 10/25/22      OT LONG TERM GOAL #3   Title Pt will demonstrate improved functional  strength in wrist and hand to operate manual can opener without difficulty.    Baseline Eval:  difficulty with using manual can opener    Time 6    Period Weeks    Status New    Target Date 10/25/22      OT LONG TERM GOAL #4   Title Pt will demonstrate improved hand function and coordination to engage in meal preparation tasks with modified independence.    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  She reports afterward she had numbness along the left radial nerve distribution and decreased hand and wrist motion.  She was seen for Occupational Therapy in [facility] for a limited number of visits and has not had any OT in the last 3 months.  Her last EMG study indicated healing over time.  . She presents  at eval with limited active motion in her left hand especially for finger extension at [facility] and thumb PA and RA, decreased strength overall but also decreased grip and  pinch strengths with comparison. Pt cont to show increase  exention of 5th thru 3rd MC  more than 2nd , thumb RA  more than PA and increase grip strength and functional use.  Patient has more ease for putting her hand in her pocket as well as donning a glove of and time shoes.  Having trouble maintaining MC extension with wrist extension while reaching for cylinder objects while maintaining palmar abduction.  Pt  wants to see improvements in her left hand function to perform daily tasks with greater ease and independence.  Pt would benefit from skilled OT services to maximize her safety and independence with ADL and IADL tasks at home and in the community.    OT Occupational Profile and History Detailed Assessment- Review of Records and additional review of physical, cognitive, psychosocial history related to current functional performance    Occupational performance deficits (Please refer to evaluation for details): ADL's;IADL's;Leisure    Body Structure / Function / Physical Skills ADL;ROM;UE functional use;FMC;Dexterity;Sensation;Edema;Strength;Coordination;IADL    Psychosocial Skills Habits;Routines and Behaviors;Environmental  Adaptations    Rehab Potential Good    Clinical Decision Making Several treatment options, min-mod task modification necessary    Comorbidities Affecting Occupational Performance: May have comorbidities impacting occupational performance    Modification or Assistance to Complete Evaluation  No modification of tasks or assist necessary to complete eval    OT Frequency 1x / week    OT Duration 4 weeks    OT Treatment/Interventions Self-care/ADL training;Electrical Stimulation;Therapeutic exercise;Coping strategies  training;Moist Heat;Paraffin;Neuromuscular education;Splinting;Patient/family education;Fluidtherapy;Therapeutic activities;Cryotherapy;Ultrasound;Contrast Bath;DME and/or AE instruction;Manual Therapy;Passive range of motion    Consulted and Agree with Plan of Care  Patient             Patient will benefit from skilled therapeutic intervention in order to improve the following deficits and impairments:   Body Structure / Function / Physical Skills: ADL, ROM, UE functional use, FMC, Dexterity, Sensation, Edema, Strength, Coordination, IADL   Psychosocial Skills: Habits, Routines and Behaviors, Environmental  Adaptations   Visit Diagnosis: Other lack of coordination  Muscle weakness (generalized)  Localized edema  Other disturbances of skin sensation    Problem List Patient Active Problem List   Diagnosis Date Noted   CVA (cerebral infarction) 11/28/2015    Oletta Cohn, OTR/L,CLT 10/05/2022, 12:19 PM  [facility] [address] [place], [address] Phone: [phone]   Fax:  [phone]  Name: [name] MRN: [id number] Date of Birth: 18-Nov-1935

## 2022-10-06 NOTE — Progress Notes (Signed)
[name] ([id number]) [id number] Page 1 of 2 Visit Report for 10/04/2022 Physician Orders Details Patient Name: Date of Service: [name] RA R. 10/04/2022 8:00 A M Medical Record Number: [id number] Patient Account Number: [id number] Date of Birth/Sex: Treating RN: 02/27/1936 (87 y.o. [name] Primary Care Provider: Clydie [name] Other Clinician: Referring Provider: Treating Provider/Extender: [name] in Treatment: 8 Verbal / Phone Orders: No Diagnosis Coding Follow-up Appointments Return Appointment in 1 week. Nurse Visit as needed - back Thursday or Friday to check wrap Bathing/ Shower/ Hygiene May shower with wound dressing protected with water repellent cover or cast protector. No tub bath. Anesthetic (Use 'Patient Medications' Section for Anesthetic Order Entry) Wound #1 Right Lower Leg Lidocaine applied to wound bed Edema Control - Lymphedema / Segmental Compressive Device / Other Elevate, Exercise Daily and A void Standing for Long Periods of Time. Elevate legs to the level of the heart and pump ankles as often as possible Elevate leg(s) parallel to the floor when sitting. DO YOUR BEST to sleep in the bed at night. DO NOT sleep in your recliner. Long hours of sitting in a recliner leads to swelling of the legs and/or potential wounds on your backside. Other: - Please call office if you have any issues with wrap, call if it falls lower than three fingers worth, if you have any pain issues or changes in sensation to right lower extremity. Wound Treatment Wound #1 - Lower Leg Wound Laterality: Right Cleanser: Soap and Water 3 x Per Week/30 Days Discharge Instructions: Gently cleanse wound with antibacterial soap, rinse and pat dry prior to dressing wounds Cleanser: Vashe 5.8 (oz) 3 x Per Week/30 Days Discharge Instructions: Use vashe 5.8 (oz) as directed Prim Dressing: Prisma 4.34 (in)  (Generic) 3 x Per Week/30 Days ary Discharge Instructions: Moisten w/normal saline or sterile water; Cover wound as directed. Do not remove from wound bed. Secondary Dressing: Zetuvit Plus 4x4 (in/in) 3 x Per Week/30 Days Compression Wrap: Urgo K2 Lite, two layer compression system, regular 3 x Per Week/30 Days Electronic Signature(s) Signed: 10/04/2022 3:46:29 PM By: Geralyn Corwin DO Signed: 10/05/2022 5:02:21 PM By: Midge Aver MSN RN CNS WTA Entered By: Midge Aver on 10/04/2022 07:59:00 -------------------------------------------------------------------------------- SuperBill Details Patient Name: Date of Service: [name] RA R. 10/04/2022 Medical Record Number: [id number] Patient Account Number: [id number] Date of Birth/Sex: Treating RN: 01-05-1936 (87 y.o. [name] Primary Care Provider: Clydie [name] Other Clinician: Referring Provider: Treating Provider/Extender: [name] in Treatment: 8 Diagnosis Coding ICD-10 Codes [name], [name] ([id number]) [id number] Page 2 of 2 Code Description [phone] Laceration without foreign body, right lower leg, initial encounter L97.812 Non-pressure chronic ulcer of other part of right lower leg with fat layer exposed I73.89 Other specified peripheral vascular diseases I10 Essential (primary) hypertension Facility Procedures : CPT4 Code: 13244010 Description: (Facility Use Only) [phone] - APPLY MULTLAY COMPRS LWR RT LEG Modifier: Quantity: 1 Electronic Signature(s) Signed: 10/04/2022 3:46:29 PM By: Geralyn Corwin DO Signed: 10/05/2022 5:02:21 PM By: Midge Aver MSN RN CNS WTA Entered By: Midge Aver on 10/04/2022 07:59:41

## 2022-10-10 ENCOUNTER — Encounter: Payer: HMO | Admitting: Physician Assistant

## 2022-10-10 DIAGNOSIS — L97812 Non-pressure chronic ulcer of other part of right lower leg with fat layer exposed: Secondary | ICD-10-CM | POA: Diagnosis not present

## 2022-10-10 DIAGNOSIS — S81811A Laceration without foreign body, right lower leg, initial encounter: Secondary | ICD-10-CM | POA: Diagnosis not present

## 2022-10-12 ENCOUNTER — Ambulatory Visit: Payer: HMO | Admitting: Occupational Therapy

## 2022-10-12 DIAGNOSIS — R278 Other lack of coordination: Secondary | ICD-10-CM | POA: Diagnosis not present

## 2022-10-12 DIAGNOSIS — M6281 Muscle weakness (generalized): Secondary | ICD-10-CM

## 2022-10-12 DIAGNOSIS — R208 Other disturbances of skin sensation: Secondary | ICD-10-CM

## 2022-10-12 DIAGNOSIS — R600 Localized edema: Secondary | ICD-10-CM

## 2022-10-12 NOTE — Therapy (Signed)
[facility] [address] [place], [address] Phone: [phone]   Fax:  [phone]  Occupational Therapy Treatment  Patient Details  Name: [name] MRN: [id number] Date of Birth: [birth year] No data recorded  Encounter Date: 10/12/2022   OT End of Session - 10/12/22 2149     Visit Number 6    Number of Visits 13    Date for OT Re-Evaluation 10/25/22    OT Start Time 1115    OT Stop Time 1204    OT Time Calculation (min) 49 min    Activity Tolerance Patient tolerated treatment well    Behavior During Therapy [facility] for tasks assessed/performed             Past Medical History:  Diagnosis Date   Arthritis    GERD (gastroesophageal reflux disease)    Hx of basal cell carcinoma 01/13/2013   left parasternal chest   Hx of basal cell carcinoma 04/26/2015   right sacral area   Hx of basal cell carcinoma 04/26/2016   left temple   Hypertension     Past Surgical History:  Procedure Laterality Date   ABDOMINAL HYSTERECTOMY     APPENDECTOMY     BACK SURGERY     COLONOSCOPY WITH PROPOFOL N/A 03/07/2017   Procedure: COLONOSCOPY WITH PROPOFOL;  Surgeon: Toledo, Boykin Nearing, MD;  Location: [facility] ENDOSCOPY;  Service: Gastroenterology;  Laterality: N/A;   ESOPHAGOGASTRODUODENOSCOPY N/A 03/07/2017   Procedure: ESOPHAGOGASTRODUODENOSCOPY (EGD);  Surgeon: Toledo, Boykin Nearing, MD;  Location: [facility] ENDOSCOPY;  Service: Gastroenterology;  Laterality: N/A;   FOOT SURGERY Left 05/2016   JOINT REPLACEMENT Bilateral 2010 2011   REPLACEMENT TOTAL KNEE BILATERAL  2010    There were no vitals filed for this visit.   Subjective Assessment - 10/12/22 2148     Subjective  Doing well -my hand was 0/10 and now close to 10/10 - using it a lot more    Pertinent History Per chart, recent MD note:  Ms. Stolze returns today for re-evaluation of her left upper extremity. She is a 87 y.o. female who I last saw in 07/04/2022. Patient  is status post left rTSA with a block with Dr. Olga Millers on 09/23/2021. She states that in PACU she noticed numbness in the radial nerve distribution and was unable to extend her wrist. Since that time she has regained wrist extension. She has not noted improvement in the numbness in the radial nerve distribution. She has had multiple EMG studies which have shown electrodiagnostic evidence of recovery of the radial nerve proper. She returns today for reevaluation. She notes that she has had some new onset swelling at the dorsal aspect of her left hand. She is not sure why this happened. She has not seen any improvement in terms of her finger or thumb function. Examination of the left upper extremity shows she has normal sensation in ulnar and median nerve distributions. She has diminished sensation and pressure perception to light touch in left radial nerve distribution. Skin is intact. Fingers are warm and well perfused with brisk capillary refill and she has a palpable radial pulse. There is no tenderness to palpation throughout. 5/5 triceps. 5/5 brachioradialis. 5/5 left wrist extension through her radial wrist extenders, 0/5 EDC, 0/5 EPL. 0/5 EIP    Patient Stated Goals Pt would like to improve movement and strength of her left hand to complete daily tasks with greater ease and independence.    Currently in Pain? No/denies  Pt coming in [facility] extention -60 at 2nd , -45 and 3rd and -35 at 4th and 5th  Lacking more extention when doing wrist extention with digits extention After wall slides-hovering of table- 20 to -30 2nd digit -40 but enlarge MC joint  Thumb PA 50 and RA 50 degrees after place and hold     Pt to cont  hand base splint for MC extention and thumb  Will assess next week if patient needs a different splint for nighttime to rest metacarpals and extension.   Patient done better with wall slides as a composite extension stretch prior to her fine motor and digit extension  exercises Patient report some tenderness over the A1 pulley of the third but improved since last week And no triggering  Pt to cont to hold off on any gripping and 3-point pinch putty or fine motor. Instead of Weightbearing through palm and chair Patient is to work on digit extension of flat surface after wall slides-hovering of table. Reaching between 2 objects maintaining digit extension while doing palmar abduction with thumb Done very well after wall slides  Cont rubber band for PA and RA place and hold   Facilitate digits extention rolling over red roller -2 x 10 reps- relax into hyper ext stretch REINFORCE   Fabricate hand base digits extention splint for night time to keep [facility] extention at full extention                     OT Education - 10/12/22 2149     Education Details HEP and splint wearing    Person(s) Educated Patient    Methods Explanation;Demonstration;Tactile cues;Verbal cues    Comprehension Verbalized understanding              OT Short Term Goals - 10/12/22 2150       OT SHORT TERM GOAL #1   Title Pt will be independent with HEP for left wrist and hand    Status Achieved               OT Long Term Goals - 10/12/22 2150       OT LONG TERM GOAL #1   Title Pt will demonstrate improve MP extension by 10 degrees to facilitate greater active reach for objects    Baseline MC extention increase for pt to donn glove , put hand in pocket and release objects    Status Achieved      OT LONG TERM GOAL #2   Title Pt will demonstrate improvement of grip by 10# to grasp items without dropping.    Baseline Eval:  left grip limited to 24#, difficulty maintaining grip on objects - NOW increase to 30 lbs -    Time 2    Period Weeks    Status On-going    Target Date 10/25/22      OT LONG TERM GOAL #3   Title Pt will demonstrate improved functional  strength in wrist and hand to operate manual can opener without difficulty.    Status Achieved       OT LONG TERM GOAL #4   Title Pt will demonstrate improved hand function and coordination to engage in meal preparation tasks with modified independence.    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  She reports afterward she had numbness along the left radial nerve distribution and decreased hand and wrist motion.  She was seen for Occupational Therapy in [facility] for a limited number of visits and has not had any OT in the last 3 months.  Her last EMG study indicated healing over time.  . She presents  at eval with limited active motion in her left hand especially for finger extension at [facility] and thumb PA and RA, decreased strength overall but also decreased grip and pinch strengths with comparison. Pt cont to show increase  exention of 5th thru 3rd MC  more than 2nd , thumb RA and PA 35 and place and hold 55 degrees. Show increase grip strength and functional use.  Patient has more ease for putting her hand in her pocket as well as donning a glove of and tying shoes.  Having trouble maintaining MC extension with wrist extension while reaching  between cylinder objects while maintaining MCextention and palmar abduction.  Pt  wants to see improvements in her left hand function to perform daily tasks with greater ease and independence.  Pt would benefit from skilled OT services to maximize her safety and independence with ADL and IADL tasks at home and in the community.    OT Occupational Profile and History Detailed Assessment- Review of Records and additional review of physical, cognitive,  psychosocial history related to current functional performance    Occupational performance deficits (Please refer to evaluation for details): ADL's;IADL's;Leisure    Body Structure / Function / Physical Skills ADL;ROM;UE functional use;FMC;Dexterity;Sensation;Edema;Strength;Coordination;IADL    Psychosocial Skills Habits;Routines and Behaviors;Environmental  Adaptations    Rehab Potential Good    Clinical Decision Making Several treatment options, min-mod task modification necessary    Comorbidities Affecting Occupational Performance: May have comorbidities impacting occupational performance    Modification or Assistance to Complete Evaluation  No modification of tasks or assist necessary to complete eval    OT Frequency 1x / week    OT Duration 4 weeks    OT Treatment/Interventions Self-care/ADL training;Electrical Stimulation;Therapeutic exercise;Coping strategies training;Moist Heat;Paraffin;Neuromuscular education;Splinting;Patient/family education;Fluidtherapy;Therapeutic activities;Cryotherapy;Ultrasound;Contrast Bath;DME and/or AE instruction;Manual Therapy;Passive range of motion    Consulted and Agree with Plan of Care Patient             Patient will benefit from skilled therapeutic intervention in order to improve the following deficits and impairments:   Body Structure / Function / Physical Skills: ADL, ROM, UE functional use, FMC, Dexterity, Sensation, Edema, Strength, Coordination, IADL   Psychosocial Skills: Habits, Routines and Behaviors, Environmental  Adaptations   Visit Diagnosis: Other lack of coordination  Muscle weakness (generalized)  Localized edema  Other disturbances of skin sensation    Problem List Patient Active Problem List   Diagnosis Date Noted   CVA (cerebral infarction) 11/28/2015    Oletta Cohn, OTR/L,CLT 10/12/2022, 9:56 PM  [place] [address] [place],  [address] Phone: [phone]   Fax:  [phone]  Name: [name] MRN: [id number] Date of Birth: 1935/06/16

## 2022-10-19 ENCOUNTER — Ambulatory Visit: Payer: HMO | Admitting: Occupational Therapy

## 2022-10-19 ENCOUNTER — Encounter: Payer: HMO | Admitting: Physician Assistant

## 2022-10-19 DIAGNOSIS — R208 Other disturbances of skin sensation: Secondary | ICD-10-CM

## 2022-10-19 DIAGNOSIS — R600 Localized edema: Secondary | ICD-10-CM

## 2022-10-19 DIAGNOSIS — R278 Other lack of coordination: Secondary | ICD-10-CM | POA: Diagnosis not present

## 2022-10-19 DIAGNOSIS — L97812 Non-pressure chronic ulcer of other part of right lower leg with fat layer exposed: Secondary | ICD-10-CM | POA: Diagnosis not present

## 2022-10-19 DIAGNOSIS — M6281 Muscle weakness (generalized): Secondary | ICD-10-CM

## 2022-10-19 DIAGNOSIS — S81801A Unspecified open wound, right lower leg, initial encounter: Secondary | ICD-10-CM | POA: Diagnosis not present

## 2022-10-19 NOTE — Therapy (Signed)
[facility] [address] [place], [address] Phone: [phone]   Fax:  [phone]  Occupational Therapy Treatment  Patient Details  Name: [name] MRN: [id number] Date of Birth: 11-04-1935 No data recorded  Encounter Date: 10/19/2022   OT End of Session - 10/19/22 1341     Visit Number 7    Number of Visits 13    Date for OT Re-Evaluation 10/25/22    OT Start Time 1321    OT Stop Time 1400    OT Time Calculation (min) 39 min    Activity Tolerance Patient tolerated treatment well    Behavior During Therapy [facility] for tasks assessed/performed             Past Medical History:  Diagnosis Date   Arthritis    GERD (gastroesophageal reflux disease)    Hx of basal cell carcinoma 01/13/2013   left parasternal chest   Hx of basal cell carcinoma 04/26/2015   right sacral area   Hx of basal cell carcinoma 04/26/2016   left temple   Hypertension     Past Surgical History:  Procedure Laterality Date   ABDOMINAL HYSTERECTOMY     APPENDECTOMY     BACK SURGERY     COLONOSCOPY WITH PROPOFOL N/A 03/07/2017   Procedure: COLONOSCOPY WITH PROPOFOL;  Surgeon: Toledo, Boykin Nearing, MD;  Location: [facility] ENDOSCOPY;  Service: Gastroenterology;  Laterality: N/A;   ESOPHAGOGASTRODUODENOSCOPY N/A 03/07/2017   Procedure: ESOPHAGOGASTRODUODENOSCOPY (EGD);  Surgeon: Toledo, Boykin Nearing, MD;  Location: [facility] ENDOSCOPY;  Service: Gastroenterology;  Laterality: N/A;   FOOT SURGERY Left 05/2016   JOINT REPLACEMENT Bilateral 2010 2011   REPLACEMENT TOTAL KNEE BILATERAL  2010    There were no vitals filed for this visit.   Subjective Assessment - 10/19/22 1340     Subjective  Doing okay - fingers are stiff when getting up in the am but more straigth - I am still 10/10 better compare to when I started with you    Pertinent History Per chart, recent MD note:  Ms. Goodenough returns today for re-evaluation of her left upper extremity.  She is a 87 y.o. female who I last saw in 07/04/2022. Patient is status post left rTSA with a block with Dr. Olga Millers on 09/23/2021. She states that in PACU she noticed numbness in the radial nerve distribution and was unable to extend her wrist. Since that time she has regained wrist extension. She has not noted improvement in the numbness in the radial nerve distribution. She has had multiple EMG studies which have shown electrodiagnostic evidence of recovery of the radial nerve proper. She returns today for reevaluation. She notes that she has had some new onset swelling at the dorsal aspect of her left hand. She is not sure why this happened. She has not seen any improvement in terms of her finger or thumb function. Examination of the left upper extremity shows she has normal sensation in ulnar and median nerve distributions. She has diminished sensation and pressure perception to light touch in left radial nerve distribution. Skin is intact. Fingers are warm and well perfused with brisk capillary refill and she has a palpable radial pulse. There is no tenderness to palpation throughout. 5/5 triceps. 5/5 brachioradialis. 5/5 left wrist extension through her radial wrist extenders, 0/5 EDC, 0/5 EPL. 0/5 EIP    Patient Stated Goals Pt would like to improve movement and strength of her left hand to complete daily tasks with  greater ease and independence.    Currently in Pain? No/denies                [facility] OT Assessment - 10/19/22 0001       Strength   Right Hand Grip (lbs) 40    Right Hand Lateral Pinch 7 lbs    Right Hand 3 Point Pinch 5 lbs    Left Hand Grip (lbs) 32    Left Hand Lateral Pinch 7 lbs    Left Hand 3 Point Pinch 5 lbs              Patient arrived this date with about the same [facility] extension.  Bilateral neutral without gravity as well as wrist and hand to neutral against gravity. Continued to have difficulty maintaining MC extension with wrist extension. Patient continues to  do wall slides for composite flexor stretch to forearm and hand prior to home exercises        OT Treatments/Exercises (OP) - 10/19/22 0001       LUE Paraffin   Number Minutes Paraffin 8 Minutes    LUE Paraffin Location Wrist;Hand    Comments wrist and diigits prior to ROM             Patient unable to do resistance of rubber band for [facility] extension Or manual resistance from OT. Patient to show increased individual digit extension  while opposing to digits.  This date at patient maintaining MC extension with wrist neutral with a light object like 4 ounce putty on dorsal digit maintaining extension 12 reps against gravity Patient doing MC PIP extension simulating or flipping into digit pushing up check like 4 ounce putty 2 sets of 12 reps Patient doing opposition with wrist flexion while maintaining at 3 digits and MCP and PIP extension Patient needed mod to max verbal cueing and tactile cueing. Light blue putty on table patient pushing with digits into extension into putty 2 sets of 15. Patient is monitorable cueing to not compensate with forearm.  Patient to do these exercises with wall slides in between for composite flexor stretch       OT Education - 10/19/22 1341     Education Details HEP and splint wearing    Person(s) Educated Patient    Methods Explanation;Demonstration;Tactile cues;Verbal cues    Comprehension Verbalized understanding              OT Short Term Goals - 10/12/22 2150       OT SHORT TERM GOAL #1   Title Pt will be independent with HEP for left wrist and hand    Status Achieved               OT Long Term Goals - 10/12/22 2150       OT LONG TERM GOAL #1   Title Pt will demonstrate improve MP extension by 10 degrees to facilitate greater active reach for objects    Baseline MC extention increase for pt to donn glove , put hand in pocket and release objects    Status Achieved      OT LONG TERM GOAL #2   Title Pt will  demonstrate improvement of grip by 10# to grasp items without dropping.    Baseline Eval:  left grip limited to 24#, difficulty maintaining grip on objects - NOW increase to 30 lbs -    Time 2    Period Weeks    Status On-going    Target Date 10/25/22  OT LONG TERM GOAL #3   Title Pt will demonstrate improved functional  strength in wrist and hand to operate manual can opener without difficulty.    Status Achieved      OT LONG TERM GOAL #4   Title Pt will demonstrate improved hand function and coordination to engage in meal preparation tasks with modified independence.    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  She reports afterward she had numbness along the left radial nerve distribution and decreased hand and wrist motion.  Her last EMG study indicated healing over time.  . She presents  at eval with limited active motion in her left hand especially for finger extension at [facility] and thumb PA and RA, decreased strength overall but also decreased grip and pinch strengths with comparison. Pt cont to show increase extention of digits and thumb PA and RA. Show increase grip strength and functional use.  Patient has more ease for putting her hand in her pocket as well as donning a glove of and tying shoes.  Pt this date able to main digit extention of other digits  when doing opposition to digits. Having trouble maintaining MC extension with wrist extension while reaching  between cylinder objects while maintaining MCextention and palmar abduction.  Pt  wants to see improvements in her left hand  function to perform daily tasks with greater ease and independence. Was able to initiate some strengthening or PIP and MC extention.  Pt would benefit from skilled OT services to maximize her safety and independence with ADL and IADL tasks at home and in the community.    OT Occupational Profile and History Detailed Assessment- Review of Records and additional review of physical, cognitive, psychosocial history related to current functional performance    Occupational performance deficits (Please refer to evaluation for details): ADL's;IADL's;Leisure    Body Structure / Function / Physical Skills ADL;ROM;UE functional use;FMC;Dexterity;Sensation;Edema;Strength;Coordination;IADL    Psychosocial Skills Habits;Routines and Behaviors;Environmental  Adaptations    Rehab Potential Good    Clinical Decision Making Several treatment options, min-mod task modification necessary    Comorbidities Affecting Occupational Performance: May have comorbidities impacting occupational performance    Modification or Assistance to Complete Evaluation  No modification of tasks or assist necessary to complete eval    OT Frequency 1x / week    OT Duration 4 weeks    OT Treatment/Interventions Self-care/ADL training;Electrical Stimulation;Therapeutic exercise;Coping strategies training;Moist Heat;Paraffin;Neuromuscular education;Splinting;Patient/family education;Fluidtherapy;Therapeutic activities;Cryotherapy;Ultrasound;Contrast Bath;DME and/or AE instruction;Manual Therapy;Passive range of motion    Consulted and Agree with Plan of Care Patient             Patient will benefit from skilled therapeutic intervention in order to improve the following deficits and impairments:   Body Structure / Function / Physical Skills: ADL, ROM, UE functional use, FMC, Dexterity, Sensation, Edema, Strength, Coordination, IADL   Psychosocial Skills: Habits, Routines and Behaviors, Environmental  Adaptations   Visit  Diagnosis: Other lack of coordination  Muscle weakness (generalized)  Localized edema  Other disturbances of skin sensation    Problem List Patient Active Problem List   Diagnosis Date Noted   CVA (cerebral infarction) 11/28/2015    Oletta Cohn, OTR/L,CLT 10/19/2022, 6:54 PM  [facility] [address] [place], [address] Phone: [phone]   Fax:  [phone]  Name: [name] MRN: [id number] Date of Birth: 12-17-1935

## 2022-10-20 DIAGNOSIS — S81801A Unspecified open wound, right lower leg, initial encounter: Secondary | ICD-10-CM | POA: Diagnosis not present

## 2022-10-24 ENCOUNTER — Encounter: Payer: HMO | Attending: Physician Assistant | Admitting: Physician Assistant

## 2022-10-24 DIAGNOSIS — L97812 Non-pressure chronic ulcer of other part of right lower leg with fat layer exposed: Secondary | ICD-10-CM | POA: Diagnosis not present

## 2022-10-24 DIAGNOSIS — I739 Peripheral vascular disease, unspecified: Secondary | ICD-10-CM | POA: Insufficient documentation

## 2022-10-24 DIAGNOSIS — I10 Essential (primary) hypertension: Secondary | ICD-10-CM | POA: Diagnosis not present

## 2022-10-24 DIAGNOSIS — S81811A Laceration without foreign body, right lower leg, initial encounter: Secondary | ICD-10-CM | POA: Diagnosis not present

## 2022-10-24 NOTE — Progress Notes (Signed)
EDLIN, PAPSON ([id number]) [id number] Page 1 of 3 Visit Report for 10/24/2022 Chief Complaint Document Details Patient Name: Date of Service: [name]. 10/24/2022 10:30 A M Medical Record Number: [id number] Patient Account Number: [id number] Date of Birth/Sex: Treating RN: [birth year] (87 y.o. [name] Primary Care Provider: Clydie Braun Other Clinician: Referring Provider: Treating Provider/Extender: Sydnee Cabal in Treatment: 11 Information Obtained from: Patient Chief Complaint Skin tear right leg 07/24/22 Electronic Signature(s) Signed: 10/24/2022 10:48:49 AM By: Allen Derry PA-C Entered By: Allen Derry on 10/24/2022 10:48:49 -------------------------------------------------------------------------------- Physician Orders Details Patient Name: Date of Service: [name] [name]. 10/24/2022 10:30 A M Medical Record Number: [id number] Patient Account Number: [id number] Date of Birth/Sex: Treating RN: 10-01-1935 (87 y.o. [name] Primary Care Provider: Clydie Braun Other Clinician: Referring Provider: Treating Provider/Extender: Sydnee Cabal in Treatment: 11 Verbal / Phone Orders: No Diagnosis Coding ICD-10 Coding Code Description [phone] Laceration without foreign body, right lower leg, initial encounter L97.812 Non-pressure chronic ulcer of other part of right lower leg with fat layer exposed I73.89 Other specified peripheral vascular diseases I10 Essential (primary) hypertension Follow-up Appointments Return Appointment in 1 week. Bathing/ Shower/ Hygiene May shower with wound dressing protected with water repellent cover or cast protector. No tub bath. Anesthetic (Use 'Patient Medications' Section for Anesthetic Order Entry) Wound #1 Right Lower Leg Lidocaine applied to wound bed Edema Control - Lymphedema / Segmental Compressive Device / Other Tubigrip  single layer applied. Elevate, Exercise Daily and A void Standing for Long Periods of Time. Elevate legs to the level of the heart and pump ankles as often as possible Elevate leg(s) parallel to the floor when sitting. DO YOUR BEST to sleep in the bed at night. DO NOT sleep in your recliner. Long hours of sitting in a recliner leads to swelling of the legs and/or potential wounds on your backside. Wound Treatment Wound #1 - Lower Leg Wound Laterality: Right Cleanser: Soap and Water 3 x Per Week/30 Days Discharge Instructions: Gently cleanse wound with antibacterial soap, rinse and pat dry prior to dressing wounds Cleanser: Vashe 5.8 (oz) 3 x Per Week/30 Days Discharge Instructions: Use vashe 5.8 (oz) as directed [name], [name] ([id number]) [id number] Page 2 of 3 Prim Dressing: Silvercel Small 2x2 (in/in) 3 x Per Week/30 Days ary Discharge Instructions: Apply Silvercel Small 2x2 (in/in) as instructed Secondary Dressing: (BORDER) Zetuvit Plus SILICONE BORDER Dressing 4x4 (in/in) (Generic) 3 x Per Week/30 Days Discharge Instructions: Please do not put silicone bordered dressings under wraps. Use non-bordered dressing only. Secured With: Tubigrip Size D, 3x10 (in/yd) 3 x Per Week/30 Days Electronic Signature(s) Unsigned Entered By: Angelina Pih on 10/24/2022 11:22:42 -------------------------------------------------------------------------------- Problem List Details Patient Name: Date of Service: [name]. 10/24/2022 10:30 A M Medical Record Number: [id number] Patient Account Number: [id number] Date of Birth/Sex: Treating RN: Apr 16, 1936 (87 y.o. 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 chronic ulcer of other part of right lower leg with fat layer 08/08/2022 No Yes exposed I73.89 Other specified peripheral vascular diseases 08/08/2022 No Yes I10 Essential (primary) hypertension 08/08/2022 No Yes Inactive Problems Resolved Problems Electronic Signature(s) Signed: 10/24/2022 10:48:46 AM By: Allen Derry PA-C Entered By: Allen Derry on 10/24/2022 10:48:46 --------------------------------------------------------------------------------  SuperBill Details Patient Name: Date of Service: [name] 10/24/2022 Medical Record Number: [id number] Patient Account Number: [id number] Date of Birth/Sex: Treating RN: Jul 19, 1935 (87 y.o. [name] Primary Care Provider: Clydie Braun Other Clinician: Referring Provider: Treating Provider/Extender: Sydnee Cabal in Treatment: [address] [name], [name] ([id number]) [id number] Page 3 of 3 ICD-10 Codes Code Description [phone] Laceration without foreign body, right lower leg, initial encounter L97.812 Non-pressure chronic ulcer of other part of right lower leg with fat layer exposed I73.89 Other specified peripheral vascular diseases I10 Essential (primary) hypertension Facility Procedures : CPT4 Code: 13086578 Description: 99213 - WOUND CARE VISIT-LEV 3 EST PT Modifier: Quantity: 1 Electronic Signature(s) Unsigned Entered By: Angelina Pih on 10/24/2022 11:23:10 Signature(s): Date(s):

## 2022-10-26 ENCOUNTER — Ambulatory Visit: Payer: HMO | Attending: Orthopedic Surgery | Admitting: Occupational Therapy

## 2022-10-26 DIAGNOSIS — R600 Localized edema: Secondary | ICD-10-CM | POA: Diagnosis not present

## 2022-10-26 DIAGNOSIS — R208 Other disturbances of skin sensation: Secondary | ICD-10-CM | POA: Diagnosis not present

## 2022-10-26 DIAGNOSIS — R278 Other lack of coordination: Secondary | ICD-10-CM | POA: Insufficient documentation

## 2022-10-26 DIAGNOSIS — M6281 Muscle weakness (generalized): Secondary | ICD-10-CM | POA: Diagnosis not present

## 2022-10-26 NOTE — Therapy (Signed)
[facility] [address], [place], [address] Phone: [phone]   Fax:  [phone]  Occupational Therapy Treatment  Patient Details  Name: [name] MRN: [id number] Date of Birth: 28-Sep-1935 No data recorded  Encounter Date: 10/26/2022   OT End of Session - 10/26/22 1203     Visit Number 8    Number of Visits 13    Date for OT Re-Evaluation 10/25/22    OT Start Time 1205    OT Stop Time 1250    OT Time Calculation (min) 45 min    Activity Tolerance Patient tolerated treatment well    Behavior During Therapy [facility] for tasks assessed/performed             Past Medical History:  Diagnosis Date   Arthritis    GERD (gastroesophageal reflux disease)    Hx of basal cell carcinoma 01/13/2013   left parasternal chest   Hx of basal cell carcinoma 04/26/2015   right sacral area   Hx of basal cell carcinoma 04/26/2016   left temple   Hypertension     Past Surgical History:  Procedure Laterality Date   ABDOMINAL HYSTERECTOMY     APPENDECTOMY     BACK SURGERY     COLONOSCOPY WITH PROPOFOL N/A 03/07/2017   Procedure: COLONOSCOPY WITH PROPOFOL;  Surgeon: Toledo, Boykin Nearing, MD;  Location: [facility] ENDOSCOPY;  Service: Gastroenterology;  Laterality: N/A;   ESOPHAGOGASTRODUODENOSCOPY N/A 03/07/2017   Procedure: ESOPHAGOGASTRODUODENOSCOPY (EGD);  Surgeon: Toledo, Boykin Nearing, MD;  Location: [facility] ENDOSCOPY;  Service: Gastroenterology;  Laterality: N/A;   FOOT SURGERY Left 05/2016   JOINT REPLACEMENT Bilateral 2010 2011   REPLACEMENT TOTAL KNEE BILATERAL  2010    There were no vitals filed for this visit.   Subjective Assessment - 10/26/22 1203     Subjective  I use to have to open my pocket with my R hand to put L hand in- now can get it easy in - still hard to type on computer - one finger still    Pertinent History Per chart, recent MD note:  Ms. [name] returns today for re-evaluation of her left upper  extremity. She is a 87 y.o. female who I last saw in 07/04/2022. Patient is status post left rTSA with a block with Dr. Olga Millers on 09/23/2021. She states that in PACU she noticed numbness in the radial nerve distribution and was unable to extend her wrist. Since that time she has regained wrist extension. She has not noted improvement in the numbness in the radial nerve distribution. She has had multiple EMG studies which have shown electrodiagnostic evidence of recovery of the radial nerve proper. She returns today for reevaluation. She notes that she has had some new onset swelling at the dorsal aspect of her left hand. She is not sure why this happened. She has not seen any improvement in terms of her finger or thumb function. Examination of the left upper extremity shows she has normal sensation in ulnar and median nerve distributions. She has diminished sensation and pressure perception to light touch in left radial nerve distribution. Skin is intact. Fingers are warm and well perfused with brisk capillary refill and she has a palpable radial pulse. There is no tenderness to palpation throughout. 5/5 triceps. 5/5 brachioradialis. 5/5 left wrist extension through her radial wrist extenders, 0/5 EDC, 0/5 EPL. 0/5 EIP    Patient Stated Goals Pt would like to improve movement and strength of her left  hand to complete daily tasks with greater ease and independence.    Currently in Pain? No/denies            Coming in - place and hold able to maintain [facility] extention of 3rd thru 5th at 0 degrees - but unable to tolerate manual resistance  Patient unable to do resistance of rubber band for [facility] extension Or manual resistance from OT. But able to resist better  Patient to show increased individual digit extension  while opposing to digits.   This date at patient maintaining MC extension with wrist neutral with a light object like 4 ounce putty on dorsal digit maintaining extension 12 reps against gravity Upgrade  to 3/4 lbs cuff weight -sliding hand off table - maintain partial extention  Patient doing MC PIP extension simulating or flipping into 4 ounce putty 2 sets of 12 reps Patient doing opposition with wrist flexion while maintaining at 3 digits and MCP and PIP extension Patient needed mod to max verbal cueing and tactile cueing. Using tenolysis Light blue putty on table patient pushing with digits into extension into putty 2 sets of 15. Patient is monitorable cueing to not compensate with forearm. Still need mod v/c to not compensate    Patient to do these exercises with wall slides in between for composite flexor stretch Cuff weight 3/4 weight on fingers- doing UD and RD and sup/pro  Keeping MC extention as much as she can  2 x 12 reps   Work on when digits extention rolling over putty- keeping 5th into ADD  Provided- 1-4 dots -for pt to reach to =simulate typing - or digits extention to 4 dots  Alternate digits                         OT Education - 10/26/22 1203     Education Details HEP and splint wearing    Person(s) Educated Patient    Methods Explanation;Demonstration;Tactile cues;Verbal cues    Comprehension Verbalized understanding              OT Short Term Goals - 10/12/22 2150       OT SHORT TERM GOAL #1   Title Pt will be independent with HEP for left wrist and hand    Status Achieved               OT Long Term Goals - 10/12/22 2150       OT LONG TERM GOAL #1   Title Pt will demonstrate improve MP extension by 10 degrees to facilitate greater active reach for objects    Baseline MC extention increase for pt to donn glove , put hand in pocket and release objects    Status Achieved      OT LONG TERM GOAL #2   Title Pt will demonstrate improvement of grip by 10# to grasp items without dropping.    Baseline Eval:  left grip limited to 24#, difficulty maintaining grip on objects - NOW increase to 30 lbs -    Time 2    Period Weeks     Status On-going    Target Date 10/25/22      OT LONG TERM GOAL #3   Title Pt will demonstrate improved functional  strength in wrist and hand to operate manual can opener without difficulty.    Status Achieved      OT LONG TERM GOAL #4   Title Pt will demonstrate improved hand function and  coordination to engage in meal preparation tasks with modified independence.    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  She reports afterward she had numbness along the left radial nerve distribution and decreased hand and wrist motion.  Her last EMG study indicated healing over time.  . She presents  at eval with limited active motion in her left hand especially for finger extension at [facility] and thumb PA and RA, decreased strength overall but also decreased grip and pinch strengths with comparison. Pt cont to show increase extention of digits and thumb PA and RA. Show increase grip strength and functional use.  Patient has more ease for putting her hand in her pocket as well as donning a glove of and tying shoes.  Pt this date able to main individual digit extention  when doing opposition to digits. Having trouble maintaining MC extension with wrist extension while reaching  between cylinder objects while maintaining MCextention and palmar abduction.  Place and hold of 3rd thru 5th digit 0 extention now at [facility]. . Pt  wants to see improvements in her left hand function to perform daily tasks with greater ease and independence - one being typing on computer. Able to initiate some strengthening or PIP and MC  extention.  Pt would benefit from skilled OT services to maximize her safety and independence with ADL and IADL tasks at home and in the community.    OT Occupational Profile and History Detailed Assessment- Review of Records and additional review of physical, cognitive, psychosocial history related to current functional performance    Occupational performance deficits (Please refer to evaluation for details): ADL's;IADL's;Leisure    Body Structure / Function / Physical Skills ADL;ROM;UE functional use;FMC;Dexterity;Sensation;Edema;Strength;Coordination;IADL    Psychosocial Skills Habits;Routines and Behaviors;Environmental  Adaptations    Rehab Potential Good    Clinical Decision Making Several treatment options, min-mod task modification necessary    Comorbidities Affecting Occupational Performance: May have comorbidities impacting occupational performance    Modification or Assistance to Complete Evaluation  No modification of tasks or assist necessary to complete eval    OT Frequency 1x / week    OT Duration 4 weeks    OT Treatment/Interventions Self-care/ADL training;Electrical Stimulation;Therapeutic exercise;Coping strategies training;Moist Heat;Paraffin;Neuromuscular education;Splinting;Patient/family education;Fluidtherapy;Therapeutic activities;Cryotherapy;Ultrasound;Contrast Bath;DME and/or AE instruction;Manual Therapy;Passive range of motion    Consulted and Agree with Plan of Care Patient             Patient will benefit from skilled therapeutic intervention in order to improve the following deficits and impairments:   Body Structure / Function / Physical Skills: ADL, ROM, UE functional use, FMC, Dexterity, Sensation, Edema, Strength, Coordination, IADL   Psychosocial Skills: Habits, Routines and Behaviors, Environmental  Adaptations   Visit Diagnosis: Other lack of coordination  Muscle weakness (generalized)  Localized edema  Other disturbances of skin  sensation    Problem List Patient Active Problem List   Diagnosis Date Noted  CVA (cerebral infarction) 11/28/2015    Oletta Cohn, OTR/L,CLT 10/26/2022, 1:09 PM  [facility] [address], [place], [address] Phone: [phone]   Fax:  [phone]  Name: ALIANY GEARHART MRN: [id number] Date of Birth: [birth year]

## 2022-10-31 ENCOUNTER — Encounter: Payer: HMO | Admitting: Physician Assistant

## 2022-10-31 DIAGNOSIS — S81801A Unspecified open wound, right lower leg, initial encounter: Secondary | ICD-10-CM | POA: Diagnosis not present

## 2022-10-31 DIAGNOSIS — L97812 Non-pressure chronic ulcer of other part of right lower leg with fat layer exposed: Secondary | ICD-10-CM | POA: Diagnosis not present

## 2022-10-31 NOTE — Progress Notes (Addendum)
PAYSLEY, VOLKMER R ([id number]) [id number] Page 1 of 7 Visit Report for 10/31/2022 Chief Complaint Document Details Patient Name: Date of Service: [name]. 10/31/2022 10:30 A M Medical Record Number: [id number] Patient Account Number: [id number] Date of Birth/Sex: Treating RN: [birth year] (87 y.o. [name] Primary Care Provider: Clydie Braun Other Clinician: Referring Provider: Treating Provider/Extender: Sydnee Cabal in Treatment: 12 Information Obtained from: Patient Chief Complaint Skin tear right leg 07/24/22 Electronic Signature(s) Signed: 10/31/2022 10:48:07 AM By: Allen Derry PA-C Entered By: Allen Derry on 10/31/2022 10:48:07 -------------------------------------------------------------------------------- HPI Details Patient Name: Date of Service: [name] 10/31/2022 10:30 A M Medical Record Number: [id number] Patient Account Number: [id number] Date of Birth/Sex: Treating RN: 09/10/1935 (87 y.o. [name] Primary Care Provider: Clydie Braun Other Clinician: Referring Provider: Treating Provider/Extender: Sydnee Cabal in Treatment: 12 History of Present Illness HPI Description: 08-08-2022 upon evaluation today patient appears for initial inspection here in our clinic concerning issues that she has been having with a wound on her right anterior lower leg where she dropped a shredder on her leg causing a skin tear. Unfortunately this was attempted to be reapproximated but that has not been extremely effective for her. Fortunately there does not appear to be any signs of infection systemically which is great news. With that being said there is local infection definitely noted at this point. She has been on antibiotics. Currently cefdinir and doxycycline where the 2 antibiotics that were used up to this point. This original injury occurred on March 4. Her culture  showed Enterococcus faecium and that was on 08-06-2022 that was resulted. Her primary care provider Dr. Sampson Goon who is also an infectious disease specialist that stated that if she was not better at the end of her current cefdinir and doxycycline regimen that he would likely put her on Augmentin or amoxicillin. I think we may want to switch her to Augmentin at this point. Patient has a history of hypertension and peripheral vascular disease but really no other major medical problems at this point. With that being said although she did not show any signs of infection at this point I feel like this is something that might heal readily. However because of the screen showing PAD here in the clinic on the ABIs if she does not show signs of improvement over the next that I think we may want to go ahead and see about getting her to vascular. Further evaluation and treatment. Will however see how things do over the next week. 08-15-2022 upon evaluation today patient appears to be doing well currently in regard to her wound. She has been tolerating the dressing changes and overall seems to be doing quite well with regard to her wound I am seeing some definite improvement here. With that being said I do believe that she is making good progress with regard to the Iodoflex which I think is cleaning up the surface of the wound quite well I am pleased in that regard. She has started on the Augmentin currently and seems to be doing very well with that. [name], [name] ([id number]) [id number] Page 2 of 7 08-22-2022 upon evaluation today patient appears to be doing well currently in regard to her wound. She is actually tolerating the dressing changes without complication. Fortunately there does not appear to be any signs of active infection at this time which is great news and overall I do believe that she is significantly improved with  the Augmentin which is excellent as well. 08-29-2022  upon evaluation today patient appears to be doing well currently in regard to her wound which is actually making some pretty good progress here. I am extremely pleased with where we stand I think she is headed in the right direction and in general I do believe that we are making leaps and bounds towards getting this completely closed. With that being said I do think it is time to go ahead and switch over to collagen in place of the Iodoflex I think you will feel more comfortable and I think it will help with additional tissue growth much more effectively at this point. 09-05-2022 upon evaluation today patient appears to be doing well currently in regard to her wound which is actually showing signs of excellent improvement. Fortunately I do not see any evidence of active infection locally nor systemically at this time which is great news. 09-12-2022 upon evaluation today patient appears to be doing well currently in regard to her wound. She has been tolerating the dressing changes without complication and very pleased in that regard. Fortunately there does not appear to be any signs of active infection locally nor systemically which is great news. No fevers, chills, nausea, vomiting, or diarrhea. 09-19-2022 upon evaluation today patient appears to be doing well currently in regard to her wound is actually was measuring smaller, looking better, and overall seems to be an excellent track towards complete closure. Overall I do believe that she is making good progress here. 09-26-2022 upon evaluation today patient appears to be doing well currently in regard to her wound. She has been tolerating the dressing changes for the last week without any complication. Fortunately there does not appear to be any signs of active infection at this time. I do feel like she is making excellent progress here. 10-03-2022 upon evaluation today patient appears to be doing well with regard to her wound. She has been tolerating the  dressing changes without complication. Fortunately there does not appear to be any signs of active infection locally nor systemically at this time. No fevers, chills, nausea, vomiting, or diarrhea. 10-10-2022 upon evaluation patient's wound actually showing signs of significant improvement. With that being said unfortunately she continues to have some issues here with the wrap slipping down which is causing her a lot of distress she is not very comfortable with this at all. I am very pleased with the fact that the wound is however significantly smaller despite the fact her calf is bigger due to the wrap slipping down. 10-19-2022 upon evaluation today patient appears to be doing well currently in regard to her wound. She has been tolerating the dressing changes without complication. Fortunately there does not appear to be any signs of active infection locally nor systemically which is great news. No fevers, chills, nausea, vomiting, or diarrhea. 10-24-2022 upon evaluation today patient's wound is actually showing signs of excellent improvement. I am very pleased with where we stand I think that she has made great progress and in general I do think that we are moving in the right direction here. I do not see any evidence of infection at this point. 11-03-2022 upon evaluation today patient appears to be doing really well currently in regard to her wound which is actually showing signs of excellent improvement in fact this is significantly smaller even compared to last week I think she is very close probably within 2 weeks of complete resolution. Electronic Signature(s) Signed: 11/03/2022 1:53:19 PM By: Larina Bras,  Lucia Mccreadie PA-C Entered By: Allen Derry on 11/03/2022 13:53:19 -------------------------------------------------------------------------------- Physical Exam Details Patient Name: Date of Service: [name]. 10/31/2022 10:30 A M Medical Record Number: [id number] Patient Account Number:  [id number] Date of Birth/Sex: Treating RN: [birth year] (87 y.o. [name] Primary Care Provider: Clydie Braun Other Clinician: Referring Provider: Treating Provider/Extender: Sydnee Cabal in Treatment: 12 Constitutional Well-nourished and well-hydrated in no acute distress. Respiratory normal breathing without difficulty. Psychiatric this patient is able to make decisions and demonstrates good insight into disease process. Alert and Oriented x 3. pleasant and cooperative. Notes Upon inspection patient's wound bed actually showed signs of good granulation epithelization at this point. Fortunately I see no evidence of infection or worsening overall and I think that we are moving in the right direction at this point. KARRYN, WITHEE R ([id number]) [id number] Page 3 of 7 Electronic Signature(s) Signed: 11/03/2022 1:53:33 PM By: Allen Derry PA-C Entered By: Allen Derry on 11/03/2022 13:53:33 -------------------------------------------------------------------------------- Physician Orders Details Patient Name: Date of Service: [name] 10/31/2022 10:30 A M Medical Record Number: [id number] Patient Account Number: [id number] Date of Birth/Sex: Treating RN: [birth year] (87 y.o. [name] Primary Care Provider: Clydie Braun Other Clinician: Referring Provider: Treating Provider/Extender: Sydnee Cabal in Treatment: 12 Verbal / Phone Orders: No Diagnosis Coding ICD-10 Coding Code Description [phone] Laceration without foreign body, right lower leg, initial encounter L97.812 Non-pressure chronic ulcer of other part of right lower leg with fat layer exposed I73.89 Other specified peripheral vascular diseases I10 Essential (primary) hypertension Follow-up Appointments Return Appointment in 1 week. Bathing/ Shower/ Hygiene May shower with wound dressing protected with water  repellent cover or cast protector. No tub bath. Anesthetic (Use 'Patient Medications' Section for Anesthetic Order Entry) Wound #1 Right Lower Leg Lidocaine applied to wound bed Edema Control - Lymphedema / Segmental Compressive Device / Other Tubigrip single layer applied. Elevate, Exercise Daily and A void Standing for Long Periods of Time. Elevate legs to the level of the heart and pump ankles as often as possible Elevate leg(s) parallel to the floor when sitting. DO YOUR BEST to sleep in the bed at night. DO NOT sleep in your recliner. Long hours of sitting in a recliner leads to swelling of the legs and/or potential wounds on your backside. Wound Treatment Wound #1 - Lower Leg Wound Laterality: Right Cleanser: Soap and Water 3 x Per Week/30 Days Discharge Instructions: Gently cleanse wound with antibacterial soap, rinse and pat dry prior to dressing wounds Cleanser: Vashe 5.8 (oz) 3 x Per Week/30 Days Discharge Instructions: Use vashe 5.8 (oz) as directed Prim Dressing: Silvercel Small 2x2 (in/in) 3 x Per Week/30 Days ary Discharge Instructions: Apply Silvercel Small 2x2 (in/in) as instructed Secondary Dressing: (BORDER) Zetuvit Plus SILICONE BORDER Dressing 4x4 (in/in) (Generic) 3 x Per Week/30 Days Discharge Instructions: Please do not put silicone bordered dressings under wraps. Use non-bordered dressing only. Secured With: Tubigrip Size D, 3x10 (in/yd) 3 x Per Week/30 Days Electronic Signature(s) MAIKAYLA, CASADOS R ([id number]) [id number] Page 4 of 7 Signed: 10/31/2022 4:39:57 PM By: Angelina Pih Signed: 10/31/2022 6:26:43 PM By: Allen Derry PA-C Entered By: Angelina Pih on 10/31/2022 11:10:33 -------------------------------------------------------------------------------- Problem List Details Patient Name: Date of Service: Lavell Anchors RA R. 10/31/2022 10:30 A M Medical Record Number: [id number] Patient Account Number: [id number] Date of  Birth/Sex: Treating RN: 03/05/1936 (87 y.o. [name] Primary Care Provider: Clydie Braun Other Clinician: Referring Provider: Treating Provider/Extender: Allen Derry  FItzgerald, Reece Packer in Treatment: 12 Active Problems ICD-10 Encounter Code Description Active Date MDM Diagnosis S81.811A Laceration without foreign body, right lower leg, initial encounter 08/08/2022 No Yes L97.812 Non-pressure chronic ulcer of other part of right lower leg with fat layer 08/08/2022 No Yes exposed I73.89 Other specified peripheral vascular diseases 08/08/2022 No Yes I10 Essential (primary) hypertension 08/08/2022 No Yes Inactive Problems Resolved Problems Electronic Signature(s) Signed: 10/31/2022 10:48:02 AM By: Allen Derry PA-C Entered By: Allen Derry on 10/31/2022 10:48:01 -------------------------------------------------------------------------------- Progress Note Details Patient Name: Date of Service: [name] 10/31/2022 10:30 A Laurin Coder, Dell Ponto R ([id number]) [id number] Page 5 of 7 Medical Record Number: [id number] Patient Account Number: [id number] Date of Birth/Sex: Treating RN: Sep 12, 1935 (87 y.o. [name] Primary Care Provider: Clydie Braun Other Clinician: Referring Provider: Treating Provider/Extender: Sydnee Cabal in Treatment: 12 Subjective Chief Complaint Information obtained from Patient Skin tear right leg 07/24/22 History of Present Illness (HPI) 08-08-2022 upon evaluation today patient appears for initial inspection here in our clinic concerning issues that she has been having with a wound on her right anterior lower leg where she dropped a shredder on her leg causing a skin tear. Unfortunately this was attempted to be reapproximated but that has not been extremely effective for her. Fortunately there does not appear to be any signs of infection systemically which is great news. With that  being said there is local infection definitely noted at this point. She has been on antibiotics. Currently cefdinir and doxycycline where the 2 antibiotics that were used up to this point. This original injury occurred on March 4. Her culture showed Enterococcus faecium and that was on 08-06-2022 that was resulted. Her primary care provider Dr. Sampson Goon who is also an infectious disease specialist that stated that if she was not better at the end of her current cefdinir and doxycycline regimen that he would likely put her on Augmentin or amoxicillin. I think we may want to switch her to Augmentin at this point. Patient has a history of hypertension and peripheral vascular disease but really no other major medical problems at this point. With that being said although she did not show any signs of infection at this point I feel like this is something that might heal readily. However because of the screen showing PAD here in the clinic on the ABIs if she does not show signs of improvement over the next that I think we may want to go ahead and see about getting her to vascular. Further evaluation and treatment. Will however see how things do over the next week. 08-15-2022 upon evaluation today patient appears to be doing well currently in regard to her wound. She has been tolerating the dressing changes and overall seems to be doing quite well with regard to her wound I am seeing some definite improvement here. With that being said I do believe that she is making good progress with regard to the Iodoflex which I think is cleaning up the surface of the wound quite well I am pleased in that regard. She has started on the Augmentin currently and seems to be doing very well with that. 08-22-2022 upon evaluation today patient appears to be doing well currently in regard to her wound. She is actually tolerating the dressing changes without complication. Fortunately there does not appear to be any signs of active  infection at this time which is great news and overall I do believe that she is significantly improved with  the Augmentin which is excellent as well. 08-29-2022 upon evaluation today patient appears to be doing well currently in regard to her wound which is actually making some pretty good progress here. I am extremely pleased with where we stand I think she is headed in the right direction and in general I do believe that we are making leaps and bounds towards getting this completely closed. With that being said I do think it is time to go ahead and switch over to collagen in place of the Iodoflex I think you will feel more comfortable and I think it will help with additional tissue growth much more effectively at this point. 09-05-2022 upon evaluation today patient appears to be doing well currently in regard to her wound which is actually showing signs of excellent improvement. Fortunately I do not see any evidence of active infection locally nor systemically at this time which is great news. 09-12-2022 upon evaluation today patient appears to be doing well currently in regard to her wound. She has been tolerating the dressing changes without complication and very pleased in that regard. Fortunately there does not appear to be any signs of active infection locally nor systemically which is great news. No fevers, chills, nausea, vomiting, or diarrhea. 09-19-2022 upon evaluation today patient appears to be doing well currently in regard to her wound is actually was measuring smaller, looking better, and overall seems to be an excellent track towards complete closure. Overall I do believe that she is making good progress here. 09-26-2022 upon evaluation today patient appears to be doing well currently in regard to her wound. She has been tolerating the dressing changes for the last week without any complication. Fortunately there does not appear to be any signs of active infection at this time. I do feel like  she is making excellent progress here. 10-03-2022 upon evaluation today patient appears to be doing well with regard to her wound. She has been tolerating the dressing changes without complication. Fortunately there does not appear to be any signs of active infection locally nor systemically at this time. No fevers, chills, nausea, vomiting, or diarrhea. 10-10-2022 upon evaluation patient's wound actually showing signs of significant improvement. With that being said unfortunately she continues to have some issues here with the wrap slipping down which is causing her a lot of distress she is not very comfortable with this at all. I am very pleased with the fact that the wound is however significantly smaller despite the fact her calf is bigger due to the wrap slipping down. 10-19-2022 upon evaluation today patient appears to be doing well currently in regard to her wound. She has been tolerating the dressing changes without complication. Fortunately there does not appear to be any signs of active infection locally nor systemically which is great news. No fevers, chills, nausea, vomiting, or diarrhea. 10-24-2022 upon evaluation today patient's wound is actually showing signs of excellent improvement. I am very pleased with where we stand I think that she has made great progress and in general I do think that we are moving in the right direction here. I do not see any evidence of infection at this point. 11-03-2022 upon evaluation today patient appears to be doing really well currently in regard to her wound which is actually showing signs of excellent improvement in fact this is significantly smaller even compared to last week I think she is very close probably within 2 weeks of complete resolution. Objective Constitutional Well-nourished and well-hydrated in no acute distress.  Vitals Time Taken: 10:35 AM, Height: 69 in, Weight: 194 lbs, BMI: 28.6, Temperature: 97.9 F, Pulse: 58 bpm, Respiratory Rate: 20  breaths/min, Blood Pressure: 129/67 mmHg. ZENIYA, ODEGAARD R ([id number]) [id number] Page 6 of 7 Respiratory normal breathing without difficulty. Psychiatric this patient is able to make decisions and demonstrates good insight into disease process. Alert and Oriented x 3. pleasant and cooperative. General Notes: Upon inspection patient's wound bed actually showed signs of good granulation epithelization at this point. Fortunately I see no evidence of infection or worsening overall and I think that we are moving in the right direction at this point. Integumentary (Hair, Skin) Wound #1 status is Open. Original cause of wound was Trauma. The date acquired was: 07/24/2022. The wound has been in treatment 12 weeks. The wound is located on the Right Lower Leg. The wound measures 0.5cm length x 0.6cm width x 0.1cm depth; 0.236cm^2 area and 0.024cm^3 volume. There is Fat Layer (Subcutaneous Tissue) exposed. There is no tunneling or undermining noted. There is a medium amount of serosanguineous drainage noted. There is large (67- 100%) red, pink granulation within the wound bed. There is a small (1-33%) amount of necrotic tissue within the wound bed including Adherent Slough. Assessment Active Problems ICD-10 Laceration without foreign body, right lower leg, initial encounter Non-pressure chronic ulcer of other part of right lower leg with fat layer exposed Other specified peripheral vascular diseases Essential (primary) hypertension Plan Follow-up Appointments: Return Appointment in 1 week. Bathing/ Shower/ Hygiene: May shower with wound dressing protected with water repellent cover or cast protector. No tub bath. Anesthetic (Use 'Patient Medications' Section for Anesthetic Order Entry): Wound #1 Right Lower Leg: Lidocaine applied to wound bed Edema Control - Lymphedema / Segmental Compressive Device / Other: Tubigrip single layer applied. Elevate, Exercise Daily and  Avoid Standing for Long Periods of Time. Elevate legs to the level of the heart and pump ankles as often as possible Elevate leg(s) parallel to the floor when sitting. DO YOUR BEST to sleep in the bed at night. DO NOT sleep in your recliner. Long hours of sitting in a recliner leads to swelling of the legs and/or potential wounds on your backside. WOUND #1: - Lower Leg Wound Laterality: Right Cleanser: Soap and Water 3 x Per Week/30 Days Discharge Instructions: Gently cleanse wound with antibacterial soap, rinse and pat dry prior to dressing wounds Cleanser: Vashe 5.8 (oz) 3 x Per Week/30 Days Discharge Instructions: Use vashe 5.8 (oz) as directed Prim Dressing: Silvercel Small 2x2 (in/in) 3 x Per Week/30 Days ary Discharge Instructions: Apply Silvercel Small 2x2 (in/in) as instructed Secondary Dressing: (BORDER) Zetuvit Plus SILICONE BORDER Dressing 4x4 (in/in) (Generic) 3 x Per Week/30 Days Discharge Instructions: Please do not put silicone bordered dressings under wraps. Use non-bordered dressing only. Secured With: Tubigrip Size D, 3x10 (in/yd) 3 x Per Week/30 Days 1. I am good recommend currently that we have the patient going continue to utilize the silver cell which I think is doing well for her. 2. Will continue with the bordered foam dressing to cover. 3. Will also continue with the Tubigrip this seems to be doing quite well as well. We will see patient back for reevaluation in 1 week here in the clinic. If anything worsens or changes patient will contact our office for additional recommendations. Electronic Signature(s) Signed: 11/03/2022 1:53:58 PM By: Allen Derry PA-C Entered By: Allen Derry on 11/03/2022 13:53:58 Amra Boys R ([id number]) [id number] Page 7 of 7 -------------------------------------------------------------------------------- SuperBill Details Patient Name: Date of  Service: Lisette Grinder, Linwood Dibbles RA R. 10/31/2022 Medical Record Number:  [id number] Patient Account Number: [id number] Date of Birth/Sex: Treating RN: [birth year] (87 y.o. [name] Primary Care Provider: Clydie Braun Other Clinician: Referring Provider: Treating Provider/Extender: Sydnee Cabal in Treatment: 12 Diagnosis Coding ICD-10 Codes Code Description [phone] Laceration without foreign body, right lower leg, initial encounter L97.812 Non-pressure chronic ulcer of other part of right lower leg with fat layer exposed I73.89 Other specified peripheral vascular diseases I10 Essential (primary) hypertension Facility Procedures : CPT4 Code: [id number] Description: 99213 - WOUND CARE VISIT-LEV 3 EST PT Modifier: Quantity: 1 Physician Procedures : CPT4 Code Description Modifier [id number] - WC PHYS LEVEL 3 - EST PT ICD-10 Diagnosis Description S81.811A Laceration without foreign body, right lower leg, initial encounter L97.812 Non-pressure chronic ulcer of other part of right lower leg with  fat layer exposed I73.89 Other specified peripheral vascular diseases I10 Essential (primary) hypertension Quantity: 1 Electronic Signature(s) Signed: 10/31/2022 6:19:05 PM By: Allen Derry PA-C Previous Signature: 10/31/2022 4:39:57 PM Version By: Angelina Pih Entered By: Allen Derry on 10/31/2022 18:19:05

## 2022-11-01 NOTE — Progress Notes (Signed)
SAREN, HOUDESHELL [name] ([id number]) [id number] Page 1 of 7 Visit Report for 10/31/2022 Arrival Information Details Patient Name: Date of Service: [name]. 10/31/2022 10:30 A M Medical Record Number: [id number] Patient Account Number: [id number] Date of Birth/Sex: Treating RN: 12-Apr-1936 (87 y.o. [name] Primary Care Mckala Pantaleon: Clydie Braun Other Clinician: Referring Ceira Hoeschen: Treating Jamarques Pinedo/Extender: Sydnee Cabal in Treatment: 12 Visit Information History Since Last Visit Added or deleted any medications: No Patient Arrived: Ambulatory Any new allergies or adverse reactions: No Arrival Time: 10:35 Had a fall or experienced change in No Accompanied By: self activities of daily living that may affect Transfer Assistance: None risk of falls: Patient Identification Verified: Yes Hospitalized since last visit: No Secondary Verification Process Completed: Yes Has Dressing in Place as Prescribed: Yes Patient Has Alerts: Yes Pain Present Now: No Patient Alerts: Patient on Blood Thinner 08/08/22 ABI Right PAD 08/08/22 ABI Left PAD Electronic Signature(s) Signed: 10/31/2022 4:39:57 PM By: Angelina Pih Entered By: Angelina Pih on 10/31/2022 10:35:56 -------------------------------------------------------------------------------- Clinic Level of Care Assessment Details Patient Name: Date of Service: [name]. 10/31/2022 10:30 A M Medical Record Number: [id number] Patient Account Number: [id number] Date of Birth/Sex: Treating RN: 03/18/1936 (87 y.o. [name] Primary Care Sherilynn Dieu: Clydie Braun Other Clinician: Referring Ehsan Corvin: Treating Telly Broberg/Extender: Sydnee Cabal in Treatment: 12 Clinic Level of Care Assessment Items TOOL 4 Quantity Score []  - 0 Use when only an EandM is performed on FOLLOW-UP visit ASSESSMENTS - Nursing Assessment / Reassessment X-  1 10 Reassessment of Co-morbidities (includes updates in patient status) X- 1 5 Reassessment of Adherence to Treatment Plan ASSESSMENTS - Wound and Skin A ssessment / Reassessment X - Simple Wound Assessment / Reassessment - one wound 1 5 []  - 0 Complex Wound Assessment / Reassessment - multiple wounds []  - 0 Dermatologic / Skin Assessment (not related to wound area) ASSESSMENTS - Focused Assessment []  - 0 Circumferential Edema Measurements - multi extremities []  - 0 Nutritional Assessment / Counseling / Intervention []  - 0 Lower Extremity Assessment (monofilament, tuning fork, pulses) []  - 0 Peripheral Arterial Disease Assessment (using hand held doppler) ASSESSMENTS - Ostomy and/or Continence Assessment and Care []  - 0 Incontinence Assessment and Management []  - 0 Ostomy Care Assessment and Management (repouching, etc.) PROCESS - Coordination of Care X - Simple Patient / Family Education for ongoing care 1 15 []  - 0 Complex (extensive) Patient / Family Education for ongoing care [name], [name] ([id number]) [id number] Page 2 of 7 X- 1 10 Staff obtains Consents, Records, T Results / Process Orders est []  - 0 Staff telephones HHA, Nursing Homes / Clarify orders / etc []  - 0 Routine Transfer to another Facility (non-emergent condition) []  - 0 Routine Hospital Admission (non-emergent condition) []  - 0 New Admissions / Manufacturing engineer / Ordering NPWT Apligraf, etc. , []  - 0 Emergency Hospital Admission (emergent condition) X- 1 10 Simple Discharge Coordination []  - 0 Complex (extensive) Discharge Coordination PROCESS - Special Needs []  - 0 Pediatric / Minor Patient Management []  - 0 Isolation Patient Management []  - 0 Hearing / Language / Visual special needs []  - 0 Assessment of Community assistance (transportation, D/C planning, etc.) []  - 0 Additional assistance / Altered mentation []  - 0 Support Surface(s) Assessment  (bed, cushion, seat, etc.) INTERVENTIONS - Wound Cleansing / Measurement X - Simple Wound Cleansing - one wound 1 5 []  - 0 Complex Wound Cleansing - multiple wounds X- 1 5  Wound Imaging (photographs - any number of wounds) []  - 0 Wound Tracing (instead of photographs) X- 1 5 Simple Wound Measurement - one wound []  - 0 Complex Wound Measurement - multiple wounds INTERVENTIONS - Wound Dressings X - Small Wound Dressing one or multiple wounds 1 10 []  - 0 Medium Wound Dressing one or multiple wounds []  - 0 Large Wound Dressing one or multiple wounds X- 1 5 Application of Medications - topical []  - 0 Application of Medications - injection INTERVENTIONS - Miscellaneous []  - 0 External ear exam []  - 0 Specimen Collection (cultures, biopsies, blood, body fluids, etc.) []  - 0 Specimen(s) / Culture(s) sent or taken to Lab for analysis []  - 0 Patient Transfer (multiple staff / Nurse, adult / Similar devices) []  - 0 Simple Staple / Suture removal (25 or less) []  - 0 Complex Staple / Suture removal (26 or more) []  - 0 Hypo / Hyperglycemic Management (close monitor of Blood Glucose) []  - 0 Ankle / Brachial Index (ABI) - do not check if billed separately X- 1 5 Vital Signs Has the patient been seen at the hospital within the last three years: Yes Total Score: 90 Level Of Care: New/Established - Level 3 Electronic Signature(s) Signed: 10/31/2022 4:39:57 PM By: Angelina Pih Entered By: Angelina Pih on 10/31/2022 11:11:45 [name] ([id number]) [id number] Page 3 of 7 -------------------------------------------------------------------------------- Encounter Discharge Information Details Patient Name: Date of Service: [name]. 10/31/2022 10:30 A M Medical Record Number: [id number] Patient Account Number: [id number] Date of Birth/Sex: Treating RN: 07/31/1935 (87 y.o. [name] Primary Care Natara Monfort: Clydie Braun Other  Clinician: Referring Amillion Macchia: Treating Tila Millirons/Extender: Sydnee Cabal in Treatment: 12 Encounter Discharge Information Items Discharge Condition: Stable Ambulatory Status: Ambulatory Discharge Destination: Home Transportation: Private Auto Accompanied By: self Schedule Follow-up Appointment: Yes Clinical Summary of Care: Electronic Signature(s) Signed: 10/31/2022 4:39:57 PM By: Angelina Pih Entered By: Angelina Pih on 10/31/2022 11:12:43 -------------------------------------------------------------------------------- Lower Extremity Assessment Details Patient Name: Date of Service: [name]. 10/31/2022 10:30 A M Medical Record Number: [id number] Patient Account Number: [id number] Date of Birth/Sex: Treating RN: 05/24/1935 (87 y.o. [name] Primary Care Barbara Keng: Clydie Braun Other Clinician: Referring Mariska Daffin: Treating Idris Edmundson/Extender: Sydnee Cabal in Treatment: 12 Edema Assessment Assessed: Kyra Searles: No] Franne Forts: No] Edema: [Left: Ye] [Right: s] Calf Left: Right: Point of Measurement: 34 cm From Medial Instep 38.8 cm Ankle Left: Right: Point of Measurement: 11 cm From Medial Instep 24.5 cm Vascular Assessment Pulses: Dorsalis Pedis Palpable: [Right:Yes] Electronic Signature(s) Signed: 10/31/2022 4:39:57 PM By: Angelina Pih Entered By: Angelina Pih on 10/31/2022 10:43:26 -------------------------------------------------------------------------------- Multi Wound Chart Details Patient Name: Date of Service: [name], [name]. 10/31/2022 10:30 A M Medical Record Number: [id number] Patient Account Number: [id number] Date of Birth/Sex: Treating RN: 07-30-1935 (87 y.o. [name] Primary Care Hadlea Furuya: Clydie Braun Other Clinician: Referring Lavanda Nevels: Treating Varsha Knock/Extender: Sydnee Cabal in Treatment: [address] VERBENA, BOEDING [name] ([id number])  [id number] Page 4 of 7 Height(in): 69 Pulse(bpm): 58 Weight(lbs): 194 Blood Pressure(mmHg): 129/67 Body Mass Index(BMI): 28.6 Temperature(F): 97.9 Respiratory Rate(breaths/min): 20 [1:Photos:] [N/A:N/A] Right Lower Leg N/A N/A Wound Location: Trauma N/A N/A Wounding Event: Skin Tear N/A N/A Primary Etiology: Anemia, Hypertension, Osteoarthritis N/A N/A Comorbid History: 07/24/2022 N/A N/A Date Acquired: 12 N/A N/A Weeks of Treatment: Open N/A N/A Wound Status: No N/A N/A Wound Recurrence: 0.5x0.6x0.1 N/A N/A Measurements L x W x D (cm) 0.236 N/A  N/A A (cm) : rea 0.024 N/A N/A Volume (cm) : 96.70% N/A N/A % Reduction in A rea: 96.60% N/A N/A % Reduction in Volume: Full Thickness Without Exposed N/A N/A Classification: Support Structures Medium N/A N/A Exudate A mount: Serosanguineous N/A N/A Exudate Type: red, brown N/A N/A Exudate Color: Large (67-100%) N/A N/A Granulation A mount: Red, Pink N/A N/A Granulation Quality: Small (1-33%) N/A N/A Necrotic A mount: Fat Layer (Subcutaneous Tissue): Yes N/A N/A Exposed Structures: Medium (34-66%) N/A N/A Epithelialization: Treatment Notes Electronic Signature(s) Signed: 10/31/2022 4:39:57 PM By: Angelina Pih Entered By: Angelina Pih on 10/31/2022 11:01:37 -------------------------------------------------------------------------------- Multi-Disciplinary Care Plan Details Patient Name: Date of Service: [name], [name]. 10/31/2022 10:30 A M Medical Record Number: [id number] Patient Account Number: [id number] Date of Birth/Sex: Treating RN: 1935/06/03 (87 y.o. [name] Primary Care Akshay Spang: Clydie Braun Other Clinician: Referring Bishoy Cupp: Treating Maverik Foot/Extender: Sydnee Cabal in Treatment: 12 Active Inactive Wound/Skin Impairment Nursing Diagnoses: Impaired tissue integrity Knowledge deficit related to ulceration/compromised skin  integrity Goals: Ulcer/skin breakdown will have a volume reduction of 30% by week 4 Date Initiated: 08/08/2022 Date Inactivated: 09/05/2022 Target Resolution Date: 09/05/2022 Unmet Reason: slow wound healing, is Goal Status: Unmet improving Ulcer/skin breakdown will have a volume reduction of 50% by week 8 [name], [name] ([id number]) [id number] Page 5 of 7 Date Initiated: 08/08/2022 Date Inactivated: 10/03/2022 Target Resolution Date: 10/03/2022 Unmet Reason: slow healing, wound Goal Status: Unmet improving, swelling to RLL Ulcer/skin breakdown will have a volume reduction of 80% by week 12 Date Initiated: 08/08/2022 Date Inactivated: 10/31/2022 Target Resolution Date: 10/31/2022 Goal Status: Met Ulcer/skin breakdown will heal within 14 weeks Date Initiated: 08/08/2022 Target Resolution Date: 11/14/2022 Goal Status: Active Interventions: Assess patient/caregiver ability to obtain necessary supplies Assess patient/caregiver ability to perform ulcer/skin care regimen upon admission and as needed Assess ulceration(s) every visit Provide education on ulcer and skin care Treatment Activities: Skin care regimen initiated : 08/08/2022 Notes: Electronic Signature(s) Signed: 10/31/2022 4:39:57 PM By: Angelina Pih Entered By: Angelina Pih on 10/31/2022 11:12:03 -------------------------------------------------------------------------------- Pain Assessment Details Patient Name: Date of Service: [name]. 10/31/2022 10:30 A M Medical Record Number: [id number] Patient Account Number: [id number] Date of Birth/Sex: Treating RN: 1936/04/06 (87 y.o. [name] Primary Care Zakyla Tonche: Clydie Braun Other Clinician: Referring Kaila Devries: Treating Shane Melby/Extender: Sydnee Cabal in Treatment: 12 Active Problems Location of Pain Severity and Description of Pain Patient Has Paino No Site Locations Rate the pain. Current  Pain Level: 0 Pain Management and Medication Current Pain Management: Electronic Signature(s) Signed: 10/31/2022 4:39:57 PM By: Angelina Pih Entered By: Angelina Pih on 10/31/2022 10:38:09 -------------------------------------------------------------------------------- Patient/Caregiver Education Details Patient Name: Date of Service: [address] [name]. 6/11/2024andnbsp10:30 A M Bouffard, Delle Reining ([id number]) [id number] Page 6 of 7 Medical Record Number: [id number] Patient Account Number: [id number] Date of Birth/Gender: Treating RN: 1935/06/15 (87 y.o. [name] Primary Care Physician: Clydie Braun Other Clinician: Referring Physician: Treating Physician/Extender: Sydnee Cabal in Treatment: 12 Education Assessment Education Provided To: Patient Education Topics Provided Wound/Skin Impairment: Handouts: Caring for Your Ulcer Methods: Explain/Verbal Responses: State content correctly Electronic Signature(s) Signed: 10/31/2022 4:39:57 PM By: Angelina Pih Entered By: Angelina Pih on 10/31/2022 11:12:17 -------------------------------------------------------------------------------- Wound Assessment Details Patient Name: Date of Service: [name]. 10/31/2022 10:30 A M Medical Record Number: [id number] Patient Account Number: [id number] Date of Birth/Sex: Treating RN: 07-31-1935 (87 y.o. [name] Primary Care Cydne Grahn: Clydie Braun  Other Clinician: Referring Kaileen Bronkema: Treating Alynn Ellithorpe/Extender: Sydnee Cabal in Treatment: 12 Wound Status Wound Number: 1 Primary Etiology: Skin Tear Wound Location: Right Lower Leg Wound Status: Open Wounding Event: Trauma Comorbid History: Anemia, Hypertension, Osteoarthritis Date Acquired: 07/24/2022 Weeks Of Treatment: 12 Clustered Wound: No Photos Wound Measurements Length: (cm) 0.5 Width: (cm) 0.6 Depth: (cm)  0.1 Area: (cm) 0.236 Volume: (cm) 0.024 % Reduction in Area: 96.7% % Reduction in Volume: 96.6% Epithelialization: Medium (34-66%) Tunneling: No Undermining: No Wound Description Classification: Full Thickness Without Exposed Suppor Exudate Amount: Medium Exudate Type: Serosanguineous Exudate Color: red, brown t Structures Foul Odor After Cleansing: No Slough/Fibrino Yes Wound Bed Granulation Amount: Large (67-100%) Exposed [name], [name] ([id number]) [id number] Page 7 of 7 Granulation Quality: Red, Pink Fat Layer (Subcutaneous Tissue) Exposed: Yes Necrotic Amount: Small (1-33%) Necrotic Quality: Adherent Slough Treatment Notes Wound #1 (Lower Leg) Wound Laterality: Right Cleanser Soap and Water Discharge Instruction: Gently cleanse wound with antibacterial soap, rinse and pat dry prior to dressing wounds Vashe 5.8 (oz) Discharge Instruction: Use vashe 5.8 (oz) as directed Peri-Wound Care Topical Primary Dressing Silvercel Small 2x2 (in/in) Discharge Instruction: Apply Silvercel Small 2x2 (in/in) as instructed Secondary Dressing (BORDER) Zetuvit Plus SILICONE BORDER Dressing 4x4 (in/in) Discharge Instruction: Please do not put silicone bordered dressings under wraps. Use non-bordered dressing only. Secured With Tubigrip Size D, 3x10 (in/yd) Compression Wrap Compression Stockings Add-Ons Electronic Signature(s) Signed: 10/31/2022 4:39:57 PM By: Angelina Pih Entered By: Angelina Pih on 10/31/2022 10:43:05 -------------------------------------------------------------------------------- Vitals Details Patient Name: Date of Service: [name], [name]. 10/31/2022 10:30 A M Medical Record Number: [id number] Patient Account Number: [id number] Date of Birth/Sex: Treating RN: 1935-08-31 (87 y.o. [name] Primary Care Tajae Rybicki: Clydie Braun Other Clinician: Referring Joshual Terrio: Treating Mckyle Solanki/Extender: Sydnee Cabal in Treatment: 12 Vital Signs Time Taken: 10:35 Temperature (F): 97.9 Height (in): 69 Pulse (bpm): 58 Weight (lbs): 194 Respiratory Rate (breaths/min): 20 Body Mass Index (BMI): 28.6 Blood Pressure (mmHg): 129/67 Reference Range: 80 - 120 mg / dl Electronic Signature(s) Signed: 10/31/2022 4:39:57 PM By: Angelina Pih Entered By: Angelina Pih on 10/31/2022 10:38:03

## 2022-11-02 ENCOUNTER — Ambulatory Visit: Payer: HMO | Admitting: Occupational Therapy

## 2022-11-02 DIAGNOSIS — R600 Localized edema: Secondary | ICD-10-CM

## 2022-11-02 DIAGNOSIS — R208 Other disturbances of skin sensation: Secondary | ICD-10-CM

## 2022-11-02 DIAGNOSIS — R278 Other lack of coordination: Secondary | ICD-10-CM

## 2022-11-02 DIAGNOSIS — M6281 Muscle weakness (generalized): Secondary | ICD-10-CM

## 2022-11-02 NOTE — Therapy (Signed)
[facility] [address], [place], [address] Phone: [phone]   Fax:  [phone]  Occupational Therapy Treatment  Patient Details  Name: BONNY BELIN MRN: [id number] Date of Birth: November 14, 1935 No data recorded  Encounter Date: 11/02/2022   OT End of Session - 11/02/22 1404     Visit Number 9    Number of Visits 13    Date for OT Re-Evaluation 12/28/22    OT Start Time 1120    OT Stop Time 1200    OT Time Calculation (min) 40 min    Activity Tolerance Patient tolerated treatment well    Behavior During Therapy [facility] for tasks assessed/performed             Past Medical History:  Diagnosis Date   Arthritis    GERD (gastroesophageal reflux disease)    Hx of basal cell carcinoma 01/13/2013   left parasternal chest   Hx of basal cell carcinoma 04/26/2015   right sacral area   Hx of basal cell carcinoma 04/26/2016   left temple   Hypertension     Past Surgical History:  Procedure Laterality Date   ABDOMINAL HYSTERECTOMY     APPENDECTOMY     BACK SURGERY     COLONOSCOPY WITH PROPOFOL N/A 03/07/2017   Procedure: COLONOSCOPY WITH PROPOFOL;  Surgeon: Toledo, Boykin Nearing, MD;  Location: [facility] ENDOSCOPY;  Service: Gastroenterology;  Laterality: N/A;   ESOPHAGOGASTRODUODENOSCOPY N/A 03/07/2017   Procedure: ESOPHAGOGASTRODUODENOSCOPY (EGD);  Surgeon: Toledo, Boykin Nearing, MD;  Location: [facility] ENDOSCOPY;  Service: Gastroenterology;  Laterality: N/A;   FOOT SURGERY Left 05/2016   JOINT REPLACEMENT Bilateral 2010 2011   REPLACEMENT TOTAL KNEE BILATERAL  2010    There were no vitals filed for this visit.   Subjective Assessment - 11/02/22 1358     Subjective  My hands like 100% better.  I can use it and most everything.  I can do buttons now. I can tie my shoes.  See my daughter-in-law the other day and she was really impressed.    Pertinent History Per chart, recent MD note:  Ms. Plunk returns today for  re-evaluation of her left upper extremity. She is a 87 y.o. female who I last saw in 07/04/2022. Patient is status post left rTSA with a block with Dr. Olga Millers on 09/23/2021. She states that in PACU she noticed numbness in the radial nerve distribution and was unable to extend her wrist. Since that time she has regained wrist extension. She has not noted improvement in the numbness in the radial nerve distribution. She has had multiple EMG studies which have shown electrodiagnostic evidence of recovery of the radial nerve proper. She returns today for reevaluation. She notes that she has had some new onset swelling at the dorsal aspect of her left hand. She is not sure why this happened. She has not seen any improvement in terms of her finger or thumb function. Examination of the left upper extremity shows she has normal sensation in ulnar and median nerve distributions. She has diminished sensation and pressure perception to light touch in left radial nerve distribution. Skin is intact. Fingers are warm and well perfused with brisk capillary refill and she has a palpable radial pulse. There is no tenderness to palpation throughout. 5/5 triceps. 5/5 brachioradialis. 5/5 left wrist extension through her radial wrist extenders, 0/5 EDC, 0/5 EPL. 0/5 EIP    Patient Stated Goals Pt would like to improve movement and strength of  her left hand to complete daily tasks with greater ease and independence.    Currently in Pain? No/denies                          OT Treatments/Exercises (OP) - 11/02/22 0001       LUE Contrast Bath   Time 8 minutes    Comments decrease edema prior to [facility]             Coming in - place and hold able to maintain [facility] extention of 3rd thru 5th at 0 degrees - but unable to tolerate manual resistance   Patient unable to do resistance of rubber band for [facility] extension Or manual resistance from OT. But able to resist better  Patient to show increased individual digit  extension  while opposing to digits.   This date at patient maintaining MC extension with wrist neutral with a light object like 4 ounce putty on dorsal digit maintaining extension 12 reps against gravity Cont with 3/4 lbs cuff weight -sliding hand off table - maintain partial extention  Patient doing MC PIP extension simulating or flipping into 4 ounce putty 2 sets of 12 reps Patient doing opposition with wrist flexion while maintaining at 3 digits and MCP and PIP extension Patient needed mod to max verbal cueing and tactile cueing. Using tenolysis Light blue putty on table patient pushing with digits into extension into putty 2 sets of 15. Patient is monitorable cueing to not compensate with forearm. Still need mod v/c to not compensate    Patient to do these exercises with wall slides in between for composite flexor stretch Cuff weight 3/4 weight on fingers- doing UD and RD and sup/pro  Keeping MC extention as much as she can  2 x 12 reps    Work on when digits extention rolling over putty- keeping 5th into ADD  Cont - 1-4 dots -for pt to reach to =simulate typing - or digits extention to 4 dots  Alternate digits  This date pt report able to do buttons, open packages, zip lock bag  But typing on computer- hard  And pick up 3 small objects from fingers <> palm <> fingers- 3 objects one at time release  from storing in palm  Add to HEP  And pick up alternate digits - opposition -and keeping other digits in extention And 2 cm by 2 cm object from fingers <> palm <> fingers  12 reps  Fitted with CMC neoprene splint - pt report numbness in thumb - CMC ADD in palm - causing CTS Pt to use during day with activities and during [facility]        OT Education - 11/02/22 1404     Education Details HEP and splint wearing    Person(s) Educated Patient    Methods Explanation;Demonstration;Tactile cues;Verbal cues    Comprehension Verbalized understanding              OT Short Term Goals -  11/02/22 1409       OT SHORT TERM GOAL #1   Title Pt will be independent with HEP for left wrist and hand    Status Achieved               OT Long Term Goals - 11/02/22 1410       OT LONG TERM GOAL #1   Title Pt will demonstrate improve MP extension by 10 degrees to facilitate greater active reach for objects  Baseline MC extention increase for pt to donn glove , put hand in pocket and release objects    Status Achieved      OT LONG TERM GOAL #2   Title Pt will demonstrate improvement of grip by 10# to grasp items without dropping.    Baseline Eval:  left grip limited to 24#, difficulty maintaining grip on objects - NOW increase to 33 lbs -    Time 8    Period Weeks    Status On-going    Target Date 12/28/22      OT LONG TERM GOAL #3   Title Pt will demonstrate improved functional  strength in wrist and hand to operate manual can opener without difficulty.    Status Achieved      OT LONG TERM GOAL #4   Title Pt will demonstrate improved hand function and coordination to engage in meal preparation tasks with modified independence.    Baseline Eval:  difficulty with kitchen tasks such as cracking an egg, holding pots  NOW Can do , open package, cut with scissors, open jars    Status Achieved      OT LONG TERM GOAL #5   Title Pt able to type on computer 2-5 min with more ease    Baseline Difficulty with typing on computer - pecking still with one finger- coordination decrease ind extention out of PIP and MC -    Time 8    Period Weeks    Status New    Target Date 12/28/22                   Plan - 11/02/22 1405     Clinical Impression Statement Pt is a 87 yo female who had a fall in Sept 2022 with a traumatic tear repair to her shoulder, she then underwent surgery on Sep 23, 2021 for a reverse shoulder 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  She reports afterward she had numbness along the left radial nerve distribution and decreased hand and wrist motion.  Her last EMG  study indicated healing over time.  . She presents  at eval with limited active motion in her left hand especially for finger extension at [facility] and thumb PA and RA, decreased strength overall but also decreased grip and pinch strengths with comparison. Pt showed increase extention of digits and thumb PA and RA. Increase grip strength and functional use with able to do buttons, shoelaces, carry and lift 8 lbs, push and pull heavy door open-  putting her hand in her pocket as well as donning a glove and reach with PA inbetween objects to grasp. Also able to main individual digit extention  when doing opposition to digits. Having trouble maintaining MC extension with wrist extension. and tapping of digits. Typing still hard and [facility] manipulation of small objects palm<> finger tips. Pt  wants to see improvements in her left hand function to perform daily tasks with greater ease and independence - one being typing on computer. Focusing on strengthening [facility] extention.  Pt would benefit from skilled OT services to maximize her safety and independence with ADL and IADL tasks at home and in the community.    OT Occupational Profile and History Detailed Assessment- Review of Records and additional review of physical, cognitive, psychosocial history related to current functional performance    Occupational performance deficits (Please refer to evaluation for details): ADL's;IADL's;Leisure    Body Structure / Function / Physical Skills ADL;ROM;UE functional use;FMC;Dexterity;Sensation;Edema;Strength;Coordination;IADL    Psychosocial Skills  Habits;Routines and Behaviors;Environmental  Adaptations    Rehab Potential Good    Clinical Decision Making Several treatment options, min-mod task modification necessary    Comorbidities Affecting Occupational Performance: May have comorbidities impacting occupational performance    Modification or Assistance to Complete Evaluation  No modification of tasks or assist necessary to  complete eval    OT Frequency Biweekly    OT Duration 8 weeks    OT Treatment/Interventions Self-care/ADL training;Electrical Stimulation;Therapeutic exercise;Coping strategies training;Moist Heat;Paraffin;Neuromuscular education;Splinting;Patient/family education;Fluidtherapy;Therapeutic activities;Cryotherapy;Ultrasound;Contrast Bath;DME and/or AE instruction;Manual Therapy;Passive range of motion    Consulted and Agree with Plan of Care Patient             Patient will benefit from skilled therapeutic intervention in order to improve the following deficits and impairments:   Body Structure / Function / Physical Skills: ADL, ROM, UE functional use, FMC, Dexterity, Sensation, Edema, Strength, Coordination, IADL   Psychosocial Skills: Habits, Routines and Behaviors, Environmental  Adaptations   Visit Diagnosis: Other lack of coordination  Localized edema  Other disturbances of skin sensation  Muscle weakness (generalized)    Problem List Patient Active Problem List   Diagnosis Date Noted   CVA (cerebral infarction) 11/28/2015    Oletta Cohn, OTR/L,CLT 11/02/2022, 2:14 PM  [facility] [address], [place], [address] Phone: [phone]   Fax:  [phone]  Name: LATRELLE FUSTON MRN: [id number] Date of Birth: [birth year]

## 2022-11-07 ENCOUNTER — Encounter: Payer: HMO | Admitting: Physician Assistant

## 2022-11-07 DIAGNOSIS — I7389 Other specified peripheral vascular diseases: Secondary | ICD-10-CM | POA: Diagnosis not present

## 2022-11-07 DIAGNOSIS — L97812 Non-pressure chronic ulcer of other part of right lower leg with fat layer exposed: Secondary | ICD-10-CM | POA: Diagnosis not present

## 2022-11-07 DIAGNOSIS — I10 Essential (primary) hypertension: Secondary | ICD-10-CM | POA: Diagnosis not present

## 2022-11-07 NOTE — Progress Notes (Addendum)
AXEL, [name] ([id number]) [id number] Page 1 of 8 Visit Report for 11/07/2022 Arrival Information Details Patient Name: Date of Service: [name] [name]. 11/07/2022 10:30 A M Medical Record Number: [id number] Patient Account Number: [id number] Date of Birth/Sex: Treating RN: May 30, 1935 (87 y.o. [name] Primary Care [name]: [name] Other Clinician: Referring [name]: Treating [name]/Extender: [name] in [name]: 13 Visit Information History Since Last Visit Added or deleted any medications: No Patient Arrived: Ambulatory Any new allergies or adverse reactions: No Arrival Time: 10:33 Had a fall or experienced change in No Accompanied By: self activities of daily living that may affect Transfer Assistance: None risk of falls: Patient Identification Verified: Yes Hospitalized since last visit: No Secondary Verification Process Completed: Yes Has Dressing in Place as Prescribed: Yes Patient Has Alerts: Yes Has Compression in Place as Prescribed: Yes Patient Alerts: Patient on Blood Thinner Pain Present Now: No 08/08/22 ABI Right PAD 08/08/22 ABI Left PAD Electronic Signature(s) Signed: 11/07/2022 3:28:11 PM By: Angelina Pih Entered By: Angelina Pih on 11/07/2022 10:33:58 -------------------------------------------------------------------------------- Clinic Level of Care Assessment Details Patient Name: Date of Service: Brooke Huge. 11/07/2022 10:30 A M Medical Record Number: [id number] Patient Account Number: [id number] Date of Birth/Sex: Treating RN: 1935-06-07 (87 y.o. [name] Primary Care [name]: [name] Other Clinician: Referring Saharah Sherrow: Treating [name]/Extender: [name] in [name]: 13 Clinic Level of Care Assessment Items TOOL 4 Quantity Score []  - 0 Use when only an EandM is performed on FOLLOW-UP  visit ASSESSMENTS - Nursing Assessment / Reassessment X- 1 10 Reassessment of Co-morbidities (includes updates in patient status) X- 1 5 Reassessment of Adherence to [name] Plan ASSESSMENTS - Wound and Skin A ssessment / Reassessment X - Simple Wound Assessment / Reassessment - one wound 1 5 [name], [name] ([id number]) [id number] Page 2 of 8 []  - 0 Complex Wound Assessment / Reassessment - multiple wounds []  - 0 Dermatologic / Skin Assessment (not related to wound area) ASSESSMENTS - Focused Assessment []  - 0 Circumferential Edema Measurements - multi extremities []  - 0 Nutritional Assessment / Counseling / Intervention []  - 0 Lower Extremity Assessment (monofilament, tuning fork, pulses) []  - 0 Peripheral Arterial Disease Assessment (using hand held doppler) ASSESSMENTS - Ostomy and/or Continence Assessment and Care []  - 0 Incontinence Assessment and Management []  - 0 Ostomy Care Assessment and Management (repouching, etc.) PROCESS - Coordination of Care X - Simple Patient / Family Education for ongoing care 1 15 []  - 0 Complex (extensive) Patient / Family Education for ongoing care X- 1 10 Staff obtains Chiropractor, Records, T Results / Process Orders est []  - 0 Staff telephones HHA, Nursing Homes / Clarify orders / etc []  - 0 Routine Transfer to another Facility (non-emergent condition) []  - 0 Routine Hospital Admission (non-emergent condition) []  - 0 New Admissions / Manufacturing engineer / Ordering NPWT Apligraf, etc. , []  - 0 Emergency Hospital Admission (emergent condition) X- 1 10 Simple Discharge Coordination []  - 0 Complex (extensive) Discharge Coordination PROCESS - Special Needs []  - 0 Pediatric / Minor Patient Management []  - 0 Isolation Patient Management []  - 0 Hearing / Language / Visual special needs []  - 0 Assessment of Community assistance (transportation, D/C planning, etc.) []  - 0 Additional assistance /  Altered mentation []  - 0 Support Surface(s) Assessment (bed, cushion, seat, etc.) INTERVENTIONS - Wound Cleansing / Measurement X - Simple Wound Cleansing - one wound 1 5 []  - 0 Complex Wound  Cleansing - multiple wounds X- 1 5 Wound Imaging (photographs - any number of wounds) []  - 0 Wound Tracing (instead of photographs) X- 1 5 Simple Wound Measurement - one wound []  - 0 Complex Wound Measurement - multiple wounds INTERVENTIONS - Wound Dressings []  - 0 Small Wound Dressing one or multiple wounds []  - 0 Medium Wound Dressing one or multiple wounds []  - 0 Large Wound Dressing one or multiple wounds X- 1 5 Application of Medications - topical []  - 0 Application of Medications - injection INTERVENTIONS - Miscellaneous []  - 0 External ear exam []  - 0 Specimen Collection (cultures, biopsies, blood, body fluids, etc.) []  - 0 Specimen(s) / Culture(s) sent or taken to Lab for analysis [name], [name] ([id number]) [id number] Page 3 of 8 []  - 0 Patient Transfer (multiple staff / Nurse, adult / Similar devices) []  - 0 Simple Staple / Suture removal (25 or less) []  - 0 Complex Staple / Suture removal (26 or more) []  - 0 Hypo / Hyperglycemic Management (close monitor of Blood Glucose) []  - 0 Ankle / Brachial Index (ABI) - do not check if billed separately X- 1 5 Vital Signs Has the patient been seen at the hospital within the last three years: Yes Total Score: 80 Level Of Care: New/Established - Level 3 Electronic Signature(s) Signed: 11/07/2022 3:28:11 PM By: Angelina Pih Entered By: Angelina Pih on 11/07/2022 11:05:58 -------------------------------------------------------------------------------- Encounter Discharge Information Details Patient Name: Date of Service: [name], Brooke Dibbles [name]. 11/07/2022 10:30 A M Medical Record Number: [id number] Patient Account Number: [id number] Date of Birth/Sex: Treating RN: 1935/12/17 (87 y.o. [name] Primary Care [name]: [name] Other Clinician: Referring Darivs Lunden: Treating [name]/Extender: [name] in [name]: 13 Encounter Discharge Information Items Discharge Condition: Stable Ambulatory Status: Ambulatory Discharge Destination: Home Transportation: Private Auto Accompanied By: self Schedule Follow-up Appointment: No Clinical Summary of Care: Electronic Signature(s) Signed: 11/07/2022 3:28:11 PM By: Angelina Pih Entered By: Angelina Pih on 11/07/2022 11:07:36 -------------------------------------------------------------------------------- Lower Extremity Assessment Details Patient Name: Date of Service: [name] [name]. 11/07/2022 10:30 A M Medical Record Number: [id number] Patient Account Number: [id number] Date of Birth/Sex: Treating RN: 1936-04-04 (87 y.o. [name] Primary Care Guillaume Weninger: [name] Other Clinician: YLANA, [name] ([id number]) [id number] Page 4 of 8 Referring Kaenan Jake: Treating Reta Norgren/Extender: [name] in [name]: 13 Edema Assessment Assessed: [Left: No] [Right: No] Edema: [Left: Ye] [Right: s] Calf Left: Right: Point of Measurement: 34 cm From Medial Instep 38.2 cm Ankle Left: Right: Point of Measurement: 11 cm From Medial Instep 24.6 cm Vascular Assessment Pulses: Dorsalis Pedis Palpable: [Right:Yes] Electronic Signature(s) Signed: 11/07/2022 3:28:11 PM By: Angelina Pih Entered By: Angelina Pih on 11/07/2022 10:40:02 -------------------------------------------------------------------------------- Multi Wound Chart Details Patient Name: Date of Service: [name], Brooke Dibbles [name]. 11/07/2022 10:30 A M Medical Record Number: [id number] Patient Account Number: [id number] Date of Birth/Sex: Treating RN: 02-19-1936 (87 y.o. [name] Primary Care Shaunice Levitan: [name] Other Clinician: Referring  [name]: Treating Sahira Cataldi/Extender: [name] in [name]: 13 Vital Signs Height(in): 69 Pulse(bpm): 75 Weight(lbs): 194 Blood Pressure(mmHg): 114/70 Body Mass Index(BMI): 28.6 Temperature(F): 97.6 Respiratory Rate(breaths/min): 18 [1:Photos:] [N/A:N/A] Right Lower Leg N/A N/A Wound Location: Trauma N/A N/A Wounding Event: Skin Tear N/A N/A Primary Etiology: Anemia, Hypertension, Osteoarthritis N/A N/A Comorbid History: 07/24/2022 N/A N/A Date Acquired: 27 N/A N/A Brooke Ade of TreatmentKELIAH, [name] ([id number]) [id number] Page 5 of 8 Open N/A N/A Wound Status: No N/A  N/A Wound Recurrence: 0.1x0.1x0.1 N/A N/A Measurements L x W x D (cm) 0.008 N/A N/A A (cm) : rea 0.001 N/A N/A Volume (cm) : 99.90% N/A N/A % Reduction in Area: 99.90% N/A N/A % Reduction in Volume: Full Thickness Without Exposed N/A N/A Classification: Support Structures None Present N/A N/A Exudate A mount: None Present (0%) N/A N/A Granulation A mount: None Present (0%) N/A N/A Necrotic A mount: Fat Layer (Subcutaneous Tissue): No N/A N/A Exposed Structures: Large (67-100%) N/A N/A Epithelialization: [name] Notes Electronic Signature(s) Signed: 11/07/2022 3:28:11 PM By: Angelina Pih Entered By: Angelina Pih on 11/07/2022 11:02:28 -------------------------------------------------------------------------------- Multi-Disciplinary Care Plan Details Patient Name: Date of Service: [name], Brooke Dibbles [name]. 11/07/2022 10:30 A M Medical Record Number: [id number] Patient Account Number: [id number] Date of Birth/Sex: Treating RN: Oct 01, 1935 (87 y.o. [name] Primary Care Timberly Yott: [name] Other Clinician: Referring Anila Bojarski: Treating Dallys Nowakowski/Extender: [name] in [name]: 13 Active Inactive Electronic Signature(s) Signed: 11/07/2022 3:28:11 PM By: Angelina Pih Entered By: Angelina Pih on 11/07/2022 11:06:11 -------------------------------------------------------------------------------- Pain Assessment Details Patient Name: Date of Service: [name] [name]. 11/07/2022 10:30 A M Medical Record Number: [id number] Patient Account Number: [id number] Date of Birth/Sex: Treating RN: 1935-12-22 (87 y.o. [name] Primary Care Antonie Borjon: [name] Other Clinician: Referring Deyona Soza: Treating Kilee Hedding/Extender: [name] in [name]: [address] [place] [name] ([id number]) [id number] Page 6 of 8 Active Problems Location of Pain Severity and Description of Pain Patient Has Paino No Site Locations Rate the pain. Current Pain Level: 0 Pain Management and Medication Current Pain Management: Electronic Signature(s) Signed: 11/07/2022 3:28:11 PM By: Angelina Pih Entered By: Angelina Pih on 11/07/2022 10:34:29 -------------------------------------------------------------------------------- Patient/Caregiver Education Details Patient Name: Date of Service: [name] [name]. 6/18/2024andnbsp10:30 A M Medical Record Number: [id number] Patient Account Number: [id number] Date of Birth/Gender: Treating RN: 10/10/1935 (87 y.o. [name] Primary Care Physician: [name] Other Clinician: Referring Physician: Treating Physician/Extender: [name] in [name]: 13 Education Assessment Education Provided To: Patient Education Topics Provided Wound/Skin Impairment: Handouts: Other: care of healed wound Methods: Explain/Verbal Responses: State content correctly Electronic Signature(s) Signed: 11/07/2022 3:28:11 PM By: Angelina Pih Entered By: Angelina Pih on 11/07/2022 11:06:28 [name] ([id number]) [id number] Page 7 of 8 -------------------------------------------------------------------------------- Wound  Assessment Details Patient Name: Date of Service: [name] [name]. 11/07/2022 10:30 A M Medical Record Number: [id number] Patient Account Number: [id number] Date of Birth/Sex: Treating RN: [birth year] (87 y.o. [name] Primary Care Kaprice Kage: [name] Other Clinician: Referring Diontay Rosencrans: Treating Lyndee Herbst/Extender: [name] in [name]: 13 Wound Status Wound Number: 1 Primary Etiology: Skin Tear Wound Location: Right Lower Leg Wound Status: Healed - Epithelialized Wounding Event: Trauma Comorbid History: Anemia, Hypertension, Osteoarthritis Date Acquired: 07/24/2022 Weeks Of [name]: 13 Clustered Wound: No Photos Wound Measurements Length: (cm) Width: (cm) Depth: (cm) Area: (cm) Volume: (cm) 0 % Reduction in Area: 100% 0 % Reduction in Volume: 100% 0 Epithelialization: Large (67-100%) 0 Tunneling: No 0 Undermining: No Wound Description Classification: Full Thickness Without Exposed Support Exudate Amount: None Present Structures Foul Odor After Cleansing: No Slough/Fibrino No Wound Bed Granulation Amount: None Present (0%) Exposed Structure Necrotic Amount: None Present (0%) Fat Layer (Subcutaneous Tissue) Exposed: No [name] Notes Wound #1 (Lower Leg) Wound Laterality: Right Cleanser Peri-Wound Care Topical Primary Dressing Secondary Dressing Secured With Compression [name], [name] ([id number]) [id number] Page 8 of 8 Compression Stockings Add-Ons Electronic Signature(s) Signed: 11/07/2022  3:28:11 PM By: Angelina Pih Entered By: Angelina Pih on 11/07/2022 11:03:42 -------------------------------------------------------------------------------- Vitals Details Patient Name: Date of Service: [name], Brooke Dibbles [name]. 11/07/2022 10:30 A M Medical Record Number: [id number] Patient Account Number: [id number] Date of Birth/Sex: Treating RN: 1935/11/28 (87 y.o. [name] Primary Care Haile Toppins: [name] Other Clinician: Referring Damarcus Reggio: Treating Hardin Hardenbrook/Extender: [name] in [name]: 13 Vital Signs Time Taken: 10:34 Temperature (F): 97.6 Height (in): 69 Pulse (bpm): 75 Weight (lbs): 194 Respiratory Rate (breaths/min): 18 Body Mass Index (BMI): 28.6 Blood Pressure (mmHg): 114/70 Reference Range: 80 - 120 mg / dl Electronic Signature(s) Signed: 11/07/2022 3:28:11 PM By: Angelina Pih Entered By: Angelina Pih on 11/07/2022 10:34:20

## 2022-11-07 NOTE — Progress Notes (Addendum)
MAKIA, BOSSI [name] ([id number]) [id number] Page 1 of 7 Visit Report for 11/07/2022 Chief Complaint Document Details Patient Name: Date of Service: [name]. 11/07/2022 10:30 A M Medical Record Number: [id number] Patient Account Number: [id number] Date of Birth/Sex: Treating RN: 13-Oct-1935 (86 y.o. [name] Primary Care Provider: Clydie Braun Other Clinician: Referring Provider: Treating Provider/Extender: Sydnee Cabal in Treatment: 13 Information Obtained from: Patient Chief Complaint Skin tear right leg 07/24/22 Electronic Signature(s) Signed: 11/07/2022 10:29:24 AM By: Allen Derry PA-C Entered By: Allen Derry on 11/07/2022 10:29:24 -------------------------------------------------------------------------------- HPI Details Patient Name: Date of Service: [name]. 11/07/2022 10:30 A M Medical Record Number: [id number] Patient Account Number: [id number] Date of Birth/Sex: Treating RN: 10/04/1935 (87 y.o. [name] Primary Care Provider: Clydie Braun Other Clinician: Referring Provider: Treating Provider/Extender: Sydnee Cabal in Treatment: 13 History of Present Illness HPI Description: 08-08-2022 upon evaluation today patient appears for initial inspection here in our clinic concerning issues that she has been having with a wound on her right anterior lower leg where she dropped a shredder on her leg causing a skin tear. Unfortunately this was attempted to be reapproximated but that has not been extremely effective for her. Fortunately there does not appear to be any signs of infection systemically which is great news. With that being said there is local infection definitely noted at this point. She has been on antibiotics. Currently cefdinir and doxycycline where the 2 antibiotics that were used up to this point. This original injury occurred on March 4. Her culture  showed Enterococcus faecium and that was on 08-06-2022 that was resulted. Her primary care provider Dr. Sampson Goon who is also an infectious disease specialist that stated that if she was not better at the end of her current cefdinir and doxycycline regimen that he would likely put her on Augmentin or amoxicillin. I think we may want to switch her to Augmentin at this point. Patient has a history of hypertension and peripheral vascular disease but really no other major medical problems at this point. With that being said although she did not show any signs of infection at this point I feel like this is something that might heal readily. However because of the screen showing PAD here in the clinic on the ABIs if she does not show signs of improvement over the next that I think we may want to go ahead and see about getting her to vascular. Further evaluation and treatment. Will however see how things do over the next week. 08-15-2022 upon evaluation today patient appears to be doing well currently in regard to her wound. She has been tolerating the dressing changes and overall seems to be doing quite well with regard to her wound I am seeing some definite improvement here. With that being said I do believe that she is making good progress with regard to the Iodoflex which I think is cleaning up the surface of the wound quite well I am pleased in that regard. She has started on the Augmentin currently and seems to be doing very well with that. [name], [name] ([id number]) [id number] Page 2 of 7 08-22-2022 upon evaluation today patient appears to be doing well currently in regard to her wound. She is actually tolerating the dressing changes without complication. Fortunately there does not appear to be any signs of active infection at this time which is great news and overall I do believe that she is significantly improved with  the Augmentin which is excellent as well. 08-29-2022  upon evaluation today patient appears to be doing well currently in regard to her wound which is actually making some pretty good progress here. I am extremely pleased with where we stand I think she is headed in the right direction and in general I do believe that we are making leaps and bounds towards getting this completely closed. With that being said I do think it is time to go ahead and switch over to collagen in place of the Iodoflex I think you will feel more comfortable and I think it will help with additional tissue growth much more effectively at this point. 09-05-2022 upon evaluation today patient appears to be doing well currently in regard to her wound which is actually showing signs of excellent improvement. Fortunately I do not see any evidence of active infection locally nor systemically at this time which is great news. 09-12-2022 upon evaluation today patient appears to be doing well currently in regard to her wound. She has been tolerating the dressing changes without complication and very pleased in that regard. Fortunately there does not appear to be any signs of active infection locally nor systemically which is great news. No fevers, chills, nausea, vomiting, or diarrhea. 09-19-2022 upon evaluation today patient appears to be doing well currently in regard to her wound is actually was measuring smaller, looking better, and overall seems to be an excellent track towards complete closure. Overall I do believe that she is making good progress here. 09-26-2022 upon evaluation today patient appears to be doing well currently in regard to her wound. She has been tolerating the dressing changes for the last week without any complication. Fortunately there does not appear to be any signs of active infection at this time. I do feel like she is making excellent progress here. 10-03-2022 upon evaluation today patient appears to be doing well with regard to her wound. She has been tolerating the  dressing changes without complication. Fortunately there does not appear to be any signs of active infection locally nor systemically at this time. No fevers, chills, nausea, vomiting, or diarrhea. 10-10-2022 upon evaluation patient's wound actually showing signs of significant improvement. With that being said unfortunately she continues to have some issues here with the wrap slipping down which is causing her a lot of distress she is not very comfortable with this at all. I am very pleased with the fact that the wound is however significantly smaller despite the fact her calf is bigger due to the wrap slipping down. 10-19-2022 upon evaluation today patient appears to be doing well currently in regard to her wound. She has been tolerating the dressing changes without complication. Fortunately there does not appear to be any signs of active infection locally nor systemically which is great news. No fevers, chills, nausea, vomiting, or diarrhea. 10-24-2022 upon evaluation today patient's wound is actually showing signs of excellent improvement. I am very pleased with where we stand I think that she has made great progress and in general I do think that we are moving in the right direction here. I do not see any evidence of infection at this point. 11-03-2022 upon evaluation today patient appears to be doing really well currently in regard to her wound which is actually showing signs of excellent improvement in fact this is significantly smaller even compared to last week I think she is very close probably within 2 weeks of complete resolution. 11-07-2022 upon evaluation today patient appears to be  doing well currently in regard to her wound. She has been tolerating the dressing changes without complication and in fact it would appear that she is healed on evaluation today. Electronic Signature(s) Signed: 11/09/2022 9:08:01 AM By: Allen Derry PA-C Entered By: Allen Derry on 11/09/2022  09:08:00 -------------------------------------------------------------------------------- Physical Exam Details Patient Name: Date of Service: [name]. 11/07/2022 10:30 A M Medical Record Number: [id number] Patient Account Number: [id number] Date of Birth/Sex: Treating RN: 06/10/1935 (87 y.o. [name] Primary Care Provider: Clydie Braun Other Clinician: Referring Provider: Treating Provider/Extender: Sydnee Cabal in Treatment: 13 Constitutional Well-nourished and well-hydrated in no acute distress. Respiratory normal breathing without difficulty. Psychiatric this patient is able to make decisions and demonstrates good insight into disease process. Alert and Oriented x 3. pleasant and cooperative. Notes Upon inspection patient's wound bed actually showed signs of good granulation epithelization at this point. Fortunately I do not see any evidence of active RITI, ROLLYSON ([id number]) [id number] Page 3 of 7 infection locally nor systemically which is great news and in general I think she is doing excellent with being completely healed. Electronic Signature(s) Signed: 11/09/2022 9:08:20 AM By: Allen Derry PA-C Entered By: Allen Derry on 11/09/2022 09:08:20 -------------------------------------------------------------------------------- Physician Orders Details Patient Name: Date of Service: [name]. 11/07/2022 10:30 A M Medical Record Number: [id number] Patient Account Number: [id number] Date of Birth/Sex: Treating RN: 1935/10/14 (87 y.o. [name] Primary Care Provider: Clydie Braun Other Clinician: Referring Provider: Treating Provider/Extender: Sydnee Cabal in Treatment: 13 Verbal / Phone Orders: No Diagnosis Coding ICD-10 Coding Code Description [phone] Laceration without foreign body, right lower leg, initial encounter L97.812 Non-pressure chronic  ulcer of other part of right lower leg with fat layer exposed I73.89 Other specified peripheral vascular diseases I10 Essential (primary) hypertension [facility]charge [facility] from [facility] Treatment Complete - AandD ointment 2x daily for a week and wear tubi grip, then apply AandD ointment nightly and wear your own compression socks during the day for at least a month. If you still have some swelling, suggested make a habit of wearing compression socks daily. Wear compression garments daily. Put garments on first thing when you wake up and remove them before bed. Moisturize legs daily after removing compression garments. Elevate, Exercise Daily and A void Standing for Long Periods of Time. DO YOUR BEST to sleep in the bed at night. DO NOT sleep in your recliner. Long hours of sitting in a recliner leads to swelling of the legs and/or potential wounds on your backside. Electronic Signature(s) Signed: 11/07/2022 3:28:11 PM By: Angelina Pih Signed: 11/09/2022 5:52:14 PM By: Allen Derry PA-C Entered By: Angelina Pih on 11/07/2022 11:07:21 -------------------------------------------------------------------------------- Problem List Details Patient Name: Date of Service: [name]. 11/07/2022 10:30 A M Medical Record Number: [id number] Patient Account Number: [id number] Date of Birth/Sex: Treating RN: 23-Oct-1935 (87 y.o. [name] Primary Care Provider: Clydie Braun Other Clinician: Referring Provider: Treating Provider/Extender: Sydnee Cabal in Treatment: [address] Emlyn [name] ([id number]) [id number] Page 4 of 7 Active Problems ICD-10 Encounter Code Description Active Date MDM Diagnosis S81.811A Laceration without foreign body, right lower leg, initial encounter 08/08/2022 No Yes L97.812 Non-pressure chronic ulcer of other part of right lower leg with fat layer 08/08/2022 No  Yes exposed I73.89 Other specified peripheral vascular diseases 08/08/2022 No Yes I10 Essential (primary) hypertension 08/08/2022 No Yes Inactive Problems Resolved Problems Electronic Signature(s) Signed: 11/07/2022 10:29:21  AM By: Allen Derry PA-C Entered By: Allen Derry on 11/07/2022 10:29:21 -------------------------------------------------------------------------------- Progress Note Details Patient Name: Date of Service: [name]. 11/07/2022 10:30 A M Medical Record Number: [id number] Patient Account Number: [id number] Date of Birth/Sex: Treating RN: 12/21/1935 (87 y.o. [name] Primary Care Provider: Clydie Braun Other Clinician: Referring Provider: Treating Provider/Extender: Sydnee Cabal in Treatment: 13 Subjective Chief Complaint Information obtained from Patient Skin tear right leg 07/24/22 History of Present Illness (HPI) 08-08-2022 upon evaluation today patient appears for initial inspection here in our clinic concerning issues that she has been having with a wound on her right anterior lower leg where she dropped a shredder on her leg causing a skin tear. Unfortunately this was attempted to be reapproximated but that has not been extremely effective for her. Fortunately there does not appear to be any signs of infection systemically which is great news. With that being said there is local infection definitely noted at this point. She has been on antibiotics. Currently cefdinir and doxycycline where the 2 antibiotics that were used up to this point. This original injury occurred on March 4. Her culture showed Enterococcus faecium and that was on 08-06-2022 that was resulted. Her primary care provider Dr. Sampson Goon who is also an infectious disease specialist that stated that if she was not better at the end of her current cefdinir and doxycycline regimen that he would likely put her on Augmentin or amoxicillin. I think we may want  to switch her to Augmentin at this point. Patient has a history of hypertension and peripheral vascular disease but really no other major medical problems at this point. With that being said although she did not show any signs of infection at this point I feel like this is something that might heal readily. However because of the screen showing PAD here in the clinic on the ABIs if she does not show signs of improvement over the next that I think we may want to go ahead and see about getting her to vascular. Further evaluation and treatment. Will however see how things do over the next week. 08-15-2022 upon evaluation today patient appears to be doing well currently in regard to her wound. She has been tolerating the dressing changes and overall [name], [name] ([id number]) [id number] Page 5 of 7 seems to be doing quite well with regard to her wound I am seeing some definite improvement here. With that being said I do believe that she is making good progress with regard to the Iodoflex which I think is cleaning up the surface of the wound quite well I am pleased in that regard. She has started on the Augmentin currently and seems to be doing very well with that. 08-22-2022 upon evaluation today patient appears to be doing well currently in regard to her wound. She is actually tolerating the dressing changes without complication. Fortunately there does not appear to be any signs of active infection at this time which is great news and overall I do believe that she is significantly improved with the Augmentin which is excellent as well. 08-29-2022 upon evaluation today patient appears to be doing well currently in regard to her wound which is actually making some pretty good progress here. I am extremely pleased with where we stand I think she is headed in the right direction and in general I do believe that we are making leaps and bounds towards getting this completely closed.  With that being said I  do think it is time to go ahead and switch over to collagen in place of the Iodoflex I think you will feel more comfortable and I think it will help with additional tissue growth much more effectively at this point. 09-05-2022 upon evaluation today patient appears to be doing well currently in regard to her wound which is actually showing signs of excellent improvement. Fortunately I do not see any evidence of active infection locally nor systemically at this time which is great news. 09-12-2022 upon evaluation today patient appears to be doing well currently in regard to her wound. She has been tolerating the dressing changes without complication and very pleased in that regard. Fortunately there does not appear to be any signs of active infection locally nor systemically which is great news. No fevers, chills, nausea, vomiting, or diarrhea. 09-19-2022 upon evaluation today patient appears to be doing well currently in regard to her wound is actually was measuring smaller, looking better, and overall seems to be an excellent track towards complete closure. Overall I do believe that she is making good progress here. 09-26-2022 upon evaluation today patient appears to be doing well currently in regard to her wound. She has been tolerating the dressing changes for the last week without any complication. Fortunately there does not appear to be any signs of active infection at this time. I do feel like she is making excellent progress here. 10-03-2022 upon evaluation today patient appears to be doing well with regard to her wound. She has been tolerating the dressing changes without complication. Fortunately there does not appear to be any signs of active infection locally nor systemically at this time. No fevers, chills, nausea, vomiting, or diarrhea. 10-10-2022 upon evaluation patient's wound actually showing signs of significant improvement. With that being said unfortunately she  continues to have some issues here with the wrap slipping down which is causing her a lot of distress she is not very comfortable with this at all. I am very pleased with the fact that the wound is however significantly smaller despite the fact her calf is bigger due to the wrap slipping down. 10-19-2022 upon evaluation today patient appears to be doing well currently in regard to her wound. She has been tolerating the dressing changes without complication. Fortunately there does not appear to be any signs of active infection locally nor systemically which is great news. No fevers, chills, nausea, vomiting, or diarrhea. 10-24-2022 upon evaluation today patient's wound is actually showing signs of excellent improvement. I am very pleased with where we stand I think that she has made great progress and in general I do think that we are moving in the right direction here. I do not see any evidence of infection at this point. 11-03-2022 upon evaluation today patient appears to be doing really well currently in regard to her wound which is actually showing signs of excellent improvement in fact this is significantly smaller even compared to last week I think she is very close probably within 2 weeks of complete resolution. 11-07-2022 upon evaluation today patient appears to be doing well currently in regard to her wound. She has been tolerating the dressing changes without complication and in fact it would appear that she is healed on evaluation today. Objective Constitutional Well-nourished and well-hydrated in no acute distress. Vitals Time Taken: 10:34 AM, Height: 69 in, Weight: 194 lbs, BMI: 28.6, Temperature: 97.6 F, Pulse: 75 bpm, Respiratory Rate: 18 breaths/min, Blood Pressure: 114/70 mmHg. Respiratory normal breathing without difficulty. Psychiatric this  patient is able to make decisions and demonstrates good insight into disease process. Alert and Oriented x 3. pleasant and cooperative. General  Notes: Upon inspection patient's wound bed actually showed signs of good granulation epithelization at this point. Fortunately I do not see any evidence of active infection locally nor systemically which is great news and in general I think she is doing excellent with being completely healed. Integumentary (Hair, Skin) Wound #1 status is Healed - Epithelialized. Original cause of wound was Trauma. The date acquired was: 07/24/2022. The wound has been in treatment 13 weeks. The wound is located on the Right Lower Leg. The wound measures 0cm length x 0cm width x 0cm depth; 0cm^2 area and 0cm^3 volume. There is no tunneling or undermining noted. There is a none present amount of drainage noted. There is no granulation within the wound bed. There is no necrotic tissue within the wound bed. Assessment Active Problems ICD-10 Laceration without foreign body, right lower leg, initial encounter Non-pressure chronic ulcer of other part of right lower leg with fat layer exposed [name], [name] ([id number]) [id number] Page 6 of 7 Other specified peripheral vascular diseases Essential (primary) hypertension Plan Discharge From [facility]: Discharge from [facility] Treatment Complete - AandD ointment 2x daily for a week and wear tubi grip, then apply AandD ointment nightly and wear your own compression socks during the day for at least a month. If you still have some swelling, suggested make a habit of wearing compression socks daily. Wear compression garments daily. Put garments on first thing when you wake up and remove them before bed. Moisturize legs daily after removing compression garments. Elevate, Exercise Daily and Avoid Standing for Long Periods of Time. DO YOUR BEST to sleep in the bed at night. DO NOT sleep in your recliner. Long hours of sitting in a recliner leads to swelling of the legs and/or potential wounds on your backside. 1. I would recommend that  we have the patient continue to monitor for any signs of infection or worsening overall. I do believe that we are moving in the right direction I am extremely pleased at this point. 2. I am good recommend that she should use some AandD ointment and compression garments during the day but in the evening she should be using the chest open to air and AandD ointment only. Again when she switches to her compression socks this should be without using AandD ointment out of the socks as we do not want to damage her compression socks. Will see her back for follow-up visit as needed. Electronic Signature(s) Signed: 11/09/2022 9:09:14 AM By: Allen Derry PA-C Entered By: Allen Derry on 11/09/2022 09:09:14 -------------------------------------------------------------------------------- SuperBill Details Patient Name: Date of Service: [name]. 11/07/2022 Medical Record Number: [id number] Patient Account Number: [id number] Date of Birth/Sex: Treating RN: [birth year] (87 y.o. [name] Primary Care Provider: Clydie Braun Other Clinician: Referring Provider: Treating Provider/Extender: Sydnee Cabal in Treatment: 13 Diagnosis Coding ICD-10 Codes Code Description [phone] Laceration without foreign body, right lower leg, initial encounter L97.812 Non-pressure chronic ulcer of other part of right lower leg with fat layer exposed I73.89 Other specified peripheral vascular diseases I10 Essential (primary) hypertension Facility Procedures : CPT4 Code: [id number] Description: 99213 - WOUND CARE VISIT-LEV 3 EST PT Modifier: Quantity: 1 Physician Procedures : CPT4 Code Description Modifier [id number] 99213 - WC PHYS LEVEL 3 - EST PT ICD-10 Diagnosis Description S81.811A Laceration without foreign body, right lower leg, initial encounter [name],  [name] ([id number]) [id number].  B76.283 Non-pressure chronic ulcer of other part of right lower  leg with fat layer exposed I73.89 Other specified peripheral vascular diseases I10 Essential (primary) hypertension Quantity: 1 pdf Page 7 of 7 Electronic Signature(s) Signed: 11/09/2022 9:09:27 AM By: Allen Derry PA-C Previous Signature: 11/07/2022 3:28:11 PM Version By: Angelina Pih Entered By: Allen Derry on 11/09/2022 09:09:27

## 2022-11-16 ENCOUNTER — Ambulatory Visit: Payer: HMO | Admitting: Occupational Therapy

## 2022-11-16 DIAGNOSIS — R600 Localized edema: Secondary | ICD-10-CM

## 2022-11-16 DIAGNOSIS — M6281 Muscle weakness (generalized): Secondary | ICD-10-CM

## 2022-11-16 DIAGNOSIS — R278 Other lack of coordination: Secondary | ICD-10-CM

## 2022-11-16 DIAGNOSIS — R208 Other disturbances of skin sensation: Secondary | ICD-10-CM

## 2022-11-16 NOTE — Therapy (Signed)
[facility] [address] [place], [address] Phone: [phone]   Fax:  [phone]  Occupational Therapy Treatment  Patient Details  Name: [name] MRN: [id number] Date of Birth: [birth year] No data recorded  Encounter Date: 11/16/2022   OT End of Session - 11/16/22 1118     Visit Number 10    Number of Visits 13    Date for OT Re-Evaluation 12/28/22    OT Start Time 1118    OT Stop Time 1200    OT Time Calculation (min) 42 min    Activity Tolerance Patient tolerated treatment well    Behavior During Therapy [facility] for tasks assessed/performed             Past Medical History:  Diagnosis Date   Arthritis    GERD (gastroesophageal reflux disease)    Hx of basal cell carcinoma 01/13/2013   left parasternal chest   Hx of basal cell carcinoma 04/26/2015   right sacral area   Hx of basal cell carcinoma 04/26/2016   left temple   Hypertension     Past Surgical History:  Procedure Laterality Date   ABDOMINAL HYSTERECTOMY     APPENDECTOMY     BACK SURGERY     COLONOSCOPY WITH PROPOFOL N/A 03/07/2017   Procedure: COLONOSCOPY WITH PROPOFOL;  Surgeon: Toledo, Boykin Nearing, MD;  Location: [facility] ENDOSCOPY;  Service: Gastroenterology;  Laterality: N/A;   ESOPHAGOGASTRODUODENOSCOPY N/A 03/07/2017   Procedure: ESOPHAGOGASTRODUODENOSCOPY (EGD);  Surgeon: Toledo, Boykin Nearing, MD;  Location: [facility] ENDOSCOPY;  Service: Gastroenterology;  Laterality: N/A;   FOOT SURGERY Left 05/2016   JOINT REPLACEMENT Bilateral 2010 2011   REPLACEMENT TOTAL KNEE BILATERAL  2010    There were no vitals filed for this visit.   Subjective Assessment - 11/16/22 1117     Subjective  I am able to do most everything with my hand.  I can type on computer  better not pecking anymore.  And was able to get my earring on.    Pertinent History Per chart, recent MD note:  [name] returns today for re-evaluation of her left upper extremity.  She is a 87 y.o. female who I last saw in 07/04/2022. Patient is status post left rTSA with a block with Dr. Olga Millers on 09/23/2021. She states that in PACU she noticed numbness in the radial nerve distribution and was unable to extend her wrist. Since that time she has regained wrist extension. She has not noted improvement in the numbness in the radial nerve distribution. She has had multiple EMG studies which have shown electrodiagnostic evidence of recovery of the radial nerve proper. She returns today for reevaluation. She notes that she has had some new onset swelling at the dorsal aspect of her left hand. She is not sure why this happened. She has not seen any improvement in terms of her finger or thumb function. Examination of the left upper extremity shows she has normal sensation in ulnar and median nerve distributions. She has diminished sensation and pressure perception to light touch in left radial nerve distribution. Skin is intact. Fingers are warm and well perfused with brisk capillary refill and she has a palpable radial pulse. There is no tenderness to palpation throughout. 5/5 triceps. 5/5 brachioradialis. 5/5 left wrist extension through her radial wrist extenders, 0/5 EDC, 0/5 EPL. 0/5 EIP    Patient Stated Goals Pt would like to improve movement and strength of her left hand to complete daily  tasks with greater ease and independence.    Currently in Pain? No/denies                [facility] OT Assessment - 11/16/22 0001       Strength   Right Hand Grip (lbs) 40    Right Hand Lateral Pinch 9 lbs    Right Hand 3 Point Pinch 6 lbs    Left Hand Grip (lbs) 32    Left Hand Lateral Pinch 9 lbs    Left Hand 3 Point Pinch 5 lbs      Left Hand AROM   L Thumb Radial ADduction/ABduction 0-55 43    L Thumb Palmar ADduction/ABduction 0-45 52             Coming in - place and hold able to maintain [facility] extention of 3rd thru 5th at 0 degrees and off table - but unable to tolerate manual  resistance   Patient unable to do resistance of rubber band for [facility] extension Or manual resistance from OT. But able to resist better  Patient to show increased individual digit extension  while opposing to digits.   Review again 3/4 lbs cuff weight -sliding hand off table - maintain partial extention -mod v/c to not pick up hand Patient doing MC PIP extension simulating or flipping into 4 ounce putty 2 sets of 12 reps Patient doing opposition with wrist flexion while maintaining at 3 digits and MCP and PIP extension Patient needed mod to max verbal cueing and tactile cueing. Using tenolysis Light blue putty on table patient pushing with digits into extension into putty 2 sets of 15. Patient is monitorable cueing to not compensate with forearm. Still need mod v/c to not compensate    Patient to do these exercises with wall slides in between for composite flexor stretch   Work on digits extention rolling over putty- keeping 5th into ADD  Cont - 1-4 dots -for pt to reach to =simulate typing - or digits extention to 4 dots  Alternate digits -   This date pt report able to do typing better and putting on earring  And pick up 3 small objects from fingers <> palm <> fingers- 3 objects one at time release  from storing in palm  Pick up with ease but release difficulty with 4th and 5th digits retrieving  And pick up alternate digits - opposition -and keeping other digits in extention And 2 cm by 2 cm object from fingers <> palm <> fingers  -up 2nd/3rd and 4th/5th 12 reps  Wearing CMC neoprene splint - pt report numbness in thumb still but improving -numbness better than last time  - preventing CMC ADD in palm - causing CTS Pt to use during day with activities and during [facility]                      OT Education - 11/16/22 1118     Education Details HEP and splint wearing    Person(s) Educated Patient    Methods Explanation;Demonstration;Tactile cues;Verbal cues    Comprehension  Verbalized understanding              OT Short Term Goals - 11/02/22 1409       OT SHORT TERM GOAL #1   Title Pt will be independent with HEP for left wrist and hand    Status Achieved               OT Long Term Goals -  11/02/22 1410       OT LONG TERM GOAL #1   Title Pt will demonstrate improve MP extension by 10 degrees to facilitate greater active reach for objects    Baseline MC extention increase for pt to donn glove , put hand in pocket and release objects    Status Achieved      OT LONG TERM GOAL #2   Title Pt will demonstrate improvement of grip by 10# to grasp items without dropping.    Baseline Eval:  left grip limited to 24#, difficulty maintaining grip on objects - NOW increase to 33 lbs -    Time 8    Period Weeks    Status On-going    Target Date 12/28/22      OT LONG TERM GOAL #3   Title Pt will demonstrate improved functional  strength in wrist and hand to operate manual can opener without difficulty.    Status Achieved      OT LONG TERM GOAL #4   Title Pt will demonstrate improved hand function and coordination to engage in meal preparation tasks with modified independence.    Baseline Eval:  difficulty with kitchen tasks such as cracking an egg, holding pots  NOW Can do , open package, cut with scissors, open jars    Status Achieved      OT LONG TERM GOAL #5   Title Pt able to type on computer 2-5 min with more ease    Baseline Difficulty with typing on computer - pecking still with one finger- coordination decrease ind extention out of PIP and MC -    Time 8    Period Weeks    Status New    Target Date 12/28/22                   Plan - 11/16/22 1118     Clinical Impression Statement Pt is a 87 yo female who had a fall in Sept 2022 with a traumatic tear repair to her shoulder, she then underwent surgery on Sep 23, 2021 for a reverse shoulder 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  She reports afterward she had numbness along the left radial nerve  distribution and decreased hand and wrist motion.  Her last EMG study indicated healing over time.  . She presents  at eval with limited active motion in her left hand especially for finger extension at [facility] and thumb PA and RA, decreased strength overall but also decreased grip and pinch strengths with comparison. Pt showed increase extention of digits and thumb PA and RA. Increase grip and prehension strength and functional use with able to do earrings now and also type on computer - not pecking anymore.  Can carry and lift 8 lbs, push and pull heavy door open-  putting her hand in her pocket as well as donning a glove and reach with PA inbetween objects to grasp. Also able to main individual digit extention  when doing opposition to digits. Having trouble maintaining MC extension with wrist extension. and tapping of digits. Can place and hold 3rd thru 5th off table. [facility] manipulation of small objects palm<> finger tips mostly difficulty retrieving out of palm. Pt  wants to see improvements in her left hand function to perform daily tasks with greater ease and independence - one being typing on computer. Focusing on strengthening [facility] extention.  Pt would benefit from skilled OT services to maximize her safety and independence with ADL and IADL tasks at home and in  the community.    OT Occupational Profile and History Detailed Assessment- Review of Records and additional review of physical, cognitive, psychosocial history related to current functional performance    Occupational performance deficits (Please refer to evaluation for details): ADL's;IADL's;Leisure    Body Structure / Function / Physical Skills ADL;ROM;UE functional use;FMC;Dexterity;Sensation;Edema;Strength;Coordination;IADL    Psychosocial Skills Habits;Routines and Behaviors;Environmental  Adaptations    Rehab Potential Good    Clinical Decision Making Several treatment options, min-mod task modification necessary    Comorbidities Affecting  Occupational Performance: May have comorbidities impacting occupational performance    Modification or Assistance to Complete Evaluation  No modification of tasks or assist necessary to complete eval    OT Frequency Biweekly    OT Duration 8 weeks    OT Treatment/Interventions Self-care/ADL training;Electrical Stimulation;Therapeutic exercise;Coping strategies training;Moist Heat;Paraffin;Neuromuscular education;Splinting;Patient/family education;Fluidtherapy;Therapeutic activities;Cryotherapy;Ultrasound;Contrast Bath;DME and/or AE instruction;Manual Therapy;Passive range of motion    Consulted and Agree with Plan of Care Patient             Patient will benefit from skilled therapeutic intervention in order to improve the following deficits and impairments:   Body Structure / Function / Physical Skills: ADL, ROM, UE functional use, FMC, Dexterity, Sensation, Edema, Strength, Coordination, IADL   Psychosocial Skills: Habits, Routines and Behaviors, Environmental  Adaptations   Visit Diagnosis: Other lack of coordination  Localized edema  Other disturbances of skin sensation  Muscle weakness (generalized)    Problem List Patient Active Problem List   Diagnosis Date Noted   CVA (cerebral infarction) 11/28/2015    Oletta Cohn, OTR/L,CLT 11/16/2022, 7:35 PM  [facility] [address], [place], [address] Phone: [phone]   Fax:  [phone]  Name: [name] MRN: [id number] Date of Birth: 1936/04/20

## 2022-11-28 DIAGNOSIS — Z961 Presence of intraocular lens: Secondary | ICD-10-CM | POA: Diagnosis not present

## 2022-11-28 DIAGNOSIS — D3132 Benign neoplasm of left choroid: Secondary | ICD-10-CM | POA: Diagnosis not present

## 2022-12-01 ENCOUNTER — Ambulatory Visit: Payer: HMO | Attending: Orthopedic Surgery | Admitting: Occupational Therapy

## 2022-12-01 DIAGNOSIS — R278 Other lack of coordination: Secondary | ICD-10-CM

## 2022-12-01 DIAGNOSIS — R208 Other disturbances of skin sensation: Secondary | ICD-10-CM | POA: Diagnosis not present

## 2022-12-01 DIAGNOSIS — M6281 Muscle weakness (generalized): Secondary | ICD-10-CM | POA: Diagnosis not present

## 2022-12-01 NOTE — Therapy (Signed)
[facility] [address] [place], [address] Phone: [phone]   Fax:  [phone]  Occupational Therapy Treatment  Patient Details  Name: [name] MRN: [id number] Date of Birth: 1936-03-18 No data recorded  Encounter Date: 12/01/2022   OT End of Session - 12/01/22 1500     Visit Number 11    Number of Visits 13    OT Start Time 1032    OT Stop Time 1110    OT Time Calculation (min) 38 min    Activity Tolerance Patient tolerated treatment well    Behavior During Therapy [facility] for tasks assessed/performed             Past Medical History:  Diagnosis Date   Arthritis    GERD (gastroesophageal reflux disease)    Hx of basal cell carcinoma 01/13/2013   left parasternal chest   Hx of basal cell carcinoma 04/26/2015   right sacral area   Hx of basal cell carcinoma 04/26/2016   left temple   Hypertension     Past Surgical History:  Procedure Laterality Date   ABDOMINAL HYSTERECTOMY     APPENDECTOMY     BACK SURGERY     COLONOSCOPY WITH PROPOFOL N/A 03/07/2017   Procedure: COLONOSCOPY WITH PROPOFOL;  Surgeon: Toledo, Boykin Nearing, MD;  Location: [facility] ENDOSCOPY;  Service: Gastroenterology;  Laterality: N/A;   ESOPHAGOGASTRODUODENOSCOPY N/A 03/07/2017   Procedure: ESOPHAGOGASTRODUODENOSCOPY (EGD);  Surgeon: Toledo, Boykin Nearing, MD;  Location: [facility] ENDOSCOPY;  Service: Gastroenterology;  Laterality: N/A;   FOOT SURGERY Left 05/2016   JOINT REPLACEMENT Bilateral 2010 2011   REPLACEMENT TOTAL KNEE BILATERAL  2010    There were no vitals filed for this visit.   Subjective Assessment - 12/01/22 1459     Subjective  My hand is so much better. Typing on computer better, can put on my earring- open small packages and zip locks- and close them    Pertinent History Per chart, recent MD note:  [name] returns today for re-evaluation of her left upper extremity. She is a 87 y.o. female who I last saw in  07/04/2022. Patient is status post left rTSA with a block with Dr. Olga Millers on 09/23/2021. She states that in PACU she noticed numbness in the radial nerve distribution and was unable to extend her wrist. Since that time she has regained wrist extension. She has not noted improvement in the numbness in the radial nerve distribution. She has had multiple EMG studies which have shown electrodiagnostic evidence of recovery of the radial nerve proper. She returns today for reevaluation. She notes that she has had some new onset swelling at the dorsal aspect of her left hand. She is not sure why this happened. She has not seen any improvement in terms of her finger or thumb function. Examination of the left upper extremity shows she has normal sensation in ulnar and median nerve distributions. She has diminished sensation and pressure perception to light touch in left radial nerve distribution. Skin is intact. Fingers are warm and well perfused with brisk capillary refill and she has a palpable radial pulse. There is no tenderness to palpation throughout. 5/5 triceps. 5/5 brachioradialis. 5/5 left wrist extension through her radial wrist extenders, 0/5 EDC, 0/5 EPL. 0/5 EIP    Patient Stated Goals Pt would like to improve movement and strength of her left hand to complete daily tasks with greater ease and independence.    Currently in Pain? No/denies  [facility] OT Assessment - 12/01/22 0001       Left Hand AROM   L Thumb Radial ADduction/ABduction 0-55 50    L Thumb Palmar ADduction/ABduction 0-45 52                 Coming in - place and hold able to maintain [facility] extention of 3rd thru 5th at 0 degrees and off table - but unable to tolerate manual resistance   Patient to show increased individual digit extension  while opposing to digits.     Patient to cont  with wall slides for composite flexor stretch prior to hand exercises   Work on digits extention rolling over putty- keeping  5th into ADD  Cont - 1-4 dots -for pt to reach to =simulate typing - or digits extention to 4 dots  Alternate digits -    Cont to report typing better    And pick up 3 small objects from fingers <> palm <> fingers- 3 objects one at time release  from storing in palm  Pick up with ease but release difficulty with 4th and 5th digits retrieving   And pick up alternate digits - opposition -and keeping other digits in extention And 2 cm by 2 cm object from fingers <> palm <> fingers  -up 2nd/3rd and 4th/5th 12 reps  Cont CMC neoprene splint - pt report numbness in thumb still but improving -numbness better than last time  - preventing CMC ADD in palm - causing CTS Show this date increase thumb RA  And able to add rubber band for RA from wrist cuff - place and hold   Also add rubber band to wrist cuff -and able to do digits place and hold 12 reps  With gravity  Add to HEP                    OT Education - 12/01/22 1500     Education Details HEP and splint wearing    Person(s) Educated Patient    Methods Explanation;Demonstration;Tactile cues;Verbal cues    Comprehension Verbalized understanding              OT Short Term Goals - 11/02/22 1409       OT SHORT TERM GOAL #1   Title Pt will be independent with HEP for left wrist and hand    Status Achieved               OT Long Term Goals - 11/02/22 1410       OT LONG TERM GOAL #1   Title Pt will demonstrate improve MP extension by 10 degrees to facilitate greater active reach for objects    Baseline MC extention increase for pt to donn glove , put hand in pocket and release objects    Status Achieved      OT LONG TERM GOAL #2   Title Pt will demonstrate improvement of grip by 10# to grasp items without dropping.    Baseline Eval:  left grip limited to 24#, difficulty maintaining grip on objects - NOW increase to 33 lbs -    Time 8    Period Weeks    Status On-going    Target Date 12/28/22      OT LONG  TERM GOAL #3   Title Pt will demonstrate improved functional  strength in wrist and hand to operate manual can opener without difficulty.    Status Achieved      OT LONG TERM  GOAL #4   Title Pt will demonstrate improved hand function and coordination to engage in meal preparation tasks with modified independence.    Baseline Eval:  difficulty with kitchen tasks such as cracking an egg, holding pots  NOW Can do , open package, cut with scissors, open jars    Status Achieved      OT LONG TERM GOAL #5   Title Pt able to type on computer 2-5 min with more ease    Baseline Difficulty with typing on computer - pecking still with one finger- coordination decrease ind extention out of PIP and MC -    Time 8    Period Weeks    Status New    Target Date 12/28/22                   Plan - 12/01/22 1500     Clinical Impression Statement Pt is a 87 yo female who had a fall in Sept 2022 with a traumatic tear repair to her shoulder, she then underwent surgery on Sep 23, 2021 for a reverse shoulder 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  She reports afterward she had numbness along the left radial nerve distribution and decreased hand and wrist motion.  Her last EMG study indicated healing over time. Pt cont to make progress in digits extention , FMC and grip and prehension strenght in L hand. Whith use of CMC neoprene wrap to keep thumb out of CT - show increase RA with increase strenght this week - able to add rubber band for RA for HEP. Cont to show increase individual digit extention  when doing opposition to digits. Cont to have trouble maintaining MC extension with wrist extension. and tapping of digits. Able to add to HEP rubber and from wrist band for digits extention place and hold with gravity.  [facility] manipulation of small objects palm<> finger tips mostly difficulty retrieving out of palm. Pt  wants to see improvements in her left hand function to perform daily tasks with greater ease and independence - one  being typing on computer. Focusing on strengthening [facility] extention.  Pt would benefit from skilled OT services to maximize her safety and independence with ADL and IADL tasks at home and in the community.    OT Occupational Profile and History Detailed Assessment- Review of Records and additional review of physical, cognitive, psychosocial history related to current functional performance    Occupational performance deficits (Please refer to evaluation for details): ADL's;IADL's;Leisure    Body Structure / Function / Physical Skills ADL;ROM;UE functional use;FMC;Dexterity;Sensation;Edema;Strength;Coordination;IADL    Psychosocial Skills Habits;Routines and Behaviors;Environmental  Adaptations    Rehab Potential Good    Clinical Decision Making Several treatment options, min-mod task modification necessary    Comorbidities Affecting Occupational Performance: May have comorbidities impacting occupational performance    Modification or Assistance to Complete Evaluation  No modification of tasks or assist necessary to complete eval    OT Frequency Biweekly    OT Duration 6 weeks    OT Treatment/Interventions Self-care/ADL training;Electrical Stimulation;Therapeutic exercise;Coping strategies training;Moist Heat;Paraffin;Neuromuscular education;Splinting;Patient/family education;Fluidtherapy;Therapeutic activities;Cryotherapy;Ultrasound;Contrast Bath;DME and/or AE instruction;Manual Therapy;Passive range of motion    Consulted and Agree with Plan of Care Patient             Patient will benefit from skilled therapeutic intervention in order to improve the following deficits and impairments:   Body Structure / Function / Physical Skills: ADL, ROM, UE functional use, FMC, Dexterity, Sensation, Edema, Strength, Coordination, IADL   Psychosocial Skills:  Habits, Routines and Behaviors, Environmental  Adaptations   Visit Diagnosis: Other lack of coordination  Other disturbances of skin  sensation  Muscle weakness (generalized)    Problem List Patient Active Problem List   Diagnosis Date Noted   CVA (cerebral infarction) 11/28/2015    Oletta Cohn, OTR/L,CLT 12/01/2022, 3:09 PM  [facility] [address], [place], [address] Phone: [phone]   Fax:  [phone]  Name: JAYDALIS RESTA MRN: [id number] Date of Birth: 04/15/1936

## 2022-12-13 DIAGNOSIS — M5033 Other cervical disc degeneration, cervicothoracic region: Secondary | ICD-10-CM | POA: Diagnosis not present

## 2022-12-13 DIAGNOSIS — M9905 Segmental and somatic dysfunction of pelvic region: Secondary | ICD-10-CM | POA: Diagnosis not present

## 2022-12-13 DIAGNOSIS — M9901 Segmental and somatic dysfunction of cervical region: Secondary | ICD-10-CM | POA: Diagnosis not present

## 2022-12-13 DIAGNOSIS — M955 Acquired deformity of pelvis: Secondary | ICD-10-CM | POA: Diagnosis not present

## 2022-12-18 ENCOUNTER — Ambulatory Visit: Payer: HMO | Admitting: Occupational Therapy

## 2022-12-20 ENCOUNTER — Ambulatory Visit: Payer: HMO | Admitting: Occupational Therapy

## 2022-12-20 DIAGNOSIS — R278 Other lack of coordination: Secondary | ICD-10-CM | POA: Diagnosis not present

## 2022-12-20 DIAGNOSIS — M6281 Muscle weakness (generalized): Secondary | ICD-10-CM

## 2022-12-20 DIAGNOSIS — R208 Other disturbances of skin sensation: Secondary | ICD-10-CM

## 2022-12-20 NOTE — Therapy (Signed)
[facility] [address] [place], [address] Phone: [phone]   Fax:  [phone]  Occupational Therapy Treatment  Patient Details  Name: [name] MRN: [id number] Date of Birth: 1936/01/28 No data recorded  Encounter Date: 12/20/2022   OT End of Session - 12/20/22 1400     Visit Number 12    Number of Visits 13    Date for OT Re-Evaluation 12/28/22    OT Start Time 1400    OT Stop Time 1444    OT Time Calculation (min) 44 min    Activity Tolerance Patient tolerated treatment well    Behavior During Therapy [facility] for tasks assessed/performed             Past Medical History:  Diagnosis Date   Arthritis    GERD (gastroesophageal reflux disease)    Hx of basal cell carcinoma 01/13/2013   left parasternal chest   Hx of basal cell carcinoma 04/26/2015   right sacral area   Hx of basal cell carcinoma 04/26/2016   left temple   Hypertension     Past Surgical History:  Procedure Laterality Date   ABDOMINAL HYSTERECTOMY     APPENDECTOMY     BACK SURGERY     COLONOSCOPY WITH PROPOFOL N/A 03/07/2017   Procedure: COLONOSCOPY WITH PROPOFOL;  Surgeon: Toledo, Boykin Nearing, MD;  Location: [facility] ENDOSCOPY;  Service: Gastroenterology;  Laterality: N/A;   ESOPHAGOGASTRODUODENOSCOPY N/A 03/07/2017   Procedure: ESOPHAGOGASTRODUODENOSCOPY (EGD);  Surgeon: Toledo, Boykin Nearing, MD;  Location: [facility] ENDOSCOPY;  Service: Gastroenterology;  Laterality: N/A;   FOOT SURGERY Left 05/2016   JOINT REPLACEMENT Bilateral 2010 2011   REPLACEMENT TOTAL KNEE BILATERAL  2010    There were no vitals filed for this visit.   Subjective Assessment - 12/20/22 1400     Subjective  I doing most every thing - but you need to show me that exercises with the rubber bands for my fingers - I cant wait to go show my surgeons how good my hand is doing    Pertinent History Per chart, recent MD note:  Ms. Scanlon returns today for  re-evaluation of her left upper extremity. She is a 87 y.o. female who I last saw in 07/04/2022. Patient is status post left rTSA with a block with Dr. Olga Millers on 09/23/2021. She states that in PACU she noticed numbness in the radial nerve distribution and was unable to extend her wrist. Since that time she has regained wrist extension. She has not noted improvement in the numbness in the radial nerve distribution. She has had multiple EMG studies which have shown electrodiagnostic evidence of recovery of the radial nerve proper. She returns today for reevaluation. She notes that she has had some new onset swelling at the dorsal aspect of her left hand. She is not sure why this happened. She has not seen any improvement in terms of her finger or thumb function. Examination of the left upper extremity shows she has normal sensation in ulnar and median nerve distributions. She has diminished sensation and pressure perception to light touch in left radial nerve distribution. Skin is intact. Fingers are warm and well perfused with brisk capillary refill and she has a palpable radial pulse. There is no tenderness to palpation throughout. 5/5 triceps. 5/5 brachioradialis. 5/5 left wrist extension through her radial wrist extenders, 0/5 EDC, 0/5 EPL. 0/5 EIP    Patient Stated Goals Pt would like to improve movement and strength of  her left hand to complete daily tasks with greater ease and independence.    Currently in Pain? No/denies                [facility] OT Assessment - 12/20/22 0001       Strength   Right Hand Grip (lbs) 40    Right Hand Lateral Pinch 9 lbs    Right Hand 3 Point Pinch 6 lbs    Left Hand Grip (lbs) 35    Left Hand Lateral Pinch 9 lbs    Left Hand 3 Point Pinch 5 lbs      Left Hand AROM   L Thumb Radial ADduction/ABduction 0-55 50    L Thumb Palmar ADduction/ABduction 0-45 52              Coming in - place and hold able to maintain [facility] extention of 3rd thru 5th at 0 degrees and  off table - but unable to tolerate manual resistance and decrease MC extention with wrist extention -but better than last time     Patient to cont  with wall slides for composite flexor stretch prior to hand exercises    Cont - 1-4 dots -for pt to reach to =simulate typing - or digits extention to 4 dots  Alternate digits -    Cont to report typing better    And pick up alternate digits - opposition -and keeping other digits in extention And making different signs with digits extention Cont CMC neoprene splint - pt report numbness in thumb still but improving -numbness better than last time  - preventing CMC ADD in palm - causing CTS Cont AAROM and followed by rubberband for PA and RA of thumb   Pt ask to review again rubber band to wrist cuff -and able to do digits place and hold 12 reps  With gravity and against gravity this date  Reinforce to keep wrist straight  Cont HEP for 4 wks                         OT Education - 12/20/22 1400     Education Details HEP and splint wearing    Person(s) Educated Patient    Methods Explanation;Demonstration;Tactile cues;Verbal cues    Comprehension Verbalized understanding              OT Short Term Goals - 11/02/22 1409       OT SHORT TERM GOAL #1   Title Pt will be independent with HEP for left wrist and hand    Status Achieved               OT Long Term Goals - 11/02/22 1410       OT LONG TERM GOAL #1   Title Pt will demonstrate improve MP extension by 10 degrees to facilitate greater active reach for objects    Baseline MC extention increase for pt to donn glove , put hand in pocket and release objects    Status Achieved      OT LONG TERM GOAL #2   Title Pt will demonstrate improvement of grip by 10# to grasp items without dropping.    Baseline Eval:  left grip limited to 24#, difficulty maintaining grip on objects - NOW increase to 33 lbs -    Time 8    Period Weeks    Status On-going    Target  Date 12/28/22      OT LONG TERM GOAL #  3   Title Pt will demonstrate improved functional  strength in wrist and hand to operate manual can opener without difficulty.    Status Achieved      OT LONG TERM GOAL #4   Title Pt will demonstrate improved hand function and coordination to engage in meal preparation tasks with modified independence.    Baseline Eval:  difficulty with kitchen tasks such as cracking an egg, holding pots  NOW Can do , open package, cut with scissors, open jars    Status Achieved      OT LONG TERM GOAL #5   Title Pt able to type on computer 2-5 min with more ease    Baseline Difficulty with typing on computer - pecking still with one finger- coordination decrease ind extention out of PIP and MC -    Time 8    Period Weeks    Status New    Target Date 12/28/22                   Plan - 12/20/22 1400     Clinical Impression Statement Pt is a 87 yo female who had a fall in Sept 2022 with a traumatic tear repair to her shoulder, she then underwent surgery on Sep 23, 2021 for a reverse shoulder 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  She reports afterward she had numbness along the left radial nerve distribution and decreased hand and wrist motion.  Her last EMG study indicated healing over time. Pt cont to make progress in digits extention , FMC and grip and prehension strenght in L hand. Grip increase to 35 lbs L , R 40 lbs - prehension about same as R hand. Cont to wear CMC neoprene wrap to keep thumb out of CT - pt to cont to work on [employer] prior to rubber band for PA and RA of thumb.  Cont to show increase individual digit extention  when doing opposition to digits. Cont to have trouble maintaining MC extension with wrist extension. and tapping of digits. Review with pt rubber from wrist band for digits extention place and hold with gravity and against gravity this date.  Focusing on strengthening [facility] extention.  Pt would benefit from skilled OT services to maximize her safety and  independence with ADL and IADL tasks at home and in the community. Follow up in month - cont with HEP.    OT Occupational Profile and History Detailed Assessment- Review of Records and additional review of physical, cognitive, psychosocial history related to current functional performance    Occupational performance deficits (Please refer to evaluation for details): ADL's;IADL's;Leisure    Body Structure / Function / Physical Skills ADL;ROM;UE functional use;FMC;Dexterity;Sensation;Edema;Strength;Coordination;IADL    Psychosocial Skills Habits;Routines and Behaviors;Environmental  Adaptations    Rehab Potential Good    Clinical Decision Making Several treatment options, min-mod task modification necessary    Comorbidities Affecting Occupational Performance: May have comorbidities impacting occupational performance    Modification or Assistance to Complete Evaluation  No modification of tasks or assist necessary to complete eval    OT Frequency Biweekly    OT Duration 6 weeks    OT Treatment/Interventions Self-care/ADL training;Electrical Stimulation;Therapeutic exercise;Coping strategies training;Moist Heat;Paraffin;Neuromuscular education;Splinting;Patient/family education;Fluidtherapy;Therapeutic activities;Cryotherapy;Ultrasound;Contrast Bath;DME and/or AE instruction;Manual Therapy;Passive range of motion    Consulted and Agree with Plan of Care Patient             Patient will benefit from skilled therapeutic intervention in order to improve the following deficits and impairments:   Body  Structure / Function / Physical Skills: ADL, ROM, UE functional use, FMC, Dexterity, Sensation, Edema, Strength, Coordination, IADL   Psychosocial Skills: Habits, Routines and Behaviors, Environmental  Adaptations   Visit Diagnosis: Other lack of coordination  Other disturbances of skin sensation  Muscle weakness (generalized)    Problem List Patient Active Problem List   Diagnosis Date  Noted   CVA (cerebral infarction) 11/28/2015    Oletta Cohn, OTR/L,CLT 12/20/2022, 4:51 PM  [facility] [address], [place], [address] Phone: [phone]   Fax:  [phone]  Name: [name] MRN: [id number] Date of Birth: May 28, 1935

## 2022-12-27 DIAGNOSIS — M9901 Segmental and somatic dysfunction of cervical region: Secondary | ICD-10-CM | POA: Diagnosis not present

## 2022-12-27 DIAGNOSIS — M5033 Other cervical disc degeneration, cervicothoracic region: Secondary | ICD-10-CM | POA: Diagnosis not present

## 2022-12-27 DIAGNOSIS — M9905 Segmental and somatic dysfunction of pelvic region: Secondary | ICD-10-CM | POA: Diagnosis not present

## 2022-12-27 DIAGNOSIS — M955 Acquired deformity of pelvis: Secondary | ICD-10-CM | POA: Diagnosis not present

## 2023-01-10 DIAGNOSIS — M5033 Other cervical disc degeneration, cervicothoracic region: Secondary | ICD-10-CM | POA: Diagnosis not present

## 2023-01-10 DIAGNOSIS — M955 Acquired deformity of pelvis: Secondary | ICD-10-CM | POA: Diagnosis not present

## 2023-01-10 DIAGNOSIS — M9905 Segmental and somatic dysfunction of pelvic region: Secondary | ICD-10-CM | POA: Diagnosis not present

## 2023-01-10 DIAGNOSIS — M9901 Segmental and somatic dysfunction of cervical region: Secondary | ICD-10-CM | POA: Diagnosis not present

## 2023-01-16 ENCOUNTER — Ambulatory Visit: Payer: HMO | Attending: Orthopedic Surgery | Admitting: Occupational Therapy

## 2023-01-16 DIAGNOSIS — R278 Other lack of coordination: Secondary | ICD-10-CM | POA: Diagnosis not present

## 2023-01-16 DIAGNOSIS — M6281 Muscle weakness (generalized): Secondary | ICD-10-CM | POA: Diagnosis not present

## 2023-01-16 DIAGNOSIS — R600 Localized edema: Secondary | ICD-10-CM | POA: Insufficient documentation

## 2023-01-16 DIAGNOSIS — R208 Other disturbances of skin sensation: Secondary | ICD-10-CM | POA: Insufficient documentation

## 2023-01-19 ENCOUNTER — Encounter: Payer: Self-pay | Admitting: Occupational Therapy

## 2023-01-20 NOTE — Therapy (Signed)
[facility] [address], [place], [address] Phone: [phone]   Fax:  [phone]  Occupational Therapy Treatment/Recertification  Patient Details  Name: [name] MRN: [id number] Date of Birth: 11/05/1935 No data recorded  Encounter Date: 01/16/2023   OT End of Session - 01/20/23 1153     Visit Number 13    Number of Visits 14    Date for OT Re-Evaluation 02/13/23    OT Start Time 1400    OT Stop Time 1445    OT Time Calculation (min) 45 min    Activity Tolerance Patient tolerated treatment well    Behavior During Therapy [facility] for tasks assessed/performed             Past Medical History:  Diagnosis Date   Arthritis    GERD (gastroesophageal reflux disease)    Hx of basal cell carcinoma 01/13/2013   left parasternal chest   Hx of basal cell carcinoma 04/26/2015   right sacral area   Hx of basal cell carcinoma 04/26/2016   left temple   Hypertension     Past Surgical History:  Procedure Laterality Date   ABDOMINAL HYSTERECTOMY     APPENDECTOMY     BACK SURGERY     COLONOSCOPY WITH PROPOFOL N/A 03/07/2017   Procedure: COLONOSCOPY WITH PROPOFOL;  Surgeon: Toledo, Boykin Nearing, MD;  Location: [facility] ENDOSCOPY;  Service: Gastroenterology;  Laterality: N/A;   ESOPHAGOGASTRODUODENOSCOPY N/A 03/07/2017   Procedure: ESOPHAGOGASTRODUODENOSCOPY (EGD);  Surgeon: Toledo, Boykin Nearing, MD;  Location: [facility] ENDOSCOPY;  Service: Gastroenterology;  Laterality: N/A;   FOOT SURGERY Left 05/2016   JOINT REPLACEMENT Bilateral 2010 2011   REPLACEMENT TOTAL KNEE BILATERAL  2010    There were no vitals filed for this visit.   Subjective Assessment - 01/20/23 1152     Subjective  Pt reports her progress with her hand is "phenomenal".She is very pleased so far.    Pertinent History Per chart, recent MD note:  Ms. Botkins returns today for re-evaluation of her left upper extremity. She is a 87 y.o. female who I last  saw in 07/04/2022. Patient is status post left rTSA with a block with Dr. Olga Millers on 09/23/2021. She states that in PACU she noticed numbness in the radial nerve distribution and was unable to extend her wrist. Since that time she has regained wrist extension. She has not noted improvement in the numbness in the radial nerve distribution. She has had multiple EMG studies which have shown electrodiagnostic evidence of recovery of the radial nerve proper. She returns today for reevaluation. She notes that she has had some new onset swelling at the dorsal aspect of her left hand. She is not sure why this happened. She has not seen any improvement in terms of her finger or thumb function. Examination of the left upper extremity shows she has normal sensation in ulnar and median nerve distributions. She has diminished sensation and pressure perception to light touch in left radial nerve distribution. Skin is intact. Fingers are warm and well perfused with brisk capillary refill and she has a palpable radial pulse. There is no tenderness to palpation throughout. 5/5 triceps. 5/5 brachioradialis. 5/5 left wrist extension through her radial wrist extenders, 0/5 EDC, 0/5 EPL. 0/5 EIP    Patient Stated Goals Pt would like to improve movement and strength of her left hand to complete daily tasks with greater ease and independence.  ADL:  Pt reports she is able to don her earring better, tie her shoes.  Cutting food is improving but still not back to her baseline level.  She is improving with the ability to open containers, typing and picking up small things.    Goals updated to reflect progress.  Measurements taken for recertification, refer to flow sheet for details.   Therapeutic Exercises:  AAROM of digits prior to use of rubber bands. Rubber band exercises for PA and RA of the thumb Pt seen for picking up and manipulation of small objects with use of small marbles, glass beads and regular beads.   Use of chinese balls with assist to move from one side of the hand to the other and then able to perform in one direction after instruction.  Rolling of medium ball to fingertips and up the radial side of the hand and then ulnar side with cues.  Pt able to demonstrate full composite fisting motion and full opposition to the base of her small finger.  She is also now able to point with her index finger again.      [facility] OT Assessment - 01/20/23 1157       Assessment   Medical Diagnosis left radial nerve palsy    Onset Date/Surgical Date 09/23/21    Hand Dominance Right    Prior Therapy yes, at [facility] but has not had therapy in the last 3 months.      Balance Screen   Has the patient fallen in the past 6 months No      ADL   ADL comments --      Cognition   Overall Cognitive Status Within Functional Limits for tasks assessed      Sensation   Light Touch --   decreased sensation around left thumb with some numbness and mild tingling.  Reports index feels normal.     Strength   Right Hand Grip (lbs) 35    Right Hand Lateral Pinch 8 lbs    Right Hand 3 Point Pinch 6 lbs    Left Hand Grip (lbs) 32    Left Hand Lateral Pinch 8 lbs    Left Hand 3 Point Pinch 5 lbs      Right Hand AROM   R Thumb MCP 0-60 40 Degrees    R Thumb IP 0-80 60 Degrees      Left Hand AROM   L Thumb MCP 0-60 40 Degrees    L Thumb IP 0-80 65 Degrees    L Thumb Radial ADduction/ABduction 0-55 50    L Thumb Palmar ADduction/ABduction 0-45 52               OT Education - 01/20/23 1153     Education Details HEP and splint wearing    Person(s) Educated Patient    Methods Explanation;Demonstration;Tactile cues;Verbal cues    Comprehension Verbalized understanding              OT Short Term Goals - 01/20/23 1207       OT SHORT TERM GOAL #1   Title Pt will be independent with HEP for left wrist and hand    Status Achieved               OT Long Term Goals - 01/20/23 1207        OT LONG TERM GOAL #1   Title Pt will demonstrate improve MP extension by 10 degrees to facilitate greater active reach for objects  Baseline MC extention increase for pt to donn glove , put hand in pocket and release objects    Status Achieved      OT LONG TERM GOAL #2   Title Pt will demonstrate improvement of grip by 10# to grasp items without dropping.    Baseline Eval:  left grip limited to 24#, difficulty maintaining grip on objects - NOW increase to 33 lbs -    Time 8    Period Weeks    Status On-going    Target Date 02/13/23      OT LONG TERM GOAL #3   Title Pt will demonstrate improved functional  strength in wrist and hand to operate manual can opener without difficulty.    Status Achieved      OT LONG TERM GOAL #4   Title Pt will demonstrate improved hand function and coordination to engage in meal preparation tasks with modified independence.    Baseline Eval:  difficulty with kitchen tasks such as cracking an egg, holding pots  NOW Can do , open package, cut with scissors, open jars    Status Achieved      OT LONG TERM GOAL #5   Title Pt able to type on computer 2-5 min with more ease    Baseline Difficulty with typing on computer - pecking still with one finger- coordination decrease ind extention out of PIP and MC -    Time 8    Period Weeks    Status On-going    Target Date 02/13/23                   Plan - 01/20/23 1154     Clinical Impression Statement Pt is a 87 yo female who had a fall in Sept 2022 with a traumatic tear repair to her shoulder, she then underwent surgery on Sep 23, 2021 for a reverse shoulder 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  She reports afterward she had numbness along the left radial nerve distribution and decreased hand and wrist motion.  Her last EMG study indicated healing over time. Pt cont to make progress in digits extention , FMC and grip and prehension strenght in L hand. Grip increase to 32 lbs L , R 35 lbs - prehension about same  as R hand. Cont to wear CMC neoprene wrap to keep thumb out of CT - pt to cont to work on [employer] prior to rubber band for PA and RA of thumb.  Cont to show increase individual digit extension when doing opposition to digit and now able to point index finger. Cont to have mild difficulties with maintaining MC extension with wrist extension. and tapping of digits.  Focusing on strengthening MC extension. She still has some difficulty with cutting food but this skill is improving.  Typing has been improving over the last few weeks.  Recommend one more follow up with therapist in 3 weeks to review progress and adjust home program before discharge. Pt would continue to benefit from skilled OT services to maximize her safety and independence with ADL and IADL tasks at home and in the community.    OT Occupational Profile and History Detailed Assessment- Review of Records and additional review of physical, cognitive, psychosocial history related to current functional performance    Occupational performance deficits (Please refer to evaluation for details): ADL's;IADL's;Leisure    Body Structure / Function / Physical Skills ADL;ROM;UE functional use;FMC;Dexterity;Sensation;Edema;Strength;Coordination;IADL    Psychosocial Skills Habits;Routines and Behaviors;Environmental  Adaptations    Rehab Potential  Good    Clinical Decision Making Several treatment options, min-mod task modification necessary    Comorbidities Affecting Occupational Performance: May have comorbidities impacting occupational performance    Modification or Assistance to Complete Evaluation  No modification of tasks or assist necessary to complete eval    OT Frequency Other (comment)   1-2 visits   OT Duration 4 weeks    OT Treatment/Interventions Self-care/ADL training;Electrical Stimulation;Therapeutic exercise;Coping strategies training;Moist Heat;Paraffin;Neuromuscular education;Splinting;Patient/family education;Fluidtherapy;Therapeutic  activities;Cryotherapy;Ultrasound;Contrast Bath;DME and/or AE instruction;Manual Therapy;Passive range of motion    Consulted and Agree with Plan of Care Patient             Patient will benefit from skilled therapeutic intervention in order to improve the following deficits and impairments:   Body Structure / Function / Physical Skills: ADL, ROM, UE functional use, FMC, Dexterity, Sensation, Edema, Strength, Coordination, IADL   Psychosocial Skills: Habits, Routines and Behaviors, Environmental  Adaptations   Visit Diagnosis: Other lack of coordination - Plan: Ot plan of care cert/re-cert  Other disturbances of skin sensation - Plan: Ot plan of care cert/re-cert  Muscle weakness (generalized) - Plan: Ot plan of care cert/re-cert  Localized edema - Plan: Ot plan of care cert/re-cert    Problem List Patient Active Problem List   Diagnosis Date Noted   CVA (cerebral infarction) 11/28/2015   [name], OTR/L, CLT  [name], OT 01/20/2023, 12:19 PM  [facility] [address] [place], [address] Phone: [phone]   Fax:  [phone]  Name: [name] MRN: [id number] Date of Birth: 07-18-1935

## 2023-01-24 DIAGNOSIS — S299XXD Unspecified injury of thorax, subsequent encounter: Secondary | ICD-10-CM | POA: Diagnosis not present

## 2023-01-24 DIAGNOSIS — M19012 Primary osteoarthritis, left shoulder: Secondary | ICD-10-CM | POA: Diagnosis not present

## 2023-01-24 DIAGNOSIS — Z96612 Presence of left artificial shoulder joint: Secondary | ICD-10-CM | POA: Diagnosis not present

## 2023-01-24 DIAGNOSIS — S299XXA Unspecified injury of thorax, initial encounter: Secondary | ICD-10-CM | POA: Diagnosis not present

## 2023-01-24 DIAGNOSIS — M9901 Segmental and somatic dysfunction of cervical region: Secondary | ICD-10-CM | POA: Diagnosis not present

## 2023-01-24 DIAGNOSIS — M955 Acquired deformity of pelvis: Secondary | ICD-10-CM | POA: Diagnosis not present

## 2023-01-24 DIAGNOSIS — M9905 Segmental and somatic dysfunction of pelvic region: Secondary | ICD-10-CM | POA: Diagnosis not present

## 2023-01-24 DIAGNOSIS — W010XXA Fall on same level from slipping, tripping and stumbling without subsequent striking against object, initial encounter: Secondary | ICD-10-CM | POA: Diagnosis not present

## 2023-01-24 DIAGNOSIS — M5033 Other cervical disc degeneration, cervicothoracic region: Secondary | ICD-10-CM | POA: Diagnosis not present

## 2023-01-24 DIAGNOSIS — S2232XA Fracture of one rib, left side, initial encounter for closed fracture: Secondary | ICD-10-CM | POA: Diagnosis not present

## 2023-01-26 DIAGNOSIS — I10 Essential (primary) hypertension: Secondary | ICD-10-CM | POA: Diagnosis not present

## 2023-01-26 DIAGNOSIS — R0781 Pleurodynia: Secondary | ICD-10-CM | POA: Diagnosis not present

## 2023-02-06 ENCOUNTER — Ambulatory Visit: Payer: HMO | Attending: Orthopedic Surgery | Admitting: Occupational Therapy

## 2023-02-06 DIAGNOSIS — R208 Other disturbances of skin sensation: Secondary | ICD-10-CM | POA: Insufficient documentation

## 2023-02-06 DIAGNOSIS — M6281 Muscle weakness (generalized): Secondary | ICD-10-CM | POA: Insufficient documentation

## 2023-02-06 DIAGNOSIS — R278 Other lack of coordination: Secondary | ICD-10-CM | POA: Insufficient documentation

## 2023-02-13 ENCOUNTER — Ambulatory Visit: Payer: HMO | Admitting: Occupational Therapy

## 2023-02-13 DIAGNOSIS — R278 Other lack of coordination: Secondary | ICD-10-CM | POA: Diagnosis not present

## 2023-02-13 DIAGNOSIS — R208 Other disturbances of skin sensation: Secondary | ICD-10-CM | POA: Diagnosis not present

## 2023-02-13 DIAGNOSIS — M6281 Muscle weakness (generalized): Secondary | ICD-10-CM

## 2023-02-13 NOTE — Therapy (Signed)
[facility] [address], [place], [address] Phone: [phone]   Fax:  [phone]  Occupational Therapy Treatment/Discharge  Patient Details  Name: DYANARA BENNION MRN: [id number] Date of Birth: 09-Jul-1935 No data recorded  Encounter Date: 02/13/2023   OT End of Session - 02/13/23 1308     Visit Number 14    Number of Visits 14    Date for OT Re-Evaluation 02/13/23    OT Start Time 1115    OT Stop Time 1154    OT Time Calculation (min) 39 min    Activity Tolerance Patient tolerated treatment well    Behavior During Therapy [facility] for tasks assessed/performed             Past Medical History:  Diagnosis Date   Arthritis    GERD (gastroesophageal reflux disease)    Hx of basal cell carcinoma 01/13/2013   left parasternal chest   Hx of basal cell carcinoma 04/26/2015   right sacral area   Hx of basal cell carcinoma 04/26/2016   left temple   Hypertension     Past Surgical History:  Procedure Laterality Date   ABDOMINAL HYSTERECTOMY     APPENDECTOMY     BACK SURGERY     COLONOSCOPY WITH PROPOFOL N/A 03/07/2017   Procedure: COLONOSCOPY WITH PROPOFOL;  Surgeon: Toledo, Boykin Nearing, MD;  Location: [facility] ENDOSCOPY;  Service: Gastroenterology;  Laterality: N/A;   ESOPHAGOGASTRODUODENOSCOPY N/A 03/07/2017   Procedure: ESOPHAGOGASTRODUODENOSCOPY (EGD);  Surgeon: Toledo, Boykin Nearing, MD;  Location: [facility] ENDOSCOPY;  Service: Gastroenterology;  Laterality: N/A;   FOOT SURGERY Left 05/2016   JOINT REPLACEMENT Bilateral 2010 2011   REPLACEMENT TOTAL KNEE BILATERAL  2010    There were no vitals filed for this visit.   Subjective Assessment - 02/13/23 1306     Subjective  I wanted to see you one more time prior to follow up this Thursday wtih surgeon- I am able to do everything - type on computer, pick up small objects, put on glove and hand in pocket - carry and hold object- my hand is 100% better if not more     Pertinent History Per chart, recent MD note:  Ms. Rierson returns today for re-evaluation of her left upper extremity. She is a 87 y.o. female who I last saw in 07/04/2022. Patient is status post left rTSA with a block with Dr. Olga Millers on 09/23/2021. She states that in PACU she noticed numbness in the radial nerve distribution and was unable to extend her wrist. Since that time she has regained wrist extension. She has not noted improvement in the numbness in the radial nerve distribution. She has had multiple EMG studies which have shown electrodiagnostic evidence of recovery of the radial nerve proper. She returns today for reevaluation. She notes that she has had some new onset swelling at the dorsal aspect of her left hand. She is not sure why this happened. She has not seen any improvement in terms of her finger or thumb function. Examination of the left upper extremity shows she has normal sensation in ulnar and median nerve distributions. She has diminished sensation and pressure perception to light touch in left radial nerve distribution. Skin is intact. Fingers are warm and well perfused with brisk capillary refill and she has a palpable radial pulse. There is no tenderness to palpation throughout. 5/5 triceps. 5/5 brachioradialis. 5/5 left wrist extension through her radial wrist extenders, 0/5 EDC, 0/5 EPL. 0/5 EIP  Patient Stated Goals Pt would like to improve movement and strength of her left hand to complete daily tasks with greater ease and independence.    Currently in Pain? No/denies                [facility] OT Assessment - 02/13/23 0001       AROM   Left Wrist Extension 70 Degrees    Left Wrist Flexion 80 Degrees      Strength   Right Hand Grip (lbs) 35    Right Hand Lateral Pinch 8 lbs    Right Hand 3 Point Pinch 6 lbs    Left Hand Grip (lbs) 32    Left Hand Lateral Pinch 8 lbs    Left Hand 3 Point Pinch 5 lbs               Coming in - place and hold able to maintain  [facility] extention of 3rd thru 5th at 0 degrees and off table - and able to maintain [facility] extention with wrist extention  Still  unable to tolerate manual resistance             Cont to report typing better    And pick up alternate digits - opposition -and keeping other digits in extention And making different signs with digits extention Cont CMC neoprene splint - pt report numbness in thumb at thumb IP now  -numbness better than few months ago - preventing CMC collapsing into CT Cont AAROM  thumb PA and RA - followed by rubberband for PA and RA of thumb place and hold   Review again with pt rubber band to wrist cuff -and able to do digits  MC extention place and hold  12 reps  With gravity and against gravity this date  Reinforce again stop when fatigue - quality more important than quantity Pt able to use L hand in ADL's and IADl's - no issues reported                      OT Education - 02/13/23 1307     Education Details progress and discharge instructions    Person(s) Educated Patient    Methods Explanation;Demonstration;Tactile cues;Verbal cues    Comprehension Verbalized understanding              OT Short Term Goals - 01/20/23 1207       OT SHORT TERM GOAL #1   Title Pt will be independent with HEP for left wrist and hand    Status Achieved               OT Long Term Goals - 02/13/23 1312       OT LONG TERM GOAL #1   Baseline MC extention increase for pt to donn glove , put hand in pocket and release objects    Status Achieved      OT LONG TERM GOAL #2   Title Pt will demonstrate improvement of grip by 10# to grasp items without dropping.    Status Achieved      OT LONG TERM GOAL #3   Title Pt will demonstrate improved functional  strength in wrist and hand to operate manual can opener without difficulty.    Status Achieved      OT LONG TERM GOAL #4   Title Pt will demonstrate improved hand function and coordination to engage in meal  preparation tasks with modified independence.    Status Achieved  OT LONG TERM GOAL #5   Title Pt able to type on computer 2-5 min with more ease    Status Achieved                   Plan - 02/13/23 1308     Clinical Impression Statement Pt is a 87 yo female who had a fall in Sept 2022 with a traumatic tear repair to her shoulder, she then underwent surgery on Sep 23, 2021 for a reverse shoulder 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  She reports afterward she had numbness along the left radial nerve distribution and decreased hand and wrist motion.  Pt phenomenal progress from [facility] with this OT - Able to maintain digits extention with wrist extention. PRogress greatly in  [facility] and grip and prehension strenght in L hand. Grip increase to 32 lbs L , R 35 lbs - prehension about same as R hand. 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  Cont to wear CMC neoprene wrap to keep thumb CMC out of CT -  Pt cont ot have some numbness in thumb IP.  Pt cont to work on [employer] for thumb PA and RA - digits extention prior to rubber band HEP place and hold for  PA and RA of thumb AND digit MC extention.  Pt to cont to foucs on strengthening MC extension.  Pt can cont with HEP at home and is following up with surgeon later this week.    OT Occupational Profile and History Detailed Assessment- Review of Records and additional review of physical, cognitive, psychosocial history related to current functional performance    Occupational performance deficits (Please refer to evaluation for details): ADL's;IADL's;Leisure    Body Structure / Function / Physical Skills ADL;ROM;UE functional use;FMC;Dexterity;Sensation;Edema;Strength;Coordination;IADL    Psychosocial Skills Habits;Routines and Behaviors;Environmental  Adaptations    Rehab Potential Good    Clinical Decision Making Several treatment options, min-mod task modification necessary    Comorbidities Affecting Occupational Performance: May have comorbidities impacting  occupational performance    Modification or Assistance to Complete Evaluation  No modification of tasks or assist necessary to complete eval    OT Treatment/Interventions Self-care/ADL training;Electrical Stimulation;Therapeutic exercise;Coping strategies training;Moist Heat;Paraffin;Neuromuscular education;Splinting;Patient/family education;Fluidtherapy;Therapeutic activities;Cryotherapy;Ultrasound;Contrast Bath;DME and/or AE instruction;Manual Therapy;Passive range of motion    Consulted and Agree with Plan of Care Patient             Patient will benefit from skilled therapeutic intervention in order to improve the following deficits and impairments:   Body Structure / Function / Physical Skills: ADL, ROM, UE functional use, FMC, Dexterity, Sensation, Edema, Strength, Coordination, IADL   Psychosocial Skills: Habits, Routines and Behaviors, Environmental  Adaptations   Visit Diagnosis: Other lack of coordination  Muscle weakness (generalized)  Other disturbances of skin sensation    Problem List Patient Active Problem List   Diagnosis Date Noted   CVA (cerebral infarction) 11/28/2015    Oletta Cohn, OTR/L,CLT 02/13/2023, 1:13 PM  [facility] [address] [place], [address] Phone: [phone]   Fax:  [phone]  Name: ELSA LAD MRN: [id number] Date of Birth: 1935/10/11

## 2023-02-15 DIAGNOSIS — Z96612 Presence of left artificial shoulder joint: Secondary | ICD-10-CM | POA: Diagnosis not present

## 2023-02-15 DIAGNOSIS — S2232XA Fracture of one rib, left side, initial encounter for closed fracture: Secondary | ICD-10-CM | POA: Diagnosis not present

## 2023-02-15 DIAGNOSIS — G5632 Lesion of radial nerve, left upper limb: Secondary | ICD-10-CM | POA: Diagnosis not present

## 2023-03-02 DIAGNOSIS — R600 Localized edema: Secondary | ICD-10-CM | POA: Diagnosis not present

## 2023-03-02 DIAGNOSIS — K2100 Gastro-esophageal reflux disease with esophagitis, without bleeding: Secondary | ICD-10-CM | POA: Diagnosis not present

## 2023-03-02 DIAGNOSIS — L03115 Cellulitis of right lower limb: Secondary | ICD-10-CM | POA: Diagnosis not present

## 2023-03-02 DIAGNOSIS — D649 Anemia, unspecified: Secondary | ICD-10-CM | POA: Diagnosis not present

## 2023-03-02 DIAGNOSIS — E7801 Familial hypercholesterolemia: Secondary | ICD-10-CM | POA: Diagnosis not present

## 2023-03-02 DIAGNOSIS — I6330 Cerebral infarction due to thrombosis of unspecified cerebral artery: Secondary | ICD-10-CM | POA: Diagnosis not present

## 2023-03-02 DIAGNOSIS — I10 Essential (primary) hypertension: Secondary | ICD-10-CM | POA: Diagnosis not present

## 2023-03-06 DIAGNOSIS — G5632 Lesion of radial nerve, left upper limb: Secondary | ICD-10-CM | POA: Diagnosis not present

## 2023-03-09 DIAGNOSIS — R600 Localized edema: Secondary | ICD-10-CM | POA: Diagnosis not present

## 2023-03-09 DIAGNOSIS — K2100 Gastro-esophageal reflux disease with esophagitis, without bleeding: Secondary | ICD-10-CM | POA: Diagnosis not present

## 2023-03-09 DIAGNOSIS — I10 Essential (primary) hypertension: Secondary | ICD-10-CM | POA: Diagnosis not present

## 2023-03-09 DIAGNOSIS — E7801 Familial hypercholesterolemia: Secondary | ICD-10-CM | POA: Diagnosis not present

## 2023-03-09 DIAGNOSIS — M810 Age-related osteoporosis without current pathological fracture: Secondary | ICD-10-CM | POA: Diagnosis not present

## 2023-03-09 DIAGNOSIS — D649 Anemia, unspecified: Secondary | ICD-10-CM | POA: Diagnosis not present

## 2023-03-09 DIAGNOSIS — I6330 Cerebral infarction due to thrombosis of unspecified cerebral artery: Secondary | ICD-10-CM | POA: Diagnosis not present

## 2023-03-21 DIAGNOSIS — K089 Disorder of teeth and supporting structures, unspecified: Secondary | ICD-10-CM | POA: Insufficient documentation

## 2023-04-03 DIAGNOSIS — L84 Corns and callosities: Secondary | ICD-10-CM | POA: Diagnosis not present

## 2023-04-03 DIAGNOSIS — M79672 Pain in left foot: Secondary | ICD-10-CM | POA: Diagnosis not present

## 2023-04-04 DIAGNOSIS — M9905 Segmental and somatic dysfunction of pelvic region: Secondary | ICD-10-CM | POA: Diagnosis not present

## 2023-04-04 DIAGNOSIS — M955 Acquired deformity of pelvis: Secondary | ICD-10-CM | POA: Diagnosis not present

## 2023-04-04 DIAGNOSIS — M5033 Other cervical disc degeneration, cervicothoracic region: Secondary | ICD-10-CM | POA: Diagnosis not present

## 2023-04-04 DIAGNOSIS — M9901 Segmental and somatic dysfunction of cervical region: Secondary | ICD-10-CM | POA: Diagnosis not present

## 2023-05-07 IMAGING — MR MR SHOULDER*L* W/O CM
4 of 5 series · 30 of 40 positions shown · non-contrast
Comparison: None.

CLINICAL DATA: Left shoulder pain after fall 9 days ago

EXAM:
MRI OF THE LEFT SHOULDER WITHOUT CONTRAST
TECHNIQUE: Multiplanar, multisequence MR imaging of the shoulder was performed.
No intravenous contrast was administered.

[Series 5: T2 fat-sat · axial · left · 4.0mm · 0.44mm/px · z∈[-43,+70]mm · 8 of 26 slices shown (1 of 3)]
[im 1/26]
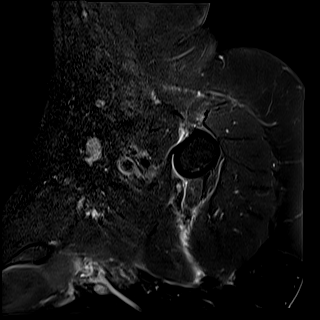
[im 4/26]
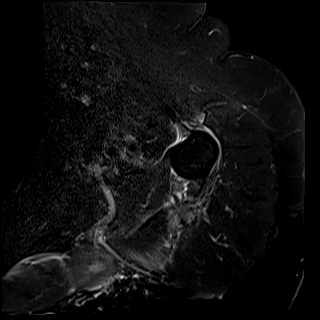
[im 8/26]
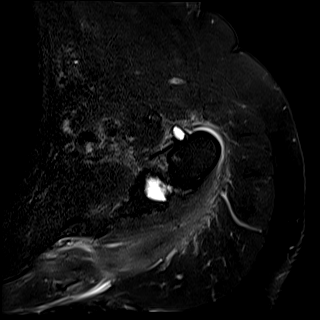
[im 11/26]
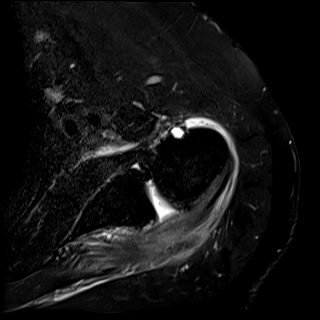
[im 15/26]
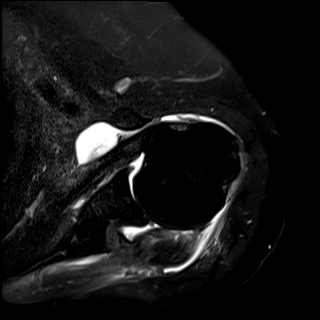
[im 18/26]
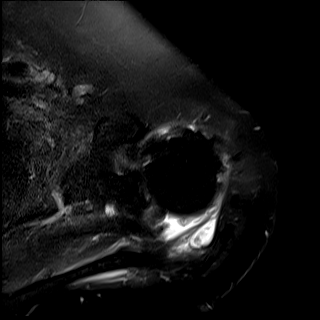
[im 22/26]
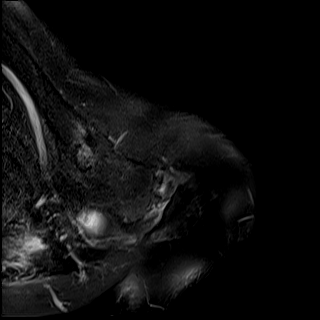
[im 26/26]
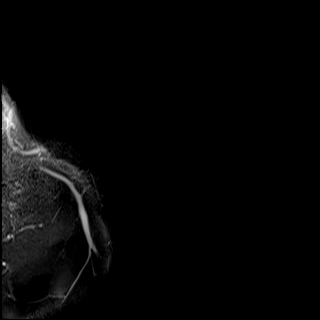

[Series 6: PD · oblique · left · 4.0mm · 0.44mm/px · 8 of 26 slices shown]
[im 1/26]
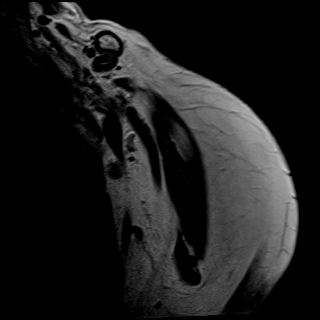
[im 4/26]
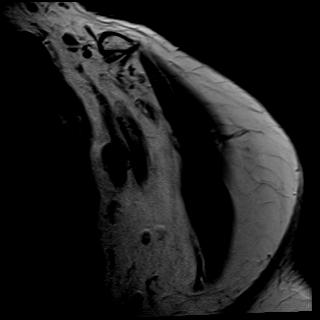
[im 8/26]
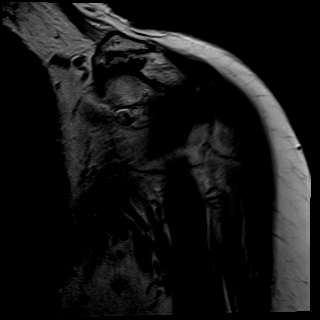
[im 11/26]
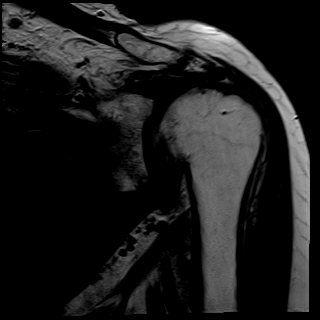
[im 15/26]
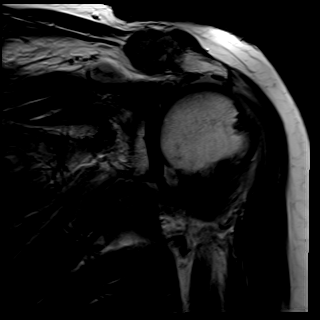
[im 18/26]
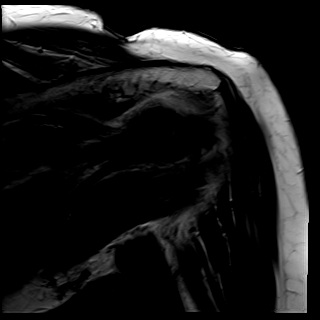
[im 22/26]
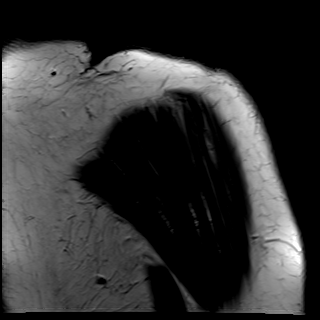
[im 26/26]
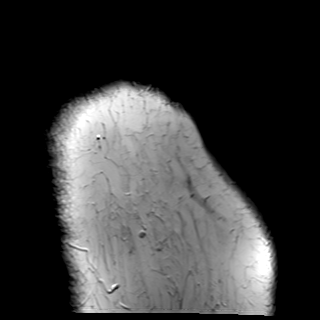

[Series 7: T2 fat-sat · oblique · left · 4.0mm · 0.44mm/px · 8 of 26 slices shown (2 of 3)]
[im 1/26]
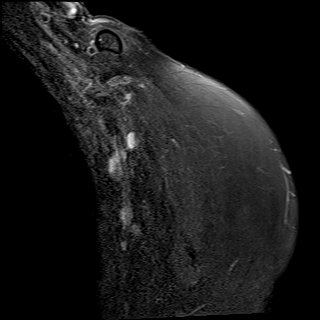
[im 4/26]
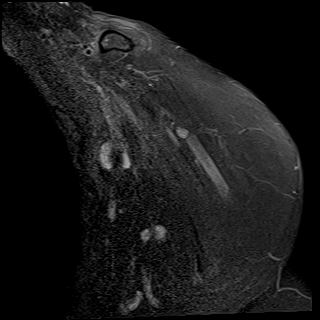
[im 8/26]
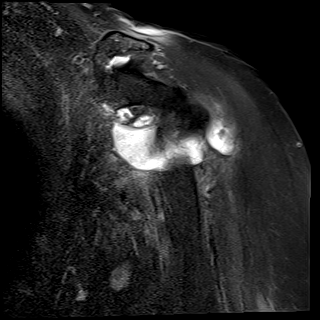
[im 11/26]
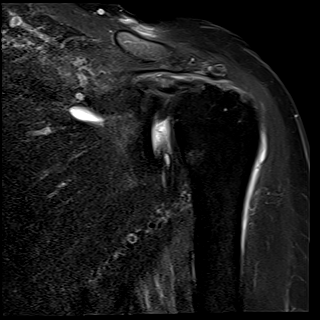
[im 15/26]
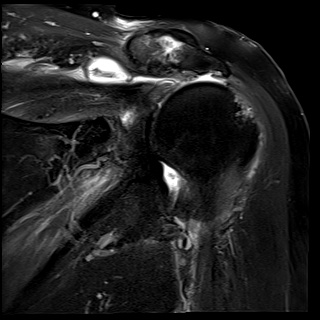
[im 18/26]
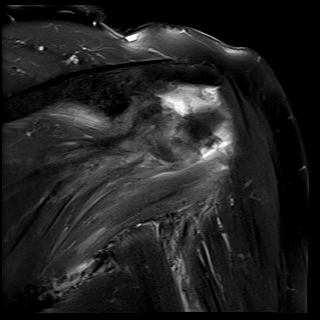
[im 22/26]
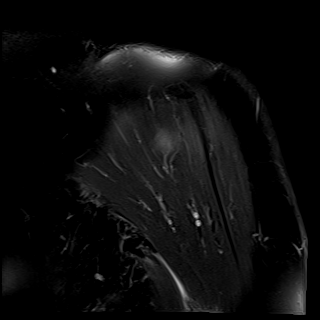
[im 26/26]
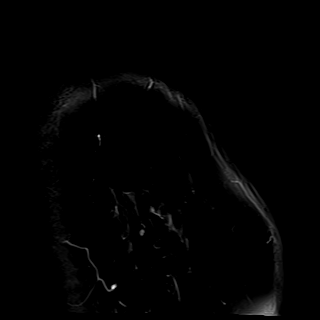

[Series 8: T2 fat-sat · oblique · left · 4.0mm · 0.22mm/px · 6 of 26 slices shown (3 of 3)]
[im 1/26]
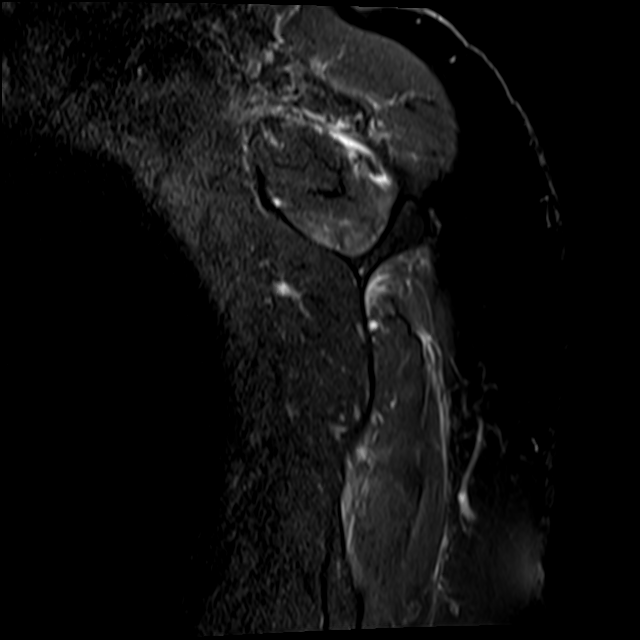
[im 4/26]
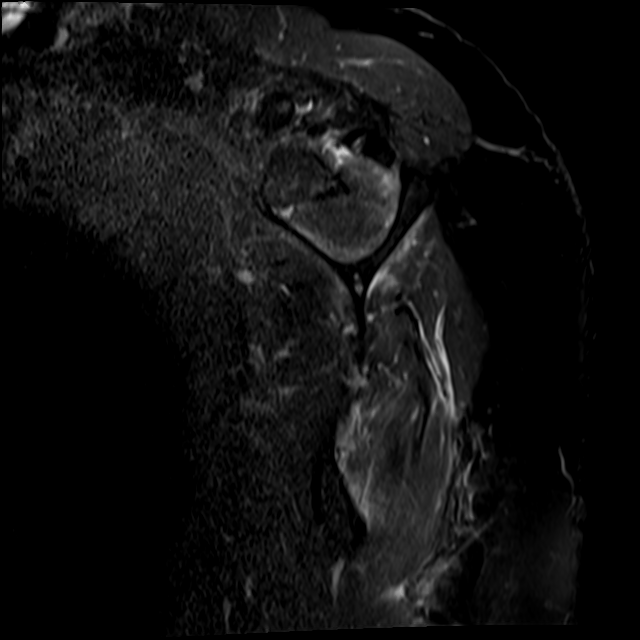
[im 8/26]
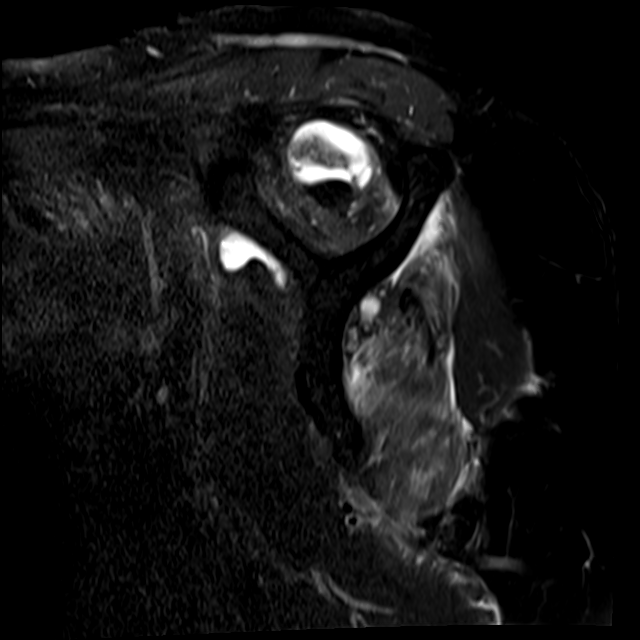
[im 11/26]
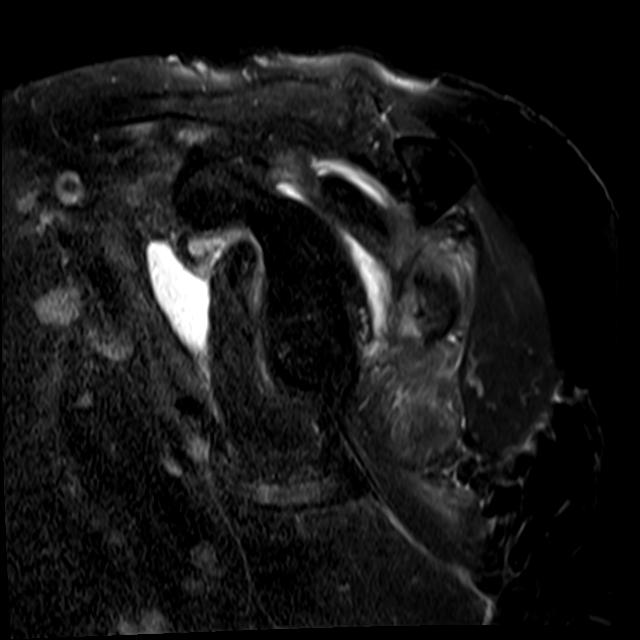
[im 15/26]
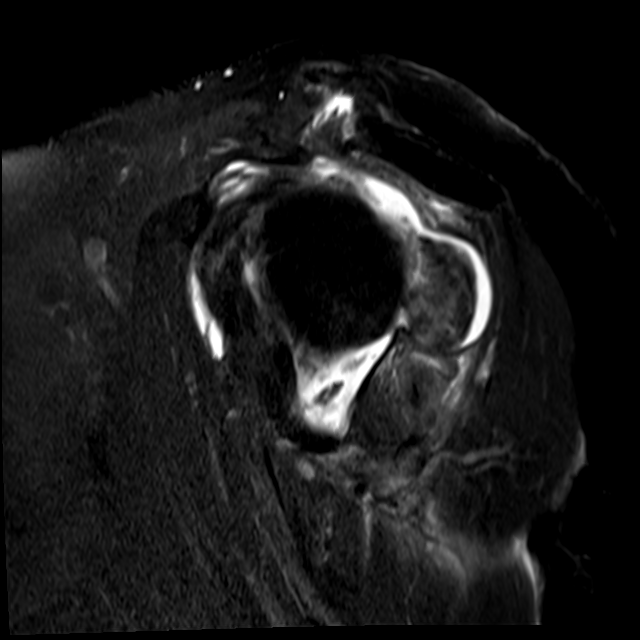
[im 22/26]
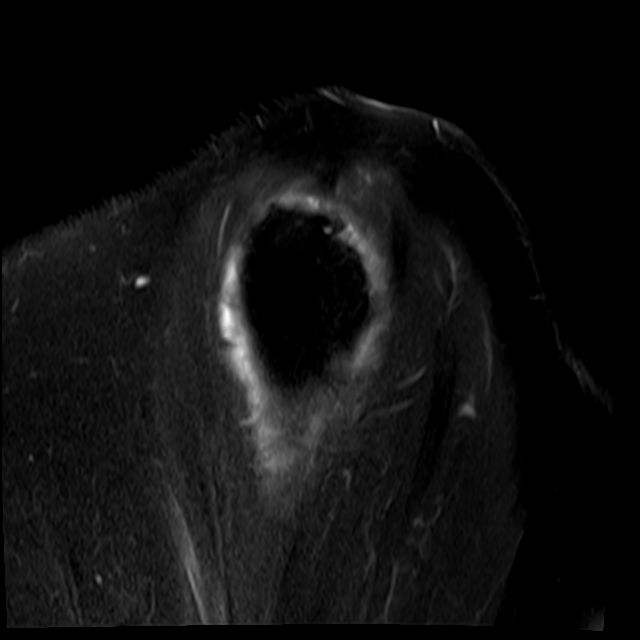

[30 of 40 positions shown; findings below may reference images not displayed]

FINDINGS: Rotator cuff: Complete full-thickness tears of the supraspinatus and
infraspinatus tendons retracted to the level of the glenohumeral
joint. Infraspinatus tendon intact with moderate tendinosis. Intact
teres minor.

Muscles: Prominent intramuscular edema within the supraspinatus,
infraspinatus, and teres minor muscles. No muscle atrophy or fatty
infiltration.

Biceps long head: Nonvisualization of the long head biceps tendon,
likely torn and retracted.

Acromioclavicular Joint: Moderate arthropathy of the AC joint. Small
volume subacromial-subdeltoid bursal fluid communicating with the
glenohumeral joint. Internal complexity within the bursal space
could reflect bursitis or a component of internal blood products.

Glenohumeral Joint: Diffuse chondral thinning. Humeral head is
posteriorly and superiorly subluxed relative to the glenoid.
Small-moderate-sized glenohumeral joint effusion. Small loose body
within the axillary pouch.

Labrum:  Posterior labrum appears diminutive.

Bones: No acute fracture or dislocation. No suspicious bone lesion.

Other: None.
IMPRESSION: 1. Complete full-thickness tears of the supraspinatus and
infraspinatus tendons retracted to the level of the glenohumeral
joint.
2. Prominent intramuscular edema within the supraspinatus,
infraspinatus, and teres minor muscles.
3. Nonvisualization of the long head biceps tendon, likely torn and
retracted.
4. Mild-moderate glenohumeral and acromioclavicular osteoarthritis.

## 2023-05-09 DIAGNOSIS — M955 Acquired deformity of pelvis: Secondary | ICD-10-CM | POA: Diagnosis not present

## 2023-05-09 DIAGNOSIS — M5033 Other cervical disc degeneration, cervicothoracic region: Secondary | ICD-10-CM | POA: Diagnosis not present

## 2023-05-09 DIAGNOSIS — M9905 Segmental and somatic dysfunction of pelvic region: Secondary | ICD-10-CM | POA: Diagnosis not present

## 2023-05-09 DIAGNOSIS — M9901 Segmental and somatic dysfunction of cervical region: Secondary | ICD-10-CM | POA: Diagnosis not present

## 2023-05-22 DIAGNOSIS — M79674 Pain in right toe(s): Secondary | ICD-10-CM | POA: Diagnosis not present

## 2023-05-22 DIAGNOSIS — M9901 Segmental and somatic dysfunction of cervical region: Secondary | ICD-10-CM | POA: Diagnosis not present

## 2023-05-22 DIAGNOSIS — M79675 Pain in left toe(s): Secondary | ICD-10-CM | POA: Diagnosis not present

## 2023-05-22 DIAGNOSIS — M955 Acquired deformity of pelvis: Secondary | ICD-10-CM | POA: Diagnosis not present

## 2023-05-22 DIAGNOSIS — M5033 Other cervical disc degeneration, cervicothoracic region: Secondary | ICD-10-CM | POA: Diagnosis not present

## 2023-05-22 DIAGNOSIS — M9905 Segmental and somatic dysfunction of pelvic region: Secondary | ICD-10-CM | POA: Diagnosis not present

## 2023-05-22 DIAGNOSIS — B351 Tinea unguium: Secondary | ICD-10-CM | POA: Diagnosis not present

## 2023-06-04 DIAGNOSIS — Z961 Presence of intraocular lens: Secondary | ICD-10-CM | POA: Diagnosis not present

## 2023-06-04 DIAGNOSIS — Z947 Corneal transplant status: Secondary | ICD-10-CM | POA: Diagnosis not present

## 2023-06-05 DIAGNOSIS — M9905 Segmental and somatic dysfunction of pelvic region: Secondary | ICD-10-CM | POA: Diagnosis not present

## 2023-06-05 DIAGNOSIS — M955 Acquired deformity of pelvis: Secondary | ICD-10-CM | POA: Diagnosis not present

## 2023-06-05 DIAGNOSIS — M9901 Segmental and somatic dysfunction of cervical region: Secondary | ICD-10-CM | POA: Diagnosis not present

## 2023-06-05 DIAGNOSIS — M5033 Other cervical disc degeneration, cervicothoracic region: Secondary | ICD-10-CM | POA: Diagnosis not present

## 2023-06-20 ENCOUNTER — Ambulatory Visit: Payer: PPO | Admitting: Dermatology

## 2023-06-20 DIAGNOSIS — M9905 Segmental and somatic dysfunction of pelvic region: Secondary | ICD-10-CM | POA: Diagnosis not present

## 2023-06-20 DIAGNOSIS — M9901 Segmental and somatic dysfunction of cervical region: Secondary | ICD-10-CM | POA: Diagnosis not present

## 2023-06-20 DIAGNOSIS — M5033 Other cervical disc degeneration, cervicothoracic region: Secondary | ICD-10-CM | POA: Diagnosis not present

## 2023-06-20 DIAGNOSIS — M955 Acquired deformity of pelvis: Secondary | ICD-10-CM | POA: Diagnosis not present

## 2023-06-21 ENCOUNTER — Encounter: Payer: Self-pay | Admitting: Dermatology

## 2023-06-21 ENCOUNTER — Ambulatory Visit: Payer: HMO | Admitting: Dermatology

## 2023-06-21 DIAGNOSIS — L578 Other skin changes due to chronic exposure to nonionizing radiation: Secondary | ICD-10-CM | POA: Diagnosis not present

## 2023-06-21 DIAGNOSIS — M810 Age-related osteoporosis without current pathological fracture: Secondary | ICD-10-CM | POA: Insufficient documentation

## 2023-06-21 DIAGNOSIS — I38 Endocarditis, valve unspecified: Secondary | ICD-10-CM | POA: Insufficient documentation

## 2023-06-21 DIAGNOSIS — D229 Melanocytic nevi, unspecified: Secondary | ICD-10-CM

## 2023-06-21 DIAGNOSIS — Z1283 Encounter for screening for malignant neoplasm of skin: Secondary | ICD-10-CM | POA: Diagnosis not present

## 2023-06-21 DIAGNOSIS — D1801 Hemangioma of skin and subcutaneous tissue: Secondary | ICD-10-CM

## 2023-06-21 DIAGNOSIS — Z96612 Presence of left artificial shoulder joint: Secondary | ICD-10-CM | POA: Insufficient documentation

## 2023-06-21 DIAGNOSIS — D492 Neoplasm of unspecified behavior of bone, soft tissue, and skin: Secondary | ICD-10-CM

## 2023-06-21 DIAGNOSIS — Z872 Personal history of diseases of the skin and subcutaneous tissue: Secondary | ICD-10-CM | POA: Diagnosis not present

## 2023-06-21 DIAGNOSIS — E785 Hyperlipidemia, unspecified: Secondary | ICD-10-CM | POA: Insufficient documentation

## 2023-06-21 DIAGNOSIS — C44311 Basal cell carcinoma of skin of nose: Secondary | ICD-10-CM

## 2023-06-21 DIAGNOSIS — L814 Other melanin hyperpigmentation: Secondary | ICD-10-CM | POA: Diagnosis not present

## 2023-06-21 DIAGNOSIS — W908XXA Exposure to other nonionizing radiation, initial encounter: Secondary | ICD-10-CM

## 2023-06-21 DIAGNOSIS — L821 Other seborrheic keratosis: Secondary | ICD-10-CM | POA: Diagnosis not present

## 2023-06-21 DIAGNOSIS — Z85828 Personal history of other malignant neoplasm of skin: Secondary | ICD-10-CM | POA: Diagnosis not present

## 2023-06-21 DIAGNOSIS — K219 Gastro-esophageal reflux disease without esophagitis: Secondary | ICD-10-CM | POA: Insufficient documentation

## 2023-06-21 DIAGNOSIS — Z947 Corneal transplant status: Secondary | ICD-10-CM | POA: Insufficient documentation

## 2023-06-21 DIAGNOSIS — C4491 Basal cell carcinoma of skin, unspecified: Secondary | ICD-10-CM

## 2023-06-21 DIAGNOSIS — D649 Anemia, unspecified: Secondary | ICD-10-CM | POA: Insufficient documentation

## 2023-06-21 HISTORY — DX: Basal cell carcinoma of skin, unspecified: C44.91

## 2023-06-21 NOTE — Patient Instructions (Addendum)
Wound Care Instructions  Cleanse wound gently with soap and water once a day then pat dry with clean gauze. Apply a thin coat of Petrolatum (petroleum jelly, "Vaseline") over the wound (unless you have an allergy to this). We recommend that you use a new, sterile tube of Vaseline. Do not pick or remove scabs. Do not remove the yellow or white "healing tissue" from the base of the wound.  Cover the wound with fresh, clean, nonstick gauze and secure with paper tape. You may use Band-Aids in place of gauze and tape if the wound is small enough, but would recommend trimming much of the tape off as there is often too much. Sometimes Band-Aids can irritate the skin.  You should call the office for your biopsy report after 1 week if you have not already been contacted.  If you experience any problems, such as abnormal amounts of bleeding, swelling, significant bruising, significant pain, or evidence of infection, please call the office immediately.  FOR ADULT SURGERY PATIENTS: If you need something for pain relief you may take 1 extra strength Tylenol (acetaminophen) AND 2 Ibuprofen (200mg  each) together every 4 hours as needed for pain. (do not take these if you are allergic to them or if you have a reason you should not take them.) Typically, you may only need pain medication for 1 to 3 days.       Recommend daily broad spectrum sunscreen SPF 30+ to sun-exposed areas, reapply every 2 hours as needed. Call for new or changing lesions.  Staying in the shade or wearing long sleeves, sun glasses (UVA+UVB protection) and wide brim hats (4-inch brim around the entire circumference of the hat) are also recommended for sun protection.    Melanoma ABCDEs  Melanoma is the most dangerous type of skin cancer, and is the leading cause of death from skin disease.  You are more likely to develop melanoma if you: Have light-colored skin, light-colored eyes, or red or blond hair Spend a lot of time in the sun Tan  regularly, either outdoors or in a tanning bed Have had blistering sunburns, especially during childhood Have a close family member who has had a melanoma Have atypical moles or large birthmarks  Early detection of melanoma is key since treatment is typically straightforward and cure rates are extremely high if we catch it early.   The first sign of melanoma is often a change in a mole or a new dark spot.  The ABCDE system is a way of remembering the signs of melanoma.  A for asymmetry:  The two halves do not match. B for border:  The edges of the growth are irregular. C for color:  A mixture of colors are present instead of an even brown color. D for diameter:  Melanomas are usually (but not always) greater than 6mm - the size of a pencil eraser. E for evolution:  The spot keeps changing in size, shape, and color.  Please check your skin once per month between visits. You can use a small mirror in front and a large mirror behind you to keep an eye on the back side or your body.   If you see any new or changing lesions before your next follow-up, please call to schedule a visit.  Please continue daily skin protection including broad spectrum sunscreen SPF 30+ to sun-exposed areas, reapplying every 2 hours as needed when you're outdoors.   Staying in the shade or wearing long sleeves, sun glasses (UVA+UVB protection) and  wide brim hats (4-inch brim around the entire circumference of the hat) are also recommended for sun protection.     Due to recent changes in healthcare laws, you may see results of your pathology and/or laboratory studies on MyChart before the doctors have had a chance to review them. We understand that in some cases there may be results that are confusing or concerning to you. Please understand that not all results are received at the same time and often the doctors may need to interpret multiple results in order to provide you with the best plan of care or course of  treatment. Therefore, we ask that you please give [place] 2 business days to thoroughly review all your results before contacting the office for clarification. Should we see a critical lab result, you will be contacted sooner.   If You Need Anything After Your Visit  If you have any questions or concerns for your doctor, please call our main line at [phone] and press option 4 to reach your doctor's medical assistant. If no one answers, please leave a voicemail as directed and we will return your call as soon as possible. Messages left after 4 pm will be answered the following business day.   You may also send [place] a message via MyChart. We typically respond to MyChart messages within 1-2 business days.  For prescription refills, please ask your pharmacy to contact our office. Our fax number is [phone].  If you have an urgent issue when the clinic is closed that cannot wait until the next business day, you can page your doctor at the number below.    Please note that while we do our best to be available for urgent issues outside of office hours, we are not available 24/7.   If you have an urgent issue and are unable to reach [place], you may choose to seek medical care at your doctor's office, retail clinic, urgent care center, or emergency room.  If you have a medical emergency, please immediately call 911 or go to the emergency department.  Pager Numbers  - Dr. Gwen Pounds: [phone]  - Dr. Roseanne Reno: [phone]  - Dr. Katrinka Blazing: [phone]   In the event of inclement weather, please call our main line at [phone] for an update on the status of any delays or closures.  Dermatology Medication Tips: Please keep the boxes that topical medications come in in order to help keep track of the instructions about where and how to use these. Pharmacies typically print the medication instructions only on the boxes and not directly on the medication tubes.   If your medication is too expensive,  please contact our office at [phone] option 4 or send [place] a message through MyChart.   We are unable to tell what your co-pay for medications will be in advance as this is different depending on your insurance coverage. However, we may be able to find a substitute medication at lower cost or fill out paperwork to get insurance to cover a needed medication.   If a prior authorization is required to get your medication covered by your insurance company, please allow [place] 1-2 business days to complete this process.  Drug prices often vary depending on where the prescription is filled and some pharmacies may offer cheaper prices.  The website www.goodrx.com contains coupons for medications through different pharmacies. The prices here do not account for what the cost may be with help from insurance (it may be cheaper with your insurance), but the website  can give you the price if you did not use any insurance.  - You can print the associated coupon and take it with your prescription to the pharmacy.  - You may also stop by our office during regular business hours and pick up a GoodRx coupon card.  - If you need your prescription sent electronically to a different pharmacy, notify our office through [facility] or by phone at [phone] option 4.     Si Usted Necesita Algo Despus de Su Visita  Tambin puede enviarnos un mensaje a travs de Clinical cytogeneticist. Por lo general respondemos a los mensajes de MyChart en el transcurso de 1 a 2 das hbiles.  Para renovar recetas, por favor pida a su farmacia que se ponga en contacto con nuestra oficina. Annie Sable de fax es [place] [phone].  Si tiene un asunto urgente cuando la clnica est cerrada y que no puede esperar hasta el siguiente da hbil, puede llamar/localizar a su doctor(a) al nmero que aparece a continuacin.   Por favor, tenga en cuenta que aunque hacemos todo lo posible para estar disponibles para asuntos urgentes fuera del  horario de [employer], no estamos disponibles las 24 horas del da, los 7 [address]  [address] de la [place].   Si tiene un problema urgente y no puede comunicarse con nosotros, puede optar por buscar atencin mdica  en el consultorio de su doctor(a), en una clnica privada, en un centro de atencin urgente o en una sala de emergencias.  Si tiene Engineer, drilling, por favor llame inmediatamente al 911 o vaya a la sala de emergencias.  Nmeros de bper  - Dr. Gwen Pounds: [phone]  - Dra. Roseanne Reno: [phone]  - Dr. Katrinka Blazing: [phone]   En caso de inclemencias del tiempo, por favor llame a Lacy Duverney principal al [phone] para una actualizacin sobre el [place] de cualquier retraso o cierre.  Consejos para la medicacin en dermatologa: Por favor, guarde las cajas en las que vienen los medicamentos de uso tpico para ayudarle a seguir las instrucciones sobre dnde y cmo usarlos. Las farmacias generalmente imprimen las instrucciones del medicamento slo en las cajas y no directamente en los tubos del [place].   Si su medicamento es muy caro, por favor, pngase en contacto con Rolm Gala llamando al [phone] y presione la opcin 4 o envenos un mensaje a travs de Clinical cytogeneticist.   No podemos decirle cul ser su copago por los medicamentos por adelantado ya que esto es diferente dependiendo de la cobertura de su seguro. Sin embargo, es posible que podamos encontrar un medicamento sustituto a Audiological scientist un formulario para que el seguro cubra el medicamento que se considera necesario.   Si se requiere una autorizacin previa para que su compaa de seguros [place] su medicamento, por favor permtanos de 1 a 2 das hbiles para completar [address]  Los precios de los medicamentos varan con frecuencia dependiendo del Environmental consultant de dnde se surte la receta y alguna farmacias pueden ofrecer precios ms baratos.  El sitio web www.goodrx.com tiene cupones para medicamentos de Engineer, civil (consulting). Los precios aqu no tienen en cuenta lo que podra costar con la ayuda del seguro (puede ser ms barato con su seguro), pero el sitio web puede darle el precio si no utiliz Tourist information centre manager.  - Puede imprimir el cupn correspondiente y llevarlo con su receta a la farmacia.  - Tambin puede pasar por nuestra oficina durante el horario de atencin regular y Education officer, museum una tarjeta de cupones de GoodRx.  -  Si necesita que su receta se enve electrnicamente a Psychiatrist, informe a nuestra oficina a travs de MyChart de [place] o por telfono llamando al [phone] y presione la opcin 4.

## 2023-06-21 NOTE — Progress Notes (Signed)
Follow-Up Visit   Subjective  [name] is a 88 y.o. female who presents for the following: Skin Cancer Screening and Full Body Skin Exam. Hx of BCCs. Hx of AKs.  Recheck right side of nose. Tx last year with LN2. Dx: AK. Pink, scaly.   The patient presents for Total-Body Skin Exam (TBSE) for skin cancer screening and mole check. The patient has spots, moles and lesions to be evaluated, some may be new or changing and the patient may have concern these could be cancer.    The following portions of the chart were reviewed this encounter and updated as appropriate: medications, allergies, medical history  Review of Systems:  No other skin or systemic complaints except as noted in HPI or Assessment and Plan.  Objective  Well appearing patient in no apparent distress; mood and affect are within normal limits.  A full examination was performed including scalp, head, eyes, ears, nose, lips, neck, chest, axillae, abdomen, back, buttocks, bilateral upper extremities, bilateral lower extremities, hands, feet, fingers, toes, fingernails, and toenails. All findings within normal limits unless otherwise noted below.   Relevant physical exam findings are noted in the Assessment and Plan.  Exam of nails limited by presence of nail polish.  Right Nasal Sidewall 2 mm crusted papule   Assessment & Plan   SKIN CANCER SCREENING PERFORMED TODAY.  HISTORY OF BASAL CELL CARCINOMA OF THE SKIN - No evidence of recurrence today - Recommend regular full body skin exams - Recommend daily broad spectrum sunscreen SPF 30+ to sun-exposed areas, reapply every 2 hours as needed.  - Call if any new or changing lesions are noted between office visits   ACTINIC DAMAGE - Chronic condition, secondary to cumulative UV/sun exposure - diffuse scaly erythematous macules with underlying dyspigmentation - Recommend daily broad spectrum sunscreen SPF 30+ to sun-exposed areas, reapply every 2 hours as  needed.  - Staying in the shade or wearing long sleeves, sun glasses (UVA+UVB protection) and wide brim hats (4-inch brim around the entire circumference of the hat) are also recommended for sun protection.  - Call for new or changing lesions.  LENTIGINES, SEBORRHEIC KERATOSES, HEMANGIOMAS - Benign normal skin lesions - Benign-appearing - Call for any changes  MELANOCYTIC NEVI - Tan-brown and/or pink-flesh-colored symmetric macules and papules - Benign appearing on exam today - Observation - Call clinic for new or changing moles - Recommend daily use of broad spectrum spf 30+ sunscreen to sun-exposed areas.  - Check nails when remove polish.      NEOPLASM OF SKIN Right Nasal Sidewall Skin / nail biopsy Type of biopsy: tangential   Informed consent: discussed and consent obtained   Timeout: patient name, date of birth, surgical site, and procedure verified   Procedure prep:  Patient was prepped and draped in usual sterile fashion Prep type:  Isopropyl alcohol Anesthesia: the lesion was anesthetized in a standard fashion   Anesthetic:  1% lidocaine w/ epinephrine 1-100,000 buffered w/ 8.4% NaHCO3 Instrument used: DermaBlade   Hemostasis achieved with: pressure and aluminum chloride   Outcome: patient tolerated procedure well   Post-procedure details: sterile dressing applied and wound care instructions given   Dressing type: bandage and petrolatum   Specimen 1 - Surgical pathology Differential Diagnosis: BCC  Check Margins: No Plan Mohs if +skin cancer.  MULTIPLE BENIGN NEVI   LENTIGINES   ACTINIC ELASTOSIS   SEBORRHEIC KERATOSES   CHERRY ANGIOMA   Return in about 1 year (around 06/20/2024) for TBSE, HxBCCs.  I, Lawson Radar, CMA, am acting as scribe for Elie Goody, MD.   Documentation: I have reviewed the above documentation for accuracy and completeness, and I agree with the above.  Elie Goody, MD

## 2023-06-27 LAB — SURGICAL PATHOLOGY

## 2023-06-28 ENCOUNTER — Telehealth: Payer: Self-pay

## 2023-06-28 DIAGNOSIS — C44310 Basal cell carcinoma of skin of unspecified parts of face: Secondary | ICD-10-CM

## 2023-06-28 NOTE — Telephone Encounter (Signed)
 Patient advised pathology showed BCC, referral sent to [facility] per patient preference. Sue Em., RMA

## 2023-06-28 NOTE — Telephone Encounter (Signed)
-----   Message from [place] sent at 06/28/2023 12:23 PM EST ----- Diagnosis: right nasal sidewall :       BASAL CELL CARCINOMA, NODULAR AND INFILTRATIVE PATTERNS    Please call with diagnosis and determine where the patient would like to have Mohs surgery.  Explanation: your biopsy shows a basal cell skin cancer in the second layer of the skin. This is the most common kind of skin cancer and is caused by damage from sun exposure. Basal cell skin cancers almost never spread beyond the skin, so they are not dangerous to your overall health. However, they will continue to grow, can bleed, cause nonhealing wounds, and disrupt nearby structures unless fully treated.  Treatment: Given the location and type of skin cancer, I recommend Mohs surgery. Mohs surgery involves cutting out the skin cancer and then checking under the microscope to ensure the whole skin cancer was removed. If any skin cancer remains, the surgeon will cut out more until it is fully removed. The cure rate is about 98-99%. Once the Mohs surgeon confirms the skin cancer is out, they will discuss the options to repair or heal the area. You must take it easy for about two weeks after surgery (no lifting over 10-15 lbs, avoid activity to get your heart rate and blood pressure up). It is done at another office outside of [place] ([place], [place], or [place]).

## 2023-06-28 NOTE — Telephone Encounter (Signed)
 Patient returned our call to discuss biopsy results, she is aware of the results-BCC but she would like to know why this can't be treated here in the office like she has had treated in the past.

## 2023-07-05 DIAGNOSIS — L814 Other melanin hyperpigmentation: Secondary | ICD-10-CM | POA: Diagnosis not present

## 2023-07-05 DIAGNOSIS — L578 Other skin changes due to chronic exposure to nonionizing radiation: Secondary | ICD-10-CM | POA: Diagnosis not present

## 2023-07-05 DIAGNOSIS — C44311 Basal cell carcinoma of skin of nose: Secondary | ICD-10-CM | POA: Diagnosis not present

## 2023-07-18 DIAGNOSIS — M9901 Segmental and somatic dysfunction of cervical region: Secondary | ICD-10-CM | POA: Diagnosis not present

## 2023-07-18 DIAGNOSIS — M9905 Segmental and somatic dysfunction of pelvic region: Secondary | ICD-10-CM | POA: Diagnosis not present

## 2023-07-18 DIAGNOSIS — M5033 Other cervical disc degeneration, cervicothoracic region: Secondary | ICD-10-CM | POA: Diagnosis not present

## 2023-07-18 DIAGNOSIS — M955 Acquired deformity of pelvis: Secondary | ICD-10-CM | POA: Diagnosis not present

## 2023-08-15 DIAGNOSIS — M9901 Segmental and somatic dysfunction of cervical region: Secondary | ICD-10-CM | POA: Diagnosis not present

## 2023-08-15 DIAGNOSIS — M955 Acquired deformity of pelvis: Secondary | ICD-10-CM | POA: Diagnosis not present

## 2023-08-15 DIAGNOSIS — M9905 Segmental and somatic dysfunction of pelvic region: Secondary | ICD-10-CM | POA: Diagnosis not present

## 2023-08-15 DIAGNOSIS — M5033 Other cervical disc degeneration, cervicothoracic region: Secondary | ICD-10-CM | POA: Diagnosis not present

## 2023-08-31 DIAGNOSIS — I6330 Cerebral infarction due to thrombosis of unspecified cerebral artery: Secondary | ICD-10-CM | POA: Diagnosis not present

## 2023-08-31 DIAGNOSIS — D649 Anemia, unspecified: Secondary | ICD-10-CM | POA: Diagnosis not present

## 2023-08-31 DIAGNOSIS — I10 Essential (primary) hypertension: Secondary | ICD-10-CM | POA: Diagnosis not present

## 2023-08-31 DIAGNOSIS — K2100 Gastro-esophageal reflux disease with esophagitis, without bleeding: Secondary | ICD-10-CM | POA: Diagnosis not present

## 2023-08-31 DIAGNOSIS — R600 Localized edema: Secondary | ICD-10-CM | POA: Diagnosis not present

## 2023-08-31 DIAGNOSIS — E7801 Familial hypercholesterolemia: Secondary | ICD-10-CM | POA: Diagnosis not present

## 2023-08-31 DIAGNOSIS — M810 Age-related osteoporosis without current pathological fracture: Secondary | ICD-10-CM | POA: Diagnosis not present

## 2023-09-04 DIAGNOSIS — Z96653 Presence of artificial knee joint, bilateral: Secondary | ICD-10-CM | POA: Diagnosis not present

## 2023-09-11 DIAGNOSIS — M1990 Unspecified osteoarthritis, unspecified site: Secondary | ICD-10-CM | POA: Diagnosis not present

## 2023-09-11 DIAGNOSIS — I10 Essential (primary) hypertension: Secondary | ICD-10-CM | POA: Diagnosis not present

## 2023-09-11 DIAGNOSIS — Z0000 Encounter for general adult medical examination without abnormal findings: Secondary | ICD-10-CM | POA: Diagnosis not present

## 2023-09-11 DIAGNOSIS — Z1231 Encounter for screening mammogram for malignant neoplasm of breast: Secondary | ICD-10-CM | POA: Diagnosis not present

## 2023-09-11 DIAGNOSIS — E7801 Familial hypercholesterolemia: Secondary | ICD-10-CM | POA: Diagnosis not present

## 2023-09-11 DIAGNOSIS — I6330 Cerebral infarction due to thrombosis of unspecified cerebral artery: Secondary | ICD-10-CM | POA: Diagnosis not present

## 2023-09-11 DIAGNOSIS — M810 Age-related osteoporosis without current pathological fracture: Secondary | ICD-10-CM | POA: Diagnosis not present

## 2023-09-11 DIAGNOSIS — D649 Anemia, unspecified: Secondary | ICD-10-CM | POA: Diagnosis not present

## 2023-09-11 DIAGNOSIS — K2100 Gastro-esophageal reflux disease with esophagitis, without bleeding: Secondary | ICD-10-CM | POA: Diagnosis not present

## 2023-09-12 ENCOUNTER — Other Ambulatory Visit: Payer: Self-pay | Admitting: Infectious Diseases

## 2023-09-12 DIAGNOSIS — Z1231 Encounter for screening mammogram for malignant neoplasm of breast: Secondary | ICD-10-CM

## 2023-09-12 DIAGNOSIS — M9905 Segmental and somatic dysfunction of pelvic region: Secondary | ICD-10-CM | POA: Diagnosis not present

## 2023-09-12 DIAGNOSIS — M9901 Segmental and somatic dysfunction of cervical region: Secondary | ICD-10-CM | POA: Diagnosis not present

## 2023-09-12 DIAGNOSIS — M955 Acquired deformity of pelvis: Secondary | ICD-10-CM | POA: Diagnosis not present

## 2023-09-12 DIAGNOSIS — M5033 Other cervical disc degeneration, cervicothoracic region: Secondary | ICD-10-CM | POA: Diagnosis not present

## 2023-10-03 ENCOUNTER — Encounter

## 2023-10-16 DIAGNOSIS — J069 Acute upper respiratory infection, unspecified: Secondary | ICD-10-CM | POA: Diagnosis not present

## 2023-10-16 DIAGNOSIS — Z03818 Encounter for observation for suspected exposure to other biological agents ruled out: Secondary | ICD-10-CM | POA: Diagnosis not present

## 2023-10-16 DIAGNOSIS — J029 Acute pharyngitis, unspecified: Secondary | ICD-10-CM | POA: Diagnosis not present

## 2023-10-24 ENCOUNTER — Ambulatory Visit
Admission: RE | Admit: 2023-10-24 | Discharge: 2023-10-24 | Disposition: A | Discharge status: Home / Self Care | Source: Ambulatory Visit | Attending: Infectious Diseases | Admitting: Infectious Diseases

## 2023-10-24 DIAGNOSIS — Z1231 Encounter for screening mammogram for malignant neoplasm of breast: Secondary | ICD-10-CM | POA: Insufficient documentation

## 2023-12-03 DIAGNOSIS — H353131 Nonexudative age-related macular degeneration, bilateral, early dry stage: Secondary | ICD-10-CM | POA: Diagnosis not present

## 2023-12-03 DIAGNOSIS — Z947 Corneal transplant status: Secondary | ICD-10-CM | POA: Diagnosis not present

## 2023-12-10 ENCOUNTER — Ambulatory Visit: Admitting: Dermatology

## 2023-12-10 ENCOUNTER — Encounter: Payer: Self-pay | Admitting: Dermatology

## 2023-12-10 DIAGNOSIS — Z85828 Personal history of other malignant neoplasm of skin: Secondary | ICD-10-CM

## 2023-12-10 DIAGNOSIS — B079 Viral wart, unspecified: Secondary | ICD-10-CM

## 2023-12-10 DIAGNOSIS — L820 Inflamed seborrheic keratosis: Secondary | ICD-10-CM

## 2023-12-10 DIAGNOSIS — L853 Xerosis cutis: Secondary | ICD-10-CM

## 2023-12-10 DIAGNOSIS — L821 Other seborrheic keratosis: Secondary | ICD-10-CM

## 2023-12-10 NOTE — Progress Notes (Signed)
   Follow-Up Visit   Subjective  [name] is a 88 y.o. female who presents for the following: spot at L hairline unsure of how long it has been present. Place at R hairline as well and lower neck.    The following portions of the chart were reviewed this encounter and updated as appropriate: medications, allergies, medical history  Review of Systems:  No other skin or systemic complaints except as noted in HPI or Assessment and Plan.  Objective  Well appearing patient in no apparent distress; mood and affect are within normal limits.  A focused examination was performed of the following areas: Scalp, neck   Relevant exam findings are noted in the Assessment and Plan.  L frontotemporal scalp x1, R temple x1 (2) Stuck on waxy paps with erythema R submental x1 Pink papule with finger-like hyperkeratosis  Assessment & Plan   HISTORY OF BASAL CELL CARCINOMA OF THE SKIN S/P Mohs w/ Dr. Gregorio- Right nasal sidewall  - No evidence of recurrence today - Recommend regular full body skin exams. Offered 6 month skin check today. Patient defers until 1 year with Dr MARLA - Recommend daily broad spectrum sunscreen SPF 30+ to sun-exposed areas, reapply every 2 hours as needed.  - Call if any new or changing lesions are noted between office visits  SEBORRHEIC KERATOSIS - Stuck-on, waxy, tan-brown papules and/or plaques  - Benign-appearing - Discussed benign etiology and prognosis. - Observe - Call for any changes  Xerosis - diffuse xerotic patches - recommend gentle, hydrating skin care - gentle skin care handout given -Patient given several samples of moisturizers. Cerave cream, intensive lotion, SA. Eucerin night cream   INFLAMED SEBORRHEIC KERATOSIS (2) L frontotemporal scalp x1, R temple x1 (2) Symptomatic, irritating, patient would like treated. Destruction of lesion - L frontotemporal scalp x1, R temple x1 (2) Complexity: simple   Destruction method: cryotherapy    Informed consent: discussed and consent obtained   Timeout:  patient name, date of birth, surgical site, and procedure verified Lesion destroyed using liquid nitrogen: Yes   Region frozen until ice ball extended beyond lesion: Yes   Cryo cycles: 1 or 2. Outcome: patient tolerated procedure well with no complications   Post-procedure details: wound care instructions given   Additional details:  Prior to procedure, discussed risks of blister formation, small wound, skin dyspigmentation, or rare scar following cryotherapy. Recommend Vaseline ointment to treated areas while healing.   FILIFORM WART R submental x1 Destruction of lesion - R submental x1 Complexity: simple   Destruction method: cryotherapy   Informed consent: discussed and consent obtained   Timeout:  patient name, date of birth, surgical site, and procedure verified Lesion destroyed using liquid nitrogen: Yes   Region frozen until ice ball extended beyond lesion: Yes   Cryo cycles: 1 or 2. Outcome: patient tolerated procedure well with no complications   Post-procedure details: wound care instructions given   Additional details:  Prior to procedure, discussed risks of blister formation, small wound, skin dyspigmentation, or rare scar following cryotherapy. Recommend Vaseline ointment to treated areas while healing.   XEROSIS CUTIS    Return for As scheduled, w/ Dr. Hester, TBSE.  I, Jacquelynn V. Wilfred, CMA, am acting as scribe for Boneta Sharps, MD .   Documentation: I have reviewed the above documentation for accuracy and completeness, and I agree with the above.  Boneta Sharps, MD

## 2023-12-10 NOTE — Patient Instructions (Addendum)

## 2024-01-09 ENCOUNTER — Other Ambulatory Visit: Payer: Self-pay | Admitting: Physician Assistant

## 2024-01-09 ENCOUNTER — Ambulatory Visit
Admission: RE | Admit: 2024-01-09 | Discharge: 2024-01-09 | Disposition: A | Discharge status: Home / Self Care | Source: Ambulatory Visit | Attending: Physician Assistant | Admitting: Physician Assistant

## 2024-01-09 DIAGNOSIS — M7989 Other specified soft tissue disorders: Secondary | ICD-10-CM

## 2024-01-09 DIAGNOSIS — M7121 Synovial cyst of popliteal space [Baker], right knee: Secondary | ICD-10-CM | POA: Diagnosis not present

## 2024-01-09 DIAGNOSIS — I10 Essential (primary) hypertension: Secondary | ICD-10-CM | POA: Diagnosis not present

## 2024-01-15 DIAGNOSIS — M9905 Segmental and somatic dysfunction of pelvic region: Secondary | ICD-10-CM | POA: Diagnosis not present

## 2024-01-15 DIAGNOSIS — M5033 Other cervical disc degeneration, cervicothoracic region: Secondary | ICD-10-CM | POA: Diagnosis not present

## 2024-01-15 DIAGNOSIS — M955 Acquired deformity of pelvis: Secondary | ICD-10-CM | POA: Diagnosis not present

## 2024-01-15 DIAGNOSIS — M9901 Segmental and somatic dysfunction of cervical region: Secondary | ICD-10-CM | POA: Diagnosis not present

## 2024-01-22 DIAGNOSIS — M9901 Segmental and somatic dysfunction of cervical region: Secondary | ICD-10-CM | POA: Diagnosis not present

## 2024-01-22 DIAGNOSIS — M955 Acquired deformity of pelvis: Secondary | ICD-10-CM | POA: Diagnosis not present

## 2024-01-22 DIAGNOSIS — M9905 Segmental and somatic dysfunction of pelvic region: Secondary | ICD-10-CM | POA: Diagnosis not present

## 2024-01-22 DIAGNOSIS — M5033 Other cervical disc degeneration, cervicothoracic region: Secondary | ICD-10-CM | POA: Diagnosis not present

## 2024-01-28 DIAGNOSIS — M955 Acquired deformity of pelvis: Secondary | ICD-10-CM | POA: Diagnosis not present

## 2024-01-28 DIAGNOSIS — M9901 Segmental and somatic dysfunction of cervical region: Secondary | ICD-10-CM | POA: Diagnosis not present

## 2024-01-28 DIAGNOSIS — M9905 Segmental and somatic dysfunction of pelvic region: Secondary | ICD-10-CM | POA: Diagnosis not present

## 2024-01-28 DIAGNOSIS — M5033 Other cervical disc degeneration, cervicothoracic region: Secondary | ICD-10-CM | POA: Diagnosis not present

## 2024-01-31 DIAGNOSIS — M79674 Pain in right toe(s): Secondary | ICD-10-CM | POA: Diagnosis not present

## 2024-01-31 DIAGNOSIS — M79675 Pain in left toe(s): Secondary | ICD-10-CM | POA: Diagnosis not present

## 2024-01-31 DIAGNOSIS — L600 Ingrowing nail: Secondary | ICD-10-CM | POA: Diagnosis not present

## 2024-01-31 DIAGNOSIS — B351 Tinea unguium: Secondary | ICD-10-CM | POA: Diagnosis not present

## 2024-01-31 DIAGNOSIS — M7752 Other enthesopathy of left foot: Secondary | ICD-10-CM | POA: Diagnosis not present

## 2024-02-13 DIAGNOSIS — M9901 Segmental and somatic dysfunction of cervical region: Secondary | ICD-10-CM | POA: Diagnosis not present

## 2024-02-13 DIAGNOSIS — M9905 Segmental and somatic dysfunction of pelvic region: Secondary | ICD-10-CM | POA: Diagnosis not present

## 2024-02-13 DIAGNOSIS — M955 Acquired deformity of pelvis: Secondary | ICD-10-CM | POA: Diagnosis not present

## 2024-02-13 DIAGNOSIS — M5033 Other cervical disc degeneration, cervicothoracic region: Secondary | ICD-10-CM | POA: Diagnosis not present

## 2024-02-27 DIAGNOSIS — M9901 Segmental and somatic dysfunction of cervical region: Secondary | ICD-10-CM | POA: Diagnosis not present

## 2024-02-27 DIAGNOSIS — M955 Acquired deformity of pelvis: Secondary | ICD-10-CM | POA: Diagnosis not present

## 2024-02-27 DIAGNOSIS — M9905 Segmental and somatic dysfunction of pelvic region: Secondary | ICD-10-CM | POA: Diagnosis not present

## 2024-02-27 DIAGNOSIS — M5033 Other cervical disc degeneration, cervicothoracic region: Secondary | ICD-10-CM | POA: Diagnosis not present

## 2024-03-06 DIAGNOSIS — E78019 Familial hypercholesterolemia, unspecified: Secondary | ICD-10-CM | POA: Diagnosis not present

## 2024-03-06 DIAGNOSIS — K2100 Gastro-esophageal reflux disease with esophagitis, without bleeding: Secondary | ICD-10-CM | POA: Diagnosis not present

## 2024-03-06 DIAGNOSIS — M810 Age-related osteoporosis without current pathological fracture: Secondary | ICD-10-CM | POA: Diagnosis not present

## 2024-03-06 DIAGNOSIS — D649 Anemia, unspecified: Secondary | ICD-10-CM | POA: Diagnosis not present

## 2024-03-06 DIAGNOSIS — I6330 Cerebral infarction due to thrombosis of unspecified cerebral artery: Secondary | ICD-10-CM | POA: Diagnosis not present

## 2024-03-06 DIAGNOSIS — I10 Essential (primary) hypertension: Secondary | ICD-10-CM | POA: Diagnosis not present

## 2024-03-06 DIAGNOSIS — Z0000 Encounter for general adult medical examination without abnormal findings: Secondary | ICD-10-CM | POA: Diagnosis not present

## 2024-03-12 DIAGNOSIS — M810 Age-related osteoporosis without current pathological fracture: Secondary | ICD-10-CM | POA: Diagnosis not present

## 2024-03-12 DIAGNOSIS — Z23 Encounter for immunization: Secondary | ICD-10-CM | POA: Diagnosis not present

## 2024-03-12 DIAGNOSIS — D649 Anemia, unspecified: Secondary | ICD-10-CM | POA: Diagnosis not present

## 2024-03-12 DIAGNOSIS — E78019 Familial hypercholesterolemia, unspecified: Secondary | ICD-10-CM | POA: Diagnosis not present

## 2024-03-12 DIAGNOSIS — I639 Cerebral infarction, unspecified: Secondary | ICD-10-CM | POA: Diagnosis not present

## 2024-03-12 DIAGNOSIS — I10 Essential (primary) hypertension: Secondary | ICD-10-CM | POA: Diagnosis not present

## 2024-03-19 DIAGNOSIS — M9901 Segmental and somatic dysfunction of cervical region: Secondary | ICD-10-CM | POA: Diagnosis not present

## 2024-03-19 DIAGNOSIS — M9905 Segmental and somatic dysfunction of pelvic region: Secondary | ICD-10-CM | POA: Diagnosis not present

## 2024-03-19 DIAGNOSIS — M5033 Other cervical disc degeneration, cervicothoracic region: Secondary | ICD-10-CM | POA: Diagnosis not present

## 2024-03-19 DIAGNOSIS — M955 Acquired deformity of pelvis: Secondary | ICD-10-CM | POA: Diagnosis not present

## 2024-04-19 ENCOUNTER — Emergency Department
Admission: EM | Admit: 2024-04-19 | Discharge: 2024-04-19 | Disposition: A | Discharge status: Home / Self Care | Attending: Emergency Medicine | Admitting: Emergency Medicine

## 2024-04-19 ENCOUNTER — Inpatient Hospital Stay (HOSPITAL_COMMUNITY)
Admission: EM | Admit: 2024-04-19 | Discharge: 2024-04-26 | DRG: 493 | Disposition: A | Discharge status: Skilled Nursing Facility | Attending: Internal Medicine | Admitting: Internal Medicine

## 2024-04-19 ENCOUNTER — Other Ambulatory Visit: Payer: Self-pay

## 2024-04-19 ENCOUNTER — Encounter (HOSPITAL_COMMUNITY): Payer: Self-pay

## 2024-04-19 ENCOUNTER — Encounter (HOSPITAL_COMMUNITY): Payer: Self-pay | Admitting: Internal Medicine

## 2024-04-19 ENCOUNTER — Emergency Department

## 2024-04-19 DIAGNOSIS — I493 Ventricular premature depolarization: Secondary | ICD-10-CM

## 2024-04-19 DIAGNOSIS — S82102A Unspecified fracture of upper end of left tibia, initial encounter for closed fracture: Secondary | ICD-10-CM | POA: Diagnosis not present

## 2024-04-19 DIAGNOSIS — E785 Hyperlipidemia, unspecified: Secondary | ICD-10-CM | POA: Diagnosis not present

## 2024-04-19 DIAGNOSIS — Z01818 Encounter for other preprocedural examination: Secondary | ICD-10-CM | POA: Diagnosis not present

## 2024-04-19 DIAGNOSIS — M47814 Spondylosis without myelopathy or radiculopathy, thoracic region: Secondary | ICD-10-CM | POA: Diagnosis not present

## 2024-04-19 DIAGNOSIS — M85862 Other specified disorders of bone density and structure, left lower leg: Secondary | ICD-10-CM | POA: Diagnosis not present

## 2024-04-19 DIAGNOSIS — R008 Other abnormalities of heart beat: Secondary | ICD-10-CM | POA: Diagnosis not present

## 2024-04-19 DIAGNOSIS — Z7982 Long term (current) use of aspirin: Secondary | ICD-10-CM | POA: Diagnosis not present

## 2024-04-19 DIAGNOSIS — Z471 Aftercare following joint replacement surgery: Secondary | ICD-10-CM | POA: Diagnosis not present

## 2024-04-19 DIAGNOSIS — M25562 Pain in left knee: Secondary | ICD-10-CM | POA: Diagnosis not present

## 2024-04-19 DIAGNOSIS — S20221A Contusion of right back wall of thorax, initial encounter: Secondary | ICD-10-CM

## 2024-04-19 DIAGNOSIS — Z96612 Presence of left artificial shoulder joint: Secondary | ICD-10-CM | POA: Diagnosis not present

## 2024-04-19 DIAGNOSIS — M25462 Effusion, left knee: Secondary | ICD-10-CM | POA: Diagnosis not present

## 2024-04-19 DIAGNOSIS — S300XXA Contusion of lower back and pelvis, initial encounter: Secondary | ICD-10-CM | POA: Diagnosis not present

## 2024-04-19 DIAGNOSIS — S76112A Strain of left quadriceps muscle, fascia and tendon, initial encounter: Secondary | ICD-10-CM | POA: Diagnosis not present

## 2024-04-19 DIAGNOSIS — Y92009 Unspecified place in unspecified non-institutional (private) residence as the place of occurrence of the external cause: Secondary | ICD-10-CM | POA: Diagnosis not present

## 2024-04-19 DIAGNOSIS — D72829 Elevated white blood cell count, unspecified: Secondary | ICD-10-CM | POA: Insufficient documentation

## 2024-04-19 DIAGNOSIS — M25862 Other specified joint disorders, left knee: Secondary | ICD-10-CM | POA: Diagnosis not present

## 2024-04-19 DIAGNOSIS — R55 Syncope and collapse: Secondary | ICD-10-CM | POA: Diagnosis not present

## 2024-04-19 DIAGNOSIS — Z981 Arthrodesis status: Secondary | ICD-10-CM | POA: Diagnosis not present

## 2024-04-19 DIAGNOSIS — R42 Dizziness and giddiness: Secondary | ICD-10-CM | POA: Diagnosis not present

## 2024-04-19 DIAGNOSIS — M9712XA Periprosthetic fracture around internal prosthetic left knee joint, initial encounter: Secondary | ICD-10-CM | POA: Diagnosis not present

## 2024-04-19 DIAGNOSIS — M160 Bilateral primary osteoarthritis of hip: Secondary | ICD-10-CM | POA: Diagnosis not present

## 2024-04-19 DIAGNOSIS — I700 Atherosclerosis of aorta: Secondary | ICD-10-CM | POA: Diagnosis not present

## 2024-04-19 DIAGNOSIS — S82152A Displaced fracture of left tibial tuberosity, initial encounter for closed fracture: Secondary | ICD-10-CM | POA: Diagnosis not present

## 2024-04-19 DIAGNOSIS — W08XXXA Fall from other furniture, initial encounter: Secondary | ICD-10-CM | POA: Diagnosis not present

## 2024-04-19 DIAGNOSIS — Y9339 Activity, other involving climbing, rappelling and jumping off: Secondary | ICD-10-CM | POA: Diagnosis not present

## 2024-04-19 DIAGNOSIS — S82142A Displaced bicondylar fracture of left tibia, initial encounter for closed fracture: Secondary | ICD-10-CM | POA: Diagnosis not present

## 2024-04-19 DIAGNOSIS — I959 Hypotension, unspecified: Secondary | ICD-10-CM | POA: Diagnosis not present

## 2024-04-19 DIAGNOSIS — W19XXXA Unspecified fall, initial encounter: Principal | ICD-10-CM

## 2024-04-19 DIAGNOSIS — R609 Edema, unspecified: Secondary | ICD-10-CM | POA: Diagnosis not present

## 2024-04-19 DIAGNOSIS — Z7401 Bed confinement status: Secondary | ICD-10-CM | POA: Diagnosis not present

## 2024-04-19 DIAGNOSIS — I10 Essential (primary) hypertension: Secondary | ICD-10-CM

## 2024-04-19 DIAGNOSIS — W07XXXA Fall from chair, initial encounter: Secondary | ICD-10-CM | POA: Insufficient documentation

## 2024-04-19 DIAGNOSIS — M2580 Other specified joint disorders, unspecified joint: Secondary | ICD-10-CM | POA: Diagnosis not present

## 2024-04-19 DIAGNOSIS — M25552 Pain in left hip: Secondary | ICD-10-CM | POA: Diagnosis not present

## 2024-04-19 DIAGNOSIS — Z96652 Presence of left artificial knee joint: Secondary | ICD-10-CM | POA: Diagnosis not present

## 2024-04-19 DIAGNOSIS — G8918 Other acute postprocedural pain: Secondary | ICD-10-CM | POA: Diagnosis not present

## 2024-04-19 DIAGNOSIS — S3992XA Unspecified injury of lower back, initial encounter: Secondary | ICD-10-CM | POA: Diagnosis present

## 2024-04-19 DIAGNOSIS — I951 Orthostatic hypotension: Secondary | ICD-10-CM

## 2024-04-19 DIAGNOSIS — M549 Dorsalgia, unspecified: Secondary | ICD-10-CM | POA: Diagnosis not present

## 2024-04-19 DIAGNOSIS — M546 Pain in thoracic spine: Secondary | ICD-10-CM | POA: Diagnosis not present

## 2024-04-19 LAB — RETICULOCYTES
Immature Retic Fract: 6.7 % (ref 2.3–15.9)
RBC.: 2.37 MIL/uL — ABNORMAL LOW (ref 3.87–5.11)
Retic Count, Absolute: 25.8 K/uL (ref 19.0–186.0)
Retic Ct Pct: 1.1 % (ref 0.4–3.1)

## 2024-04-19 LAB — COMPREHENSIVE METABOLIC PANEL WITH GFR
ALT: 23 U/L (ref 0–44)
ALT: 20 U/L (ref 0–44)
AST: 37 U/L (ref 15–41)
AST: 30 U/L (ref 15–41)
Albumin: 4 g/dL (ref 3.5–5.0)
Albumin: 3 g/dL — ABNORMAL LOW (ref 3.5–5.0)
Alkaline Phosphatase: 59 U/L (ref 38–126)
Alkaline Phosphatase: 37 U/L — ABNORMAL LOW (ref 38–126)
Anion gap: 12 (ref 5–15)
Anion gap: 12 (ref 5–15)
BUN: 17 mg/dL (ref 8–23)
BUN: 17 mg/dL (ref 8–23)
CO2: 21 mmol/L — ABNORMAL LOW (ref 22–32)
CO2: 20 mmol/L — ABNORMAL LOW (ref 22–32)
Calcium: 9.2 mg/dL (ref 8.9–10.3)
Calcium: 8.4 mg/dL — ABNORMAL LOW (ref 8.9–10.3)
Chloride: 103 mmol/L (ref 98–111)
Chloride: 105 mmol/L (ref 98–111)
Creatinine, Ser: 0.92 mg/dL (ref 0.44–1.00)
Creatinine, Ser: 0.89 mg/dL (ref 0.44–1.00)
GFR, Estimated: >60 mL/min (ref >60)
GFR, Estimated: >60 mL/min (ref >60)
Glucose, Bld: 131 mg/dL — ABNORMAL HIGH (ref 70–99)
Glucose, Bld: 94 mg/dL (ref 70–99)
Potassium: 4 mmol/L (ref 3.5–5.1)
Potassium: 4.6 mmol/L (ref 3.5–5.1)
Sodium: 136 mmol/L (ref 135–145)
Sodium: 137 mmol/L (ref 135–145)
Total Bilirubin: 0.4 mg/dL (ref 0.0–1.2)
Total Bilirubin: 0.6 mg/dL (ref 0.0–1.2)
Total Protein: 6.7 g/dL (ref 6.5–8.1)
Total Protein: 5.2 g/dL — ABNORMAL LOW (ref 6.5–8.1)

## 2024-04-19 LAB — CBC
HCT: 34.5 % — ABNORMAL LOW (ref 36.0–46.0)
Hemoglobin: 10.9 g/dL — ABNORMAL LOW (ref 12.0–15.0)
MCH: 30.9 pg (ref 26.0–34.0)
MCHC: 31.6 g/dL (ref 30.0–36.0)
MCV: 97.7 fL (ref 80.0–100.0)
Platelets: 193 K/uL (ref 150–400)
RBC: 3.53 MIL/uL — ABNORMAL LOW (ref 3.87–5.11)
RDW: 14 % (ref 11.5–15.5)
WBC: 15.1 K/uL — ABNORMAL HIGH (ref 4.0–10.5)
nRBC: 0 % (ref 0.0–0.2)

## 2024-04-19 LAB — URINALYSIS, ROUTINE W REFLEX MICROSCOPIC
Bilirubin Urine: NEGATIVE
Glucose, UA: NEGATIVE mg/dL
Hgb urine dipstick: NEGATIVE
Ketones, ur: NEGATIVE mg/dL
Leukocytes,Ua: NEGATIVE
Nitrite: NEGATIVE
Protein, ur: NEGATIVE mg/dL
Specific Gravity, Urine: 1.014 (ref 1.005–1.030)
pH: 5 (ref 5.0–8.0)

## 2024-04-19 LAB — PROTIME-INR
INR: 1.1 (ref 0.8–1.2)
Prothrombin Time: 14.3 s (ref 11.4–15.2)

## 2024-04-19 LAB — TYPE AND SCREEN
ABO/RH(D): A POS
Antibody Screen: NEGATIVE

## 2024-04-19 LAB — IRON AND TIBC
Iron: 28 ug/dL (ref 28–170)
Saturation Ratios: 12 % (ref 10.4–31.8)
TIBC: 228 ug/dL — ABNORMAL LOW (ref 250–450)
UIBC: 200 ug/dL

## 2024-04-19 LAB — FOLATE: Folate: >20 ng/mL (ref >5.9)

## 2024-04-19 LAB — VITAMIN B12: Vitamin B-12: 1399 pg/mL — ABNORMAL HIGH (ref 180–914)

## 2024-04-19 LAB — TSH: TSH: 1.802 u[IU]/mL (ref 0.350–4.500)

## 2024-04-19 LAB — FERRITIN: Ferritin: 113 ng/mL (ref 11–307)

## 2024-04-19 LAB — T4, FREE: Free T4: 0.85 ng/dL (ref 0.61–1.12)

## 2024-04-19 MED ORDER — MORPHINE SULFATE (PF) 2 MG/ML IV SOLN
2.0000 mg | Freq: Once | INTRAVENOUS | Status: AC
  Administered 2024-04-19: 2 mg via INTRAVENOUS
  Filled 2024-04-19: qty 1

## 2024-04-19 MED ORDER — PANTOPRAZOLE SODIUM 40 MG IV SOLR
40.0000 mg | Freq: Two times a day (BID) | INTRAVENOUS | Status: DC
  Administered 2024-04-19 – 2024-04-22 (×7): 40 mg via INTRAVENOUS
  Filled 2024-04-19 (×8): qty 10

## 2024-04-19 MED ORDER — ALPRAZOLAM 0.25 MG PO TABS
0.2500 mg | ORAL_TABLET | Freq: Three times a day (TID) | ORAL | Status: DC | PRN
  Administered 2024-04-20: 0.25 mg via ORAL
  Filled 2024-04-19: qty 1

## 2024-04-19 MED ORDER — ATORVASTATIN CALCIUM 40 MG PO TABS
40.0000 mg | ORAL_TABLET | Freq: Every day | ORAL | Status: DC
  Administered 2024-04-19 – 2024-04-26 (×7): 40 mg via ORAL
  Filled 2024-04-19 (×7): qty 1

## 2024-04-19 MED ORDER — MORPHINE SULFATE (PF) 4 MG/ML IV SOLN
4.0000 mg | Freq: Once | INTRAVENOUS | Status: AC
  Administered 2024-04-19: 4 mg via INTRAVENOUS
  Filled 2024-04-19: qty 1

## 2024-04-19 MED ORDER — BISACODYL 5 MG PO TBEC: 5.0000 mg | DELAYED_RELEASE_TABLET | Freq: Every day | ORAL | Status: DC | PRN

## 2024-04-19 MED ORDER — HEPARIN SODIUM (PORCINE) 5000 UNIT/ML IJ SOLN
5000.0000 [IU] | Freq: Two times a day (BID) | INTRAMUSCULAR | Status: DC
  Administered 2024-04-19: 5000 [IU] via SUBCUTANEOUS
  Filled 2024-04-19: qty 1

## 2024-04-19 MED ORDER — CHLORHEXIDINE GLUCONATE CLOTH 2 % EX PADS
6.0000 | MEDICATED_PAD | Freq: Every day | CUTANEOUS | Status: DC
  Administered 2024-04-19 – 2024-04-21 (×3): 6 via TOPICAL

## 2024-04-19 MED ORDER — POLYETHYLENE GLYCOL 3350 17 G PO PACK: 17.0000 g | PACK | Freq: Every day | ORAL | Status: DC | PRN

## 2024-04-19 MED ORDER — ONDANSETRON HCL 4 MG/2ML IJ SOLN
4.0000 mg | INTRAMUSCULAR | Status: AC
  Administered 2024-04-19: 4 mg via INTRAVENOUS
  Filled 2024-04-19: qty 2

## 2024-04-19 MED ORDER — SODIUM CHLORIDE 0.9 % IV SOLN: Freq: Once | INTRAVENOUS | Status: AC

## 2024-04-19 MED ORDER — MORPHINE SULFATE (PF) 2 MG/ML IV SOLN
2.0000 mg | INTRAVENOUS | Status: DC | PRN
  Administered 2024-04-20 (×3): 2 mg via INTRAVENOUS
  Filled 2024-04-19 (×4): qty 1

## 2024-04-19 MED ORDER — KETOROLAC TROMETHAMINE 30 MG/ML IJ SOLN
15.0000 mg | Freq: Once | INTRAMUSCULAR | Status: AC
  Administered 2024-04-19: 15 mg via INTRAVENOUS
  Filled 2024-04-19: qty 1

## 2024-04-19 MED ORDER — LACTATED RINGERS IV BOLUS
1000.0000 mL | Freq: Once | INTRAVENOUS | Status: AC
  Administered 2024-04-19: 1000 mL via INTRAVENOUS

## 2024-04-19 NOTE — ED Provider Notes (Signed)
-----------------------------------------   11:42 AM on 04/19/2024 ----------------------------------------- Patient is retaining around 500 cc of urine.  Given the patient's fracture need for surgical fixation and difficulty in movement or getting to a restroom we will place a Foley catheter to decompress the bladder   Dorothyann Drivers, MD 04/19/24 1143

## 2024-04-19 NOTE — Progress Notes (Signed)
 Orthopedic Tech Progress Note Patient Details:  [name] 1936/03/05 [id number]  Ortho Devices Type of Ortho Device: Knee Immobilizer Ortho Device/Splint Location: LLE Ortho Device/Splint Interventions: Application   Post Interventions Patient Tolerated: Well  Cung Masterson E Jaimy Kliethermes 04/19/2024, 3:10 PM

## 2024-04-19 NOTE — ED Notes (Signed)
 Pt c/o not being able to urinate. Bladder scanned at this time. Bladder scan showing . MD made aware.

## 2024-04-19 NOTE — ED Notes (Signed)
 Report given to [employer], receiving nurse on [address].

## 2024-04-19 NOTE — ED Notes (Signed)
 Urine sent at this time.

## 2024-04-19 NOTE — Progress Notes (Signed)
 Notified from the [facility] Emergency Department regarding injury to patient. Imaging of the left knee was reviewed which reveals a periprosthetic fracture around tibial component of total knee arthroplasty. I spoke with the Emergency Department physician and due to the complexity of injury, I recommend transfer to a higher level facility for definitive orthopedic care.

## 2024-04-19 NOTE — Plan of Care (Signed)

## 2024-04-19 NOTE — ED Notes (Signed)
 Pt to Xray

## 2024-04-19 NOTE — Progress Notes (Signed)
 Aware of patient transfer. Patient to be non weight bearing. Place knee immobilizer. Dr Dozier will be making arrangements for total knee specialist to take over care of patient so no surgery likely until Monday at the earliest. Formal consult to follow.   [employer], PA-C

## 2024-04-19 NOTE — ED Provider Notes (Signed)
 [facility] Provider Note    Event Date/Time   First MD Initiated Contact with Patient 04/19/24 0028     (approximate)   History   Fall and Knee Injury   HPI [name] is a 88 y.o. female who presents by EMS for evaluation after a fall.  She somewhat sheepishly admits that she climbed onto a stepladder and when she was at the top she realize she should not have but it was too late and she fell off backwards.  She is not sure exactly on what she landed, but the middle of her back hurts, mostly on the right side, and her left knee is extremely swollen and painful with any attempted movement.  She does not specifically have pain in her hips but she cannot tell what all hurts.  She has no headache or neck pain.  She did not lose consciousness.  She denies chest pain and shortness of breath and abdominal pain.  Patient states she takes a daily aspirin  but no other blood thinners     Physical Exam   Triage Vital Signs: ED Triage Vitals  Encounter Vitals Group     BP 04/19/24 0032 (!) 116/50     Girls Systolic BP Percentile --      Girls Diastolic BP Percentile --      Boys Systolic BP Percentile --      Boys Diastolic BP Percentile --      Pulse Rate 04/19/24 0033 62     Resp 04/19/24 0034 14     Temp 04/19/24 0035 97.7 F (36.5 C)     Temp Source 04/19/24 0035 Oral     SpO2 04/19/24 0030 95 %     Weight 04/19/24 0033 86.5 kg (190 lb 11.2 oz)     Height 04/19/24 0033 1.753 m (5' 9)     Head Circumference --      Peak Flow --      Pain Score 04/19/24 0144 10     Pain Loc --      Pain Education --      Exclude from Growth Chart --     Most recent vital signs: Vitals:   04/19/24 0500 04/19/24 0530  BP: (!) 122/53 (!) 114/48  Pulse: 61 61  Resp: 18 17  Temp:    SpO2: 99% 99%    General: Awake, alert, pleasant and conversant, appears uncomfortable but not critically injured.  She has no evidence of facial or head contusion, no visible or  palpable deformity, and no pain or tenderness with palpation of the cervical spine nor with flexion, extension, or rotation of the head and neck from side-to-side. CV:  Good peripheral perfusion.  Resp:  Normal effort. Speaking easily and comfortably, no accessory muscle usage nor intercostal retractions.   Abd:  No distention.  No tenderness to palpation of the abdomen. Other:  Patient has a substantially swollen left distal thigh, knee, and left lower leg.  There is no obvious bruising yet.  The patient states that this is much larger than baseline.  Compartments are soft and easily compressible.  Pain with any amount of movement or manipulation of the leg.  The patient's leg is flexed at the knee and externally rotated but it is difficult to appreciate if this is due to the size and amount of swelling or if this is due to a more proximal injury such as of the hip or pelvis.  She also has visible contusions that  are tender to palpation to the right of her mid thoracic spine.  No palpable step-offs nor deformities of the spine itself with no specific bony tenderness to palpation.   ED Results / Procedures / Treatments   Labs (all labs ordered are listed, but only abnormal results are displayed) Labs Reviewed  COMPREHENSIVE METABOLIC PANEL WITH GFR - Abnormal; Notable for the following components:      Result Value   CO2 21 (*)    Glucose, Bld 131 (*)    All other components within normal limits  CBC - Abnormal; Notable for the following components:   WBC 15.1 (*)    RBC 3.53 (*)    Hemoglobin 10.9 (*)    HCT 34.5 (*)    All other components within normal limits  PROTIME-INR  URINALYSIS, ROUTINE W REFLEX MICROSCOPIC  CBG MONITORING, ED  TYPE AND SCREEN     EKG  ED ECG REPORT I, Darleene Dome, the attending physician, personally viewed and interpreted this ECG.  Date: 04/19/2024 EKG Time: 00: 35 Rate: 66 Rhythm: Junctional rhythm versus sinus with difficult to appreciate P  waves QRS Axis: normal Intervals: Right bundle branch block and left anterior fascicular block with LVH ST/T Wave abnormalities: Non-specific ST segment / T-wave changes, but no clear evidence of acute ischemia. Narrative Interpretation: no definitive evidence of acute ischemia; does not meet STEMI criteria.    RADIOLOGY See ED course for details   PROCEDURES:  Critical Care performed: No  Procedures    IMPRESSION / MDM / ASSESSMENT AND PLAN / ED COURSE  I reviewed the triage vital signs and the nursing notes.                              Differential diagnosis includes, but is not limited to, fracture, dislocation, hematoma/effusion, spinal injury.  Patient's presentation is most consistent with acute presentation with potential threat to life or bodily function.  Labs/studies ordered: CMP, CBC, pro time-INR, type and screen, EKG, and x-rays of the chest, left knee, left hip and pelvis, and thoracic spine  Interventions/Medications given:  Medications  morphine (PF) 2 MG/ML injection 2 mg (2 mg Intravenous Given 04/19/24 0144)  ondansetron  (ZOFRAN ) injection 4 mg (4 mg Intravenous Given 04/19/24 0144)  lactated ringers bolus 1,000 mL (0 mLs Intravenous Stopped 04/19/24 0504)  ketorolac (TORADOL) 30 MG/ML injection 15 mg (15 mg Intravenous Given 04/19/24 0237)  0.9 %  sodium chloride  infusion ( Intravenous New Bag/Given 04/19/24 0546)    (Note:  hospital course my include additional interventions and/or labs/studies not listed above.)  Patient does not appear to have sustained any injury to her head or neck and she does not take blood thinners which is reassuring.  However she has a grossly abnormal appearing left leg primarily around the knee.  She reports that she has bilateral knee replacements, and either new fracture or disruption of the existing prosthesis is possible.  Less likely is an injury to her thoracic spine but she has a contusion so I will evaluate with  x-rays.  While we are obtaining x-rays I am also getting x-rays of the hip and pelvis as well as a chest x-ray should she require surgery and we can rule out acute injuries as well as plan for surgery if it becomes necessary.  I am giving morphine 2 mg IV because her blood pressure is little bit on the low side and because of her advanced age  and we will add additional analgesia if necessary.   Clinical Course as of 04/19/24 0645  Sat Apr 19, 2024  0154 CMP is essentially normal.  Patient has a mild leukocytosis of unclear etiology or clinical significance. [CF]  0156 DG Hip Unilat W or Wo Pelvis 2-3 Views Left I independently viewed and interpreted the patient's hip and pelvis x-rays I see no evidence of fracture or other deformity.  Confirmed by radiologist. [CF]  9843 DG Knee Complete 4 Views Left I independently viewed and interpreted the patient's knee x-rays and unfortunately she has an anterior proximal tibia fracture near the existing prosthesis.  This is confirmed by radiology and noted to be displaced. [CF]  0157 DG Thoracic Spine 2 View I also independently viewed and interpreted the patient's thoracic spine x-rays and I see no evidence of fracture.  Confirmed by radiology. [CF]  0157 DG Chest Port 1 View I independently viewed and interpreted the patient's chest x-ray(s), and I also reviewed the radiologist's report.  No acute abnormalities including no fracture or evidence of pneumonia. [CF]  0209 I consulted by phone with Dr. Arlyss Schneider with orthopedic surgery.  He reviewed the imaging and advises that given the complexity of the injury, particularly the periprosthetic nature of it, the patient would be best served by transfer to a larger tertiary care facility.  I talked with the patient about this and her preference is to be transferred to [facility] because she has had prior orthopedic surgeries there, and if they cannot accept her, she is comfortable going to [facility].  She  is adamant that her insurance will not cover services at [facility].  I will reach out to [facility] now. [CF]  0228 Patient has been hypotensive since getting the morphine.  She is still reporting 10 out of 10 pain.  I am ordering a liter of LR to bolster her blood pressure and I have ordered Toradol 15 mg IV for pain; it is not necessarily my preference, but I cannot give her any additional opiates and she is still hurting quite a bit. [CF]  0256 DG Chest Port 1 View I independently viewed and interpreted the patient's chest x-ray(s), and I also reviewed the radiologist's report.  No acute findings. [CF]  [id number] I spoke with the [facility] logistic center again.  She says she spoke with Dr. Laurence, the orthopedic surgeon on-call, and he reviewed the imaging.  However, he stated he would need to speak with the joint specialist before excepting the patient.  He said that he will not call the joint specialist until later this morning.  The current plan is for them to call at some point later this morning, possibly during the day shift.  I have asked my diplomatic services operational officer to call [facility] and see if we can facilitate a transfer to [facility] in the meantime [CF]  0349 I consulted by phone with Dr. Dozier with [facility].  We discussed the case and he said that yes, we should send the patient to them, and recommended I speak with the hospitalist team to get the patient admitted and transferred.  I am now awaiting a callback from the hospitalist.  He requested that we obtain a CT scan of the knee while we are awaiting transfer. [CF]  [phone] Spoke with Dr. Alfornia, on-call hospitalist at [facility].  We discussed the case in detail and she accepted the patient.  We are now awaiting bed assignment [CF]  [phone] Patient is still awaiting  bed assignment at [facility].  Transferring ED care to oncoming ED physician to manage her care until she can be transferred. [CF]    Clinical Course User Index [CF] Gordan Huxley, MD     FINAL  CLINICAL IMPRESSION(S) / ED DIAGNOSES   Final diagnoses:  Fall, initial encounter  Periprosthetic fracture around internal prosthetic left knee joint, initial encounter  Knee effusion, left  Back contusion, right, initial encounter  Leukocytosis, unspecified type     Rx / DC Orders   ED Discharge Orders     None        Note:  This document was prepared using Dragon voice recognition software and may include unintentional dictation errors.   Gordan Huxley, MD 04/19/24 [phone]

## 2024-04-19 NOTE — ED Notes (Signed)
 Report given to [facility].

## 2024-04-19 NOTE — ED Notes (Signed)
 EMTALA reviewed by this RN, transfer consent also verified at this time.

## 2024-04-19 NOTE — H&P (Signed)
 History and Physical    Patient: [name] FMW:[id number] DOB: 12-12-1935 DOA: 04/19/2024 DOS: the patient was seen and examined on 04/19/2024 . PCP: Epifanio Alm SQUIBB, MD  Patient coming from: [facility] ED Chief complaint: Knee pain.  HPI:  [name] is a 88 y.o. female with past medical history  of  essential HTN, osteoporosis coming for fall and left knee surgery.   Patient was brought last night for a fall when she was climbing a stepstool and missed her step with no trauma reported to her head however per chart review and RN note EMS noted a syncopal episode when they were with her for about a minute after which patient returned back to her baseline.  Patient fell on her left knee which she has a history of surgery on in the past. Patient reports swelling and pain inability to move or bear weight.  Initial blood pressure was 116/50. Case was discussed by EDMD at [facility] with Dr. Gust orthopedics at [facility] who recommended patient to a higher level facility for definitive orthopedic care.   ED Course:  Vital signs in the ED were notable for the following:  Vitals:   04/19/24 1300 04/19/24 1500 04/19/24 1536  BP: (!) 124/48 (!) 130/54 (!) 130/54  Pulse: 71 79 79  Temp: 98.4 F (36.9 C) 98.1 F (36.7 C) 98.1 F (36.7 C)  Resp: 18  18  Height:   5' 9 (1.753 m)  Weight:   86.5 kg  SpO2: 97% 98%   TempSrc: Oral Oral Oral  BMI (Calculated):   28.15   >>ED evaluation thus far shows: -X-ray imaging of the left knee showed a periprosthetic fracture around the tibial component of total knee arthroplasty. - CMP showed glucose 131 bicarb of 21 normal kidney function normal LFTs. -CBC showing anemia with a hemoglobin of 10.9 white count of 15.1 platelets of 193. -EKG shows a junctional rhythm at 66 with a right bundle branch block LVH and QTc of 442.  >>While in the ED patient received the following: 1L LR.  Review of Systems  Musculoskeletal:  Positive for falls  and joint pain.   Past Medical History:  Diagnosis Date   Arthritis    BCC (basal cell carcinoma of skin) 06/21/2023   right nasal sidewall, refer for Mohs   GERD (gastroesophageal reflux disease)    Hx of basal cell carcinoma 01/13/2013   left parasternal chest   Hx of basal cell carcinoma 04/26/2015   right sacral area   Hx of basal cell carcinoma 04/26/2016   left temple   Hypertension    Past Surgical History:  Procedure Laterality Date   ABDOMINAL HYSTERECTOMY     APPENDECTOMY     BACK SURGERY     COLONOSCOPY WITH PROPOFOL  N/A 03/07/2017   Procedure: COLONOSCOPY WITH PROPOFOL ;  Surgeon: Toledo, Ladell POUR, MD;  Location: [facility] ENDOSCOPY;  Service: Gastroenterology;  Laterality: N/A;   ESOPHAGOGASTRODUODENOSCOPY N/A 03/07/2017   Procedure: ESOPHAGOGASTRODUODENOSCOPY (EGD);  Surgeon: Toledo, Ladell POUR, MD;  Location: [facility] ENDOSCOPY;  Service: Gastroenterology;  Laterality: N/A;   FOOT SURGERY Left 05/2016   JOINT REPLACEMENT Bilateral 2010 2011   REPLACEMENT TOTAL KNEE BILATERAL  2010    reports that she has never smoked. She does not have any smokeless tobacco history on file. She reports that she does not drink alcohol. No history on file for drug use. Allergies  Allergen Reactions   Tramadol-Acetaminophen  Nausea Only    Other reaction(s): Other (See Comments) AMS  Family History  Problem Relation Age of Onset   Breast cancer Maternal Grandmother    Prior to Admission medications   Medication Sig Start Date End Date Taking? Authorizing Provider  alendronate (FOSAMAX) 70 MG tablet Take by mouth. 09/19/22   [provider]  ALPRAZolam (XANAX) 0.25 MG tablet Take 1 tablet by mouth 3 (three) times daily. 09/03/15   [provider]  aspirin  325 MG EC tablet Take 1 tablet (325 mg total) by mouth daily. 11/29/15   Shah, Vipul, MD  atorvastatin (LIPITOR) 40 MG tablet Take by mouth. 04/28/21   [provider]  clobetasol  (TEMOVATE ) 0.05 % external  solution Apply 1 application topically as directed. apply to scalp, leave on 15 - 30 minutes, then shampoo out. 06/09/21   Hester Alm BROCKS, MD  Coenzyme Q10 (CO Q 10) 100 MG CAPS Take 100 mg by mouth daily.    [provider]  esomeprazole (NEXIUM) 40 MG capsule Take 1 capsule by mouth 2 (two) times daily. 11/17/15   [provider]  furosemide (LASIX) 20 MG tablet TAKE 1 TABLET BY MOUTH DAILY AS NEEDED FOR EDEMA 03/17/22   [provider]  hydrocortisone  2.5 % cream Apply topically every morning. 5 days per week as needed for active rash 06/15/22   Hester Alm BROCKS, MD  ketoconazole  (NIZORAL ) 2 % cream Apply 1 Application topically every evening. 06/15/22 06/04/24  Hester Alm BROCKS, MD  ketoconazole  (NIZORAL ) 2 % shampoo apply once  per week, massage into scalp and leave in for 10 minutes before rinsing out 06/09/21   Hester Alm BROCKS, MD  losartan (COZAAR) 25 MG tablet Take 25 mg by mouth daily.    [provider]  Multiple Vitamins-Minerals (MULTIVITAMIN WITH MINERALS) tablet Take 1 tablet by mouth daily.    [provider]  prednisoLONE acetate (PRED FORTE) 1 % ophthalmic suspension Apply to eye. 03/22/18   [provider]  simvastatin  (ZOCOR ) 40 MG tablet Take 1 tablet (40 mg total) by mouth daily at 6 PM. 11/29/15   Maree Hue, MD  vitamin A 8000 UNIT capsule Take 8,000 Units by mouth daily.    [provider]  vitamin E 400 UNIT capsule Take 400 Units by mouth daily.    [provider]                                                                                 Vitals:   04/19/24 1300 04/19/24 1500 04/19/24 1536  BP: (!) 124/48 (!) 130/54 (!) 130/54  Pulse: 71 79 79  Resp: 18  18  Temp: 98.4 F (36.9 C) 98.1 F (36.7 C) 98.1 F (36.7 C)  TempSrc: Oral Oral Oral  SpO2: 97% 98%   Weight:   86.5 kg  Height:   5' 9 (1.753 m)   Physical Exam Vitals reviewed.  Constitutional:      General: She is not in acute  distress.    Appearance: She is not ill-appearing.  HENT:     Head: Normocephalic.  Eyes:     Extraocular Movements: Extraocular movements intact.  Cardiovascular:     Rate and Rhythm: Normal rate and regular rhythm.  Heart sounds: Normal heart sounds.  Pulmonary:     Breath sounds: Normal breath sounds.  Abdominal:     General: There is no distension.     Palpations: Abdomen is soft.     Tenderness: There is no abdominal tenderness.  Neurological:     General: No focal deficit present.     Mental Status: She is alert and oriented to person, place, and time.     Labs on Admission: I have personally reviewed following labs and imaging studies CBC: Recent Labs  Lab 04/19/24 0105  WBC 15.1*  HGB 10.9*  HCT 34.5*  MCV 97.7  PLT 193   Basic Metabolic Panel: Recent Labs  Lab 04/19/24 0105  NA 136  K 4.0  CL 103  CO2 21*  GLUCOSE 131*  BUN 17  CREATININE 0.92  CALCIUM 9.2   GFR: Estimated Creatinine Clearance: 51.5 mL/min (by C-G formula based on SCr of 0.92 mg/dL). Liver Function Tests: Recent Labs  Lab 04/19/24 0105  AST 37  ALT 23  ALKPHOS 59  BILITOT 0.4  PROT 6.7  ALBUMIN 4.0   No results for input(s): LIPASE, AMYLASE in the last 168 hours. No results for input(s): AMMONIA in the last 168 hours. Recent Labs    04/19/24 0105  BUN 17  CREATININE 0.92    Cardiac Enzymes: No results for input(s): CKTOTAL, CKMB, CKMBINDEX, TROPONINI in the last 168 hours. BNP (last 3 results) No results for input(s): PROBNP in the last 8760 hours. HbA1C: No results for input(s): HGBA1C in the last 72 hours. CBG: No results for input(s): GLUCAP in the last 168 hours. Lipid Profile: No results for input(s): CHOL, HDL, LDLCALC, TRIG, CHOLHDL, LDLDIRECT in the last 72 hours. Thyroid  Function Tests: No results for input(s): TSH, T4TOTAL, FREET4, T3FREE, THYROIDAB in the last 72 hours. Anemia Panel: No results for input(s):  VITAMINB12, FOLATE, FERRITIN, TIBC, IRON, RETICCTPCT in the last 72 hours. Urine analysis:    Component Value Date/Time   COLORURINE YELLOW (A) 04/19/2024 1156   APPEARANCEUR CLEAR (A) 04/19/2024 1156   LABSPEC 1.014 04/19/2024 1156   PHURINE 5.0 04/19/2024 1156   GLUCOSEU NEGATIVE 04/19/2024 1156   HGBUR NEGATIVE 04/19/2024 1156   BILIRUBINUR NEGATIVE 04/19/2024 1156   KETONESUR NEGATIVE 04/19/2024 1156   PROTEINUR NEGATIVE 04/19/2024 1156   NITRITE NEGATIVE 04/19/2024 1156   LEUKOCYTESUR NEGATIVE 04/19/2024 1156   Radiological Exams on Admission: CT Knee Left Wo Contrast Result Date: 04/19/2024 EXAM: CT LEFT KNEE, WITHOUT IV CONTRAST 04/19/2024 04:25:37 AM TECHNIQUE: Axial images were acquired through the left knee without IV contrast. Reformatted images were reviewed. There is extensive metallic streak artifact from a total joint arthroplasty, limiting the study. Automated exposure control, iterative reconstruction, and/or weight based adjustment of the mA/kV was utilized to reduce the radiation dose to as low as reasonably achievable. COMPARISON: 4 views knee series earlier today. CLINICAL HISTORY: Knee trauma, tibial plateau fracture (Age >= 5y); Periprosthetic left anterior proximal tibial fracture, orthopedics requests additional evaluation with CT scan. FINDINGS: Technical limitations: There is extensive spray artifact related to metal hardware from a total joint arthroplasty. This significantly limits fine detail. BONES: There is a periprosthetic fracture about the tibial hardware involving the anterior proximal tibial metaphysis. The largest of the anterior fragments is displaced a few mm cephalad relative to the parent bone with slight upward rotation. No other displaced fracture is seen. There is generalized osteopenia. JOINTS: No joint dislocation. There are a number of small calcifications in the anterior joint  space, which do not appear related to the fracture. There is  a hemarthrosis in the suprapatellar bursal space also seen with scattered calcifications within the hemorrhagic fluid. SOFT TISSUES: Diffuse stranding edema seen in soft tissues. There is a sizable proximal pretibial hematoma associated with the injury, maximum diameter of 8.6 cm transverse, 1.7 cm AP and 7.5 cm craniocaudal. No deep space collection is seen through the metal artifacts. There does appear to be a grossly normal muscle bulk for an 88 year old. The popliteal trifurcation arteries show near circumferential moderate to heavy calcifications. The area tendons obscured by the metallic artifacts. IMPRESSION: 1. Periprosthetic fracture about the tibial hardware involving the anterior proximal tibial metaphysis, with the largest anterior fragment displaced a few millimeters cephalad with slight upward rotation. 2. Hemarthrosis in the suprapatellar bursal space with scattered calcifications within the hemorrhagic fluid. The calcifications are probably chronic. 3. Sizable proximal pretibial hematoma associated with the injury, measuring 8.6 x 1.7 x 7.5 cm. 4. Extensive metallic streak artifact from a total joint arthroplasty that limits evaluation. Electronically signed by: Francis Quam MD 04/19/2024 04:55 AM EST RP Workstation: [id number]   DG Chest Port 1 View Result Date: 04/19/2024 EXAM: 1 VIEW(S) XRAY OF THE CHEST 04/19/2024 01:48:00 AM COMPARISON: None available. CLINICAL HISTORY: Pre-op. FINDINGS: LUNGS AND PLEURA: Low lung volumes. No focal pulmonary opacity. No pleural effusion. No pneumothorax. HEART AND MEDIASTINUM: Aortic atherosclerosis. No acute abnormality of the cardiac and mediastinal silhouettes. BONES AND SOFT TISSUES: Partially imaged lumbar fusion hardware. Degenerative changes of right shoulder with calcific tendinopathy. Left shoulder arthroplasty noted. IMPRESSION: 1. No acute cardiopulmonary abnormality detected. Electronically signed by: Norman Gatlin MD 04/19/2024 01:55 AM EST RP  Workstation: [id number]   DG Thoracic Spine 2 View Result Date: 04/19/2024 EXAM: 2 VIEW(S) XRAY OF THE THORACIC SPINE 04/19/2024 01:48:00 AM COMPARISON: None available. CLINICAL HISTORY: pain/contusion after fall FINDINGS: BONES: Demineralization. No acute fracture. No aggressive appearing osseous lesion. Alignment is normal. DISCS AND DEGENERATIVE CHANGES: Multilevel age commensurate spondylosis. SOFT TISSUES: The visualized lungs are clear. IMPRESSION: 1. No acute abnormality of the thoracic spine. Electronically signed by: Norman Gatlin MD 04/19/2024 01:55 AM EST RP Workstation: [id number]   DG Hip Unilat W or Wo Pelvis 2-3 Views Left Result Date: 04/19/2024 EXAM: 2 OR MORE VIEW(S) XRAY OF THE PELVIS AND LEFT HIP 04/19/2024 01:48:00 AM COMPARISON: None available. CLINICAL HISTORY: pain/contusion after fall FINDINGS: BONES AND JOINTS: SI joints are symmetric. No acute fracture. Bilateral hips demonstrate normal alignment. Mild degenerative changes of bilateral hip joints characterized by mild joint space narrowing and osteophytosis of the superior acetabulum. LUMBAR SPINE: Partially seen lumbar spine fusion hardware. SOFT TISSUES: Vascular calcifications noted. IMPRESSION: 1. No acute abnormality of the left hip. Electronically signed by: Norman Gatlin MD 04/19/2024 01:53 AM EST RP Workstation: [id number]   DG Knee Complete 4 Views Left Result Date: 04/19/2024 EXAM: 4 VIEW(S) XRAY OF THE LEFT KNEE 04/19/2024 01:48:00 AM COMPARISON: None available. CLINICAL HISTORY: [id number] Injury [id number] FINDINGS: BONES AND JOINTS: Total knee arthroplasty in place. Displaced periprosthetic fracture of proximal anterior tibia. No joint dislocation. No significant joint effusion. SOFT TISSUES: Subcutaneous soft tissue edema. Vascular calcifications. IMPRESSION: 1. Displaced periprosthetic fracture of proximal anterior tibia. Electronically signed by: Norman Gatlin MD 04/19/2024 01:52 AM EST RP Workstation: [id number]    Data Reviewed: Relevant notes from primary care and specialist visits, past discharge summaries as available in EHR, including Care Everywhere . Prior diagnostic testing as pertinent to current admission diagnoses, Updated medications and problem lists for  reconciliation .ED course, including vitals, labs, imaging, treatment and response to treatment,Triage notes, nursing and pharmacy notes and ED provider's notes.Notable results as noted in HPI.Discussed case with EDMD/ ED APP/ or Specialty MD on call and as needed.  Assessment & Plan    >> Fall: Pt denies LOC and describes a mechanical fall.  Fall precautions and orthostatic vitals.    >> Left knee pain secondary to periprosthetic fracture: Orthopedic consulted, knee immobilizer, pain control. DVT prophylaxis every 12 hours x 4 doses. Bedrest and fall precautions.   >> Essential hypertension: Vitals:   04/19/24 1300 04/19/24 1500 04/19/24 1536  BP: (!) 124/48 (!) 130/54 (!) 130/54  We will hold meds. Cont ivf hydration.   >>Hypotension: We will occult stools/ orthostatic vitals when stable and anemia panel and type/screen.   >> Anemia:    Latest Ref Rng & Units 04/19/2024    1:05 AM 11/28/2015    5:33 AM 11/27/2015    8:58 PM  CBC  WBC 4.0 - 10.5 K/uL 15.1  5.9  5.5   Hemoglobin 12.0 - 15.0 g/dL 89.0  87.8  87.9   Hematocrit 36.0 - 46.0 % 34.5  36.3  34.7   Platelets 150 - 400 K/uL 193  185  185   Am concerned about possible chronic GI loss. We will follow.  CBC today.    >> Leukocytosis: Do not suspect infection, could be reactive from fracture and anemia.    DVT prophylaxis:  Heparin q12h x 4 doses.   Consults:  Orthopedic.   Advance Care Planning:    Code Status: Full Code   Family Communication:  None.  Disposition Plan:  None.  Severity of Illness: The appropriate patient status for this patient is INPATIENT. Inpatient status is judged to be reasonable and necessary in order to provide the required  intensity of service to ensure the patient's safety. The patient's presenting symptoms, physical exam findings, and initial radiographic and laboratory data in the context of their chronic comorbidities is felt to place them at high risk for further clinical deterioration. Furthermore, it is not anticipated that the patient will be medically stable for discharge from the hospital within 2 midnights of admission.   * I certify that at the point of admission it is my clinical judgment that the patient will require inpatient hospital care spanning beyond 2 midnights from the point of admission due to high intensity of service, high risk for further deterioration and high frequency of surveillance required.*  Unresulted Labs (From admission, onward)     Start     Ordered   04/20/24 0500  Occult blood card to lab, stool RN will collect  Daily,   R     Question:  Specimen to be collected by:  Answer:  RN will collect   04/19/24 1344   04/19/24 1345  Comprehensive metabolic panel with GFR  Daily,   R      04/19/24 1344   04/19/24 1345  CBC with Differential/Platelet  Daily,   R      04/19/24 1344   04/19/24 1340  Vitamin B12  (Anemia Panel (PNL))  Once,   R        04/19/24 1344   04/19/24 1340  Folate  (Anemia Panel (PNL))  Once,   R        04/19/24 1344   04/19/24 1340  Iron and TIBC  (Anemia Panel (PNL))  Once,   R        04/19/24 1344  04/19/24 1340  Ferritin  (Anemia Panel (PNL))  Once,   R        04/19/24 1344   04/19/24 1340  Reticulocytes  (Anemia Panel (PNL))  Once,   R        04/19/24 1344   04/19/24 1340  T4, free  Add-on,   AD        04/19/24 1344   04/19/24 1340  TSH  Add-on,   AD        04/19/24 1344   04/19/24 1340  Hemoglobin A1c  Add-on,   AD        04/19/24 1344   04/19/24 1337  Type and screen [facility]  Once,   R       Comments: [facility]    04/19/24 1344            Meds ordered this encounter  Medications   morphine (PF) 2  MG/ML injection 2 mg   polyethylene glycol (MIRALAX / GLYCOLAX) packet 17 g   bisacodyl (DULCOLAX) EC tablet 5 mg   heparin injection 5,000 Units   ALPRAZolam (XANAX) tablet 0.25 mg   atorvastatin (LIPITOR) tablet 40 mg   pantoprazole  (PROTONIX ) injection 40 mg     Orders Placed This Encounter  Procedures   Comprehensive metabolic panel with GFR   CBC with Differential/Platelet   Vitamin B12   Folate   Iron and TIBC   Ferritin   Reticulocytes   Occult blood card to lab, stool RN will collect   T4, free   TSH   Hemoglobin A1c   Diet Heart Room service appropriate? Yes; Fluid consistency: Thin   SCDs   Vital signs every hour x 4, then every 4 hours   Neurovascular checks every hour x 4, then every 4 hours   Apply ice to affected area   Turn cough deep breathe   Incentive spirometry   Bed rest with HOB to 30 degrees   Intake and output   If diabetic or glucose greater than 140mg /dl, notify physician to place Gylcemic Control (SSI) Order Set   Initiate Oral Care Protocol   Initiate Carrier Fluid Protocol   Document Pasero Opioid-Induced Sedation Scale (POSS) per protocol (see sidebar report)   Apply knee immobilizer   Maintain Knee Immobilizer   Strict intake and output   Neurovascular checks   Cardiac Monitoring - Continuous Indefinite   Full code   Consult to Transition of Care Team   Consult to Registered Dietitian   Oxygen therapy Mode or (Route): Nasal cannula; Liters Per Minute: 2   Pulse oximetry, continuous   Pulse oximetry, every 4 hours   EKG 12-Lead   Type and screen [facility]   Admit to Inpatient (patient's expected length of stay will be greater than 2 midnights or inpatient only procedure)   Aspiration precautions   Fall precautions    Author: Mario LULLA Blanch, MD 12 pm- 8 pm. [facility]. 04/19/2024 3:38 PM Please note for any communication after hours contact [facility] Assigned provider on call on Amion.

## 2024-04-19 NOTE — ED Triage Notes (Signed)
 Pt BIB EMS c/o left knee pain s/p fall this evening. Pt reports she climbed a stepstool, missed her step causing her to fall, denies hitting her head. Per EMS reports she had a witnessed syncopal episode with them that lastt appx one minute, and pt return to baseline. A&Ox4. Pt with swelling and bruising to left knee, PMHx knee replacement.

## 2024-04-20 DIAGNOSIS — Y92009 Unspecified place in unspecified non-institutional (private) residence as the place of occurrence of the external cause: Secondary | ICD-10-CM | POA: Diagnosis not present

## 2024-04-20 DIAGNOSIS — W19XXXA Unspecified fall, initial encounter: Secondary | ICD-10-CM | POA: Diagnosis not present

## 2024-04-20 DIAGNOSIS — I10 Essential (primary) hypertension: Secondary | ICD-10-CM | POA: Diagnosis not present

## 2024-04-20 LAB — COMPREHENSIVE METABOLIC PANEL WITH GFR
ALT: 18 U/L (ref 0–44)
AST: 27 U/L (ref 15–41)
Albumin: 2.6 g/dL — ABNORMAL LOW (ref 3.5–5.0)
Alkaline Phosphatase: 34 U/L — ABNORMAL LOW (ref 38–126)
Anion gap: 9 (ref 5–15)
BUN: 17 mg/dL (ref 8–23)
CO2: 21 mmol/L — ABNORMAL LOW (ref 22–32)
Calcium: 8 mg/dL — ABNORMAL LOW (ref 8.9–10.3)
Chloride: 103 mmol/L (ref 98–111)
Creatinine, Ser: 0.88 mg/dL (ref 0.44–1.00)
GFR, Estimated: >60 mL/min (ref >60)
Glucose, Bld: 114 mg/dL — ABNORMAL HIGH (ref 70–99)
Potassium: 4.4 mmol/L (ref 3.5–5.1)
Sodium: 133 mmol/L — ABNORMAL LOW (ref 135–145)
Total Bilirubin: 0.5 mg/dL (ref 0.0–1.2)
Total Protein: 4.7 g/dL — ABNORMAL LOW (ref 6.5–8.1)

## 2024-04-20 LAB — CBC
HCT: 24.8 % — ABNORMAL LOW (ref 36.0–46.0)
HCT: 24.8 % — ABNORMAL LOW (ref 36.0–46.0)
Hemoglobin: 8.1 g/dL — ABNORMAL LOW (ref 12.0–15.0)
Hemoglobin: 8.2 g/dL — ABNORMAL LOW (ref 12.0–15.0)
MCH: 31.6 pg (ref 26.0–34.0)
MCH: 31.5 pg (ref 26.0–34.0)
MCHC: 32.7 g/dL (ref 30.0–36.0)
MCHC: 33.1 g/dL (ref 30.0–36.0)
MCV: 96.9 fL (ref 80.0–100.0)
MCV: 95.4 fL (ref 80.0–100.0)
Platelets: 114 K/uL — ABNORMAL LOW (ref 150–400)
Platelets: 114 K/uL — ABNORMAL LOW (ref 150–400)
RBC: 2.56 MIL/uL — ABNORMAL LOW (ref 3.87–5.11)
RBC: 2.6 MIL/uL — ABNORMAL LOW (ref 3.87–5.11)
RDW: 14.2 % (ref 11.5–15.5)
RDW: 14.2 % (ref 11.5–15.5)
WBC: 7 K/uL (ref 4.0–10.5)
WBC: 7.4 K/uL (ref 4.0–10.5)
nRBC: 0 % (ref 0.0–0.2)
nRBC: 0 % (ref 0.0–0.2)

## 2024-04-20 LAB — CBC WITH DIFFERENTIAL/PLATELET
Abs Immature Granulocytes: 0.03 K/uL (ref 0.00–0.07)
Basophils Absolute: 0 K/uL (ref 0.0–0.1)
Basophils Relative: 0 %
Eosinophils Absolute: 0.1 K/uL (ref 0.0–0.5)
Eosinophils Relative: 1 %
HCT: 21.6 % — ABNORMAL LOW (ref 36.0–46.0)
Hemoglobin: 6.9 g/dL — CL (ref 12.0–15.0)
Immature Granulocytes: 1 %
Lymphocytes Relative: 32 %
Lymphs Abs: 2 K/uL (ref 0.7–4.0)
MCH: 30.9 pg (ref 26.0–34.0)
MCHC: 31.9 g/dL (ref 30.0–36.0)
MCV: 96.9 fL (ref 80.0–100.0)
Monocytes Absolute: 0.7 K/uL (ref 0.1–1.0)
Monocytes Relative: 11 %
Neutro Abs: 3.4 K/uL (ref 1.7–7.7)
Neutrophils Relative %: 55 %
Platelets: 125 K/uL — ABNORMAL LOW (ref 150–400)
RBC: 2.23 MIL/uL — ABNORMAL LOW (ref 3.87–5.11)
RDW: 14.2 % (ref 11.5–15.5)
WBC: 6.2 K/uL (ref 4.0–10.5)
nRBC: 0 % (ref 0.0–0.2)

## 2024-04-20 LAB — PREPARE RBC (CROSSMATCH)

## 2024-04-20 MED ORDER — SENNOSIDES-DOCUSATE SODIUM 8.6-50 MG PO TABS
1.0000 | ORAL_TABLET | Freq: Two times a day (BID) | ORAL | Status: DC
  Administered 2024-04-20 – 2024-04-26 (×11): 1 via ORAL
  Filled 2024-04-20 (×12): qty 1

## 2024-04-20 MED ORDER — SODIUM CHLORIDE 0.9% IV SOLUTION: Freq: Once | INTRAVENOUS | Status: AC

## 2024-04-20 MED ORDER — PREDNISOLONE ACETATE 1 % OP SUSP
1.0000 [drp] | Freq: Every day | OPHTHALMIC | Status: DC
  Administered 2024-04-20 – 2024-04-25 (×6): 1 [drp] via OPHTHALMIC
  Filled 2024-04-20 (×2): qty 5

## 2024-04-20 MED ORDER — HYDRALAZINE HCL 20 MG/ML IJ SOLN: 5.0000 mg | INTRAMUSCULAR | Status: DC | PRN

## 2024-04-20 NOTE — H&P (View-Only) (Signed)
 Reason for Consult:left knee periprosthetic fracture Referring Physician: [facility]  [name] is an 88 y.o. female.  HPI: [name] is a 88 y.o. female with past medical history of  essential HTN, osteoporosis coming for fall and left total knee arthroplasty.  Patient was brought Friday evening at [facility] for a fall when she was climbing a stepstool and missed her step with no trauma reported to her head however per chart review and RN note. EMS noted a syncopal episode when they were with her for about a minute after which patient returned back to her baseline.  Patient fell on her left knee which she has a history of surgery on in the past. Case was discussed by EDMD at [facility] with Dr. Gust orthopedics at [facility] who recommended patient to a higher level facility for definitive orthopedic care.  Past Medical History:  Diagnosis Date   Arthritis    BCC (basal cell carcinoma of skin) 06/21/2023   right nasal sidewall, refer for Mohs   GERD (gastroesophageal reflux disease)    Hx of basal cell carcinoma 01/13/2013   left parasternal chest   Hx of basal cell carcinoma 04/26/2015   right sacral area   Hx of basal cell carcinoma 04/26/2016   left temple   Hypertension     Past Surgical History:  Procedure Laterality Date   ABDOMINAL HYSTERECTOMY     APPENDECTOMY     BACK SURGERY     COLONOSCOPY WITH PROPOFOL  N/A 03/07/2017   Procedure: COLONOSCOPY WITH PROPOFOL ;  Surgeon: Toledo, Ladell POUR, MD;  Location: [facility] ENDOSCOPY;  Service: Gastroenterology;  Laterality: N/A;   ESOPHAGOGASTRODUODENOSCOPY N/A 03/07/2017   Procedure: ESOPHAGOGASTRODUODENOSCOPY (EGD);  Surgeon: Toledo, Ladell POUR, MD;  Location: [facility] ENDOSCOPY;  Service: Gastroenterology;  Laterality: N/A;   FOOT SURGERY Left 05/2016   JOINT REPLACEMENT Bilateral 2010 2011   REPLACEMENT TOTAL KNEE BILATERAL  2010    Family History  Problem Relation Age of Onset   Breast cancer Maternal Grandmother     Social History:   reports that she has never smoked. She does not have any smokeless tobacco history on file. She reports that she does not drink alcohol. No history on file for drug use.  Allergies:  Allergies  Allergen Reactions   Tramadol-Acetaminophen  Nausea Only    Other reaction(s): Other (See Comments) AMS    Medications: I have reviewed the patient's current medications.  Results for orders placed or performed during the hospital encounter of 04/19/24 (from the past 48 hours)  Comprehensive metabolic panel with GFR     Status: Abnormal   Collection Time: 04/19/24  2:46 PM  Result Value Ref Range   Sodium 137 135 - 145 mmol/L   Potassium 4.6 3.5 - 5.1 mmol/L   Chloride 105 98 - 111 mmol/L   CO2 20 (L) 22 - 32 mmol/L   Glucose, Bld 94 70 - 99 mg/dL    Comment: Glucose reference range applies only to samples taken after fasting for at least 8 hours.   BUN 17 8 - 23 mg/dL   Creatinine, Ser 9.10 0.44 - 1.00 mg/dL   Calcium 8.4 (L) 8.9 - 10.3 mg/dL   Total Protein 5.2 (L) 6.5 - 8.1 g/dL   Albumin 3.0 (L) 3.5 - 5.0 g/dL   AST 30 15 - 41 U/L   ALT 20 0 - 44 U/L   Alkaline Phosphatase 37 (L) 38 - 126 U/L   Total Bilirubin 0.6 0.0 - 1.2 mg/dL  GFR, Estimated >60 >60 mL/min    Comment: (NOTE) Calculated using the CKD-EPI Creatinine Equation (2021)    Anion gap 12 5 - 15    Comment: Performed at [facility], [address] [place], [place] [address]  Vitamin B12     Status: Abnormal   Collection Time: 04/19/24  2:46 PM  Result Value Ref Range   Vitamin B-12 1,399 (H) 180 - 914 pg/mL    Comment: (NOTE) This assay is not validated for testing neonatal or myeloproliferative syndrome specimens for Vitamin B12 levels. Performed at [facility], [address] [place], [place] [address]   Folate     Status: None   Collection Time: 04/19/24  2:46 PM  Result Value Ref Range   Folate >20.0 >5.9 ng/mL    Comment: Performed at [facility], [address], [place],  [place] [address]  Iron and TIBC     Status: Abnormal   Collection Time: 04/19/24  2:46 PM  Result Value Ref Range   Iron 28 28 - 170 ug/dL   TIBC 771 (L) 749 - 549 ug/dL   Saturation Ratios 12 10.4 - 31.8 %   UIBC 200 ug/dL    Comment: Performed at [facility], [address], [place], [place] [address]  Ferritin     Status: None   Collection Time: 04/19/24  2:46 PM  Result Value Ref Range   Ferritin 113 11 - 307 ng/mL    Comment: Performed at [facility], [address] [place], [place] [address]  T4, free     Status: None   Collection Time: 04/19/24  2:46 PM  Result Value Ref Range   Free T4 0.85 0.61 - 1.12 ng/dL    Comment: (NOTE) Biotin ingestion may interfere with free T4 tests. If the results are inconsistent with the TSH level, previous test results, or the clinical presentation, then consider biotin interference. If needed, order repeat testing after stopping biotin. Performed at [facility], [address], [place], [place] [address]   Type and screen [facility]     Status: None (Preliminary result)   Collection Time: 04/19/24  6:44 PM  Result Value Ref Range   ABO/RH(D) A POS    Antibody Screen NEG    Sample Expiration 04/22/2024,2359    Unit Number [id number]    Blood Component Type RED CELLS,LR    Unit division 00    Status of Unit ISSUED    Transfusion Status OK TO TRANSFUSE    Crossmatch Result      Compatible Performed at [facility], [address] [place], [place] [address]   Reticulocytes     Status: Abnormal   Collection Time: 04/19/24  6:45 PM  Result Value Ref Range   Retic Ct Pct 1.1 0.4 - 3.1 %   RBC. 2.37 (L) 3.87 - 5.11 MIL/uL   Retic Count, Absolute 25.8 19.0 - 186.0 K/uL   Immature Retic Fract 6.7 2.3 - 15.9 %    Comment: Performed at [facility], [address], [place], [place] [address]  TSH     Status: None   Collection Time: 04/19/24  6:45 PM  Result Value Ref Range   TSH 1.802 0.350  - 4.500 uIU/mL    Comment: Performed by a 3rd Generation assay with a functional sensitivity of <=0.01 uIU/mL. Performed at [facility], [address] [place], [place]  [address]   Comprehensive metabolic panel with GFR     Status: Abnormal   Collection Time: 04/20/24  3:45 AM  Result Value Ref Range   Sodium 133 (L) 135 - 145 mmol/L   Potassium 4.4 3.5 - 5.1 mmol/L   Chloride 103 98 - 111 mmol/L   CO2 21 (L) 22 - 32 mmol/L   Glucose, Bld 114 (H) 70 - 99 mg/dL    Comment: Glucose reference range applies only to samples taken after fasting for at least 8 hours.   BUN 17 8 - 23 mg/dL   Creatinine, Ser 9.11 0.44 - 1.00 mg/dL   Calcium 8.0 (L) 8.9 - 10.3 mg/dL   Total Protein 4.7 (L) 6.5 - 8.1 g/dL   Albumin 2.6 (L) 3.5 - 5.0 g/dL   AST 27 15 - 41 U/L   ALT 18 0 - 44 U/L   Alkaline Phosphatase 34 (L) 38 - 126 U/L   Total Bilirubin 0.5 0.0 - 1.2 mg/dL   GFR, Estimated >39 >39 mL/min    Comment: (NOTE) Calculated using the CKD-EPI Creatinine Equation (2021)    Anion gap 9 5 - 15    Comment: Performed at [facility], [address], [place], [place] [address]  CBC with Differential/Platelet     Status: Abnormal   Collection Time: 04/20/24  3:45 AM  Result Value Ref Range   WBC 6.2 4.0 - 10.5 K/uL   RBC 2.23 (L) 3.87 - 5.11 MIL/uL   Hemoglobin 6.9 (LL) 12.0 - 15.0 g/dL    Comment: REPEATED TO VERIFY This critical result has been called to CANDIE LEE RN by Ronal Rumalda Chang on 04/20/2024 04:32:07, and has been read back.    HCT 21.6 (L) 36.0 - 46.0 %   MCV 96.9 80.0 - 100.0 fL   MCH 30.9 26.0 - 34.0 pg   MCHC 31.9 30.0 - 36.0 g/dL   RDW 85.7 88.4 - 84.4 %   Platelets 125 (L) 150 - 400 K/uL    Comment: REPEATED TO VERIFY   nRBC 0.0 0.0 - 0.2 %   Neutrophils Relative % 55 %   Neutro Abs 3.4 1.7 - 7.7 K/uL   Lymphocytes Relative 32 %   Lymphs Abs 2.0 0.7 - 4.0 K/uL   Monocytes Relative 11 %   Monocytes Absolute 0.7 0.1 - 1.0 K/uL   Eosinophils Relative 1 %    Eosinophils Absolute 0.1 0.0 - 0.5 K/uL   Basophils Relative 0 %   Basophils Absolute 0.0 0.0 - 0.1 K/uL   Immature Granulocytes 1 %   Abs Immature Granulocytes 0.03 0.00 - 0.07 K/uL    Comment: Performed at [facility], [address] [place], [place] [address]  Prepare RBC (crossmatch)     Status: None   Collection Time: 04/20/24  5:07 AM  Result Value Ref Range   Order Confirmation      ORDER PROCESSED BY BLOOD BANK Performed at [facility], [address], [place], [place] [address]     CT Knee Left Wo Contrast Result Date: 04/19/2024 EXAM: CT LEFT KNEE, WITHOUT IV CONTRAST 04/19/2024 04:25:37 AM TECHNIQUE: Axial images were acquired through the left knee without IV contrast. Reformatted images were reviewed. There is extensive metallic streak artifact from a total joint arthroplasty, limiting the study. Automated exposure control, iterative reconstruction, and/or weight based adjustment of the mA/kV was utilized to reduce the radiation dose to as low as reasonably achievable. COMPARISON: 4 views knee  series earlier today. CLINICAL HISTORY: Knee trauma, tibial plateau fracture (Age >= 5y); Periprosthetic left anterior proximal tibial fracture, orthopedics requests additional evaluation with CT scan. FINDINGS: Technical limitations: There is extensive spray artifact related to metal hardware from a total joint arthroplasty. This significantly limits fine detail. BONES: There is a periprosthetic fracture about the tibial hardware involving the anterior proximal tibial metaphysis. The largest of the anterior fragments is displaced a few mm cephalad relative to the parent bone with slight upward rotation. No other displaced fracture is seen. There is generalized osteopenia. JOINTS: No joint dislocation. There are a number of small calcifications in the anterior joint space, which do not appear related to the fracture. There is a hemarthrosis in the suprapatellar bursal space also seen  with scattered calcifications within the hemorrhagic fluid. SOFT TISSUES: Diffuse stranding edema seen in soft tissues. There is a sizable proximal pretibial hematoma associated with the injury, maximum diameter of 8.6 cm transverse, 1.7 cm AP and 7.5 cm craniocaudal. No deep space collection is seen through the metal artifacts. There does appear to be a grossly normal muscle bulk for an 88 year old. The popliteal trifurcation arteries show near circumferential moderate to heavy calcifications. The area tendons obscured by the metallic artifacts. IMPRESSION: 1. Periprosthetic fracture about the tibial hardware involving the anterior proximal tibial metaphysis, with the largest anterior fragment displaced a few millimeters cephalad with slight upward rotation. 2. Hemarthrosis in the suprapatellar bursal space with scattered calcifications within the hemorrhagic fluid. The calcifications are probably chronic. 3. Sizable proximal pretibial hematoma associated with the injury, measuring 8.6 x 1.7 x 7.5 cm. 4. Extensive metallic streak artifact from a total joint arthroplasty that limits evaluation. Electronically signed by: Francis Quam MD 04/19/2024 04:55 AM EST RP Workstation: [id number]   DG Chest Port 1 View Result Date: 04/19/2024 EXAM: 1 VIEW(S) XRAY OF THE CHEST 04/19/2024 01:48:00 AM COMPARISON: None available. CLINICAL HISTORY: Pre-op. FINDINGS: LUNGS AND PLEURA: Low lung volumes. No focal pulmonary opacity. No pleural effusion. No pneumothorax. HEART AND MEDIASTINUM: Aortic atherosclerosis. No acute abnormality of the cardiac and mediastinal silhouettes. BONES AND SOFT TISSUES: Partially imaged lumbar fusion hardware. Degenerative changes of right shoulder with calcific tendinopathy. Left shoulder arthroplasty noted. IMPRESSION: 1. No acute cardiopulmonary abnormality detected. Electronically signed by: Norman Gatlin MD 04/19/2024 01:55 AM EST RP Workstation: [id number]   DG Thoracic Spine 2  View Result Date: 04/19/2024 EXAM: 2 VIEW(S) XRAY OF THE THORACIC SPINE 04/19/2024 01:48:00 AM COMPARISON: None available. CLINICAL HISTORY: pain/contusion after fall FINDINGS: BONES: Demineralization. No acute fracture. No aggressive appearing osseous lesion. Alignment is normal. DISCS AND DEGENERATIVE CHANGES: Multilevel age commensurate spondylosis. SOFT TISSUES: The visualized lungs are clear. IMPRESSION: 1. No acute abnormality of the thoracic spine. Electronically signed by: Norman Gatlin MD 04/19/2024 01:55 AM EST RP Workstation: [id number]   DG Hip Unilat W or Wo Pelvis 2-3 Views Left Result Date: 04/19/2024 EXAM: 2 OR MORE VIEW(S) XRAY OF THE PELVIS AND LEFT HIP 04/19/2024 01:48:00 AM COMPARISON: None available. CLINICAL HISTORY: pain/contusion after fall FINDINGS: BONES AND JOINTS: SI joints are symmetric. No acute fracture. Bilateral hips demonstrate normal alignment. Mild degenerative changes of bilateral hip joints characterized by mild joint space narrowing and osteophytosis of the superior acetabulum. LUMBAR SPINE: Partially seen lumbar spine fusion hardware. SOFT TISSUES: Vascular calcifications noted. IMPRESSION: 1. No acute abnormality of the left hip. Electronically signed by: Norman Gatlin MD 04/19/2024 01:53 AM EST RP Workstation: [id number]   DG Knee Complete 4 Views Left Result Date: 04/19/2024 EXAM:  4 VIEW(S) XRAY OF THE LEFT KNEE 04/19/2024 01:48:00 AM COMPARISON: None available. CLINICAL HISTORY: [id number] Injury [id number] FINDINGS: BONES AND JOINTS: Total knee arthroplasty in place. Displaced periprosthetic fracture of proximal anterior tibia. No joint dislocation. No significant joint effusion. SOFT TISSUES: Subcutaneous soft tissue edema. Vascular calcifications. IMPRESSION: 1. Displaced periprosthetic fracture of proximal anterior tibia. Electronically signed by: Norman Gatlin MD 04/19/2024 01:52 AM EST RP Workstation: [id number]    Review of Systems  Constitutional:   Negative for chills and fever.  Respiratory:  Negative for cough and shortness of breath.   Cardiovascular:  Negative for chest pain.  Gastrointestinal:  Negative for abdominal pain.  Musculoskeletal:  Positive for arthralgias, gait problem and joint swelling.  Neurological:  Negative for dizziness and weakness.  Psychiatric/Behavioral:  Negative for behavioral problems.    Blood pressure 128/67, pulse 74, temperature 99 F (37.2 C), temperature source Oral, resp. rate 19, height 5' 9 (1.753 m), weight 86.5 kg, SpO2 98%. Physical Exam Constitutional:      General: She is not in acute distress.    Appearance: Normal appearance. She is not ill-appearing.  HENT:     Head: Normocephalic and atraumatic.  Eyes:     Extraocular Movements: Extraocular movements intact.  Cardiovascular:     Rate and Rhythm: Normal rate.     Pulses: Normal pulses.  Pulmonary:     Effort: Pulmonary effort is normal. No respiratory distress.  Musculoskeletal:     Cervical back: Normal range of motion.     Comments: Left knee diffusely tender to palpation, mostly over the tibial tubercle. Incision from previous TKA healed with no signs of infection. Hematoma about the left knee with moderate soft tissue swelling in the left LE. Calves soft and nontender. Dorsiflexion and plantar flexion intact bilaterally. Distally neurovascularly intact.   Skin:    Capillary Refill: Capillary refill takes less than 2 seconds.  Neurological:     General: No focal deficit present.     Mental Status: She is alert and oriented to person, place, and time. Mental status is at baseline.  Psychiatric:        Behavior: Behavior normal.     Assessment/Plan: Left total knee periprosthetic fracture Periprosthetic fracture about the tibial hardware involving the anterior proximal tibial metaphysis, with the largest anterior fragment displaced a few millimeters cephalad with slight upward rotation. Patient will likely require surgical  intervention for this issue but informed her that we are making arrangement for total knee specialist to see her and determine best plan. It appears that she requires blood transfusion today. Medicine has arranged. Will make patient NPO after midnight in case surgical management can be achieved tomorrow. NWB, remain in knee immobilizer.  Morgan Keinath L. [place], PA-C 04/20/2024, 8:56 AM

## 2024-04-20 NOTE — Progress Notes (Signed)
 PROGRESS NOTE    [name]  FMW:[id number] DOB: 1935/08/22 DOA: 04/19/2024 PCP: Epifanio Alm SQUIBB, MD  No chief complaint on file.   Brief Narrative:   [name] is a 88 y.o. female with past medical history  of  essential HTN, osteoporosis coming for fall, where she sustained left knee periprosthetic fracture, transferred from [facility] to [facility] for orthopedic evaluation and surgery  Assessment & Plan:   Principal Problem:   Fall at home, initial encounter  Mechanical fall Left knee periprosthetic fracture -Management per orthopedic.  N.p.o. after midnight, continue with as needed pain regimen, continue with nonweightbearing.   Acute blood loss anemia Proximal pretibial hematoma -Hemoglobin was significant drop this morning to 6.9, most recent hemoglobin 10.8 last month as an outpatient -Received 1 unit PRBC transfusion,, will repeat CBC, will target hemoglobin at least of 8 in anticipation for surgery. - Significant hematoma on physical exam, and imaging, will continue to monitor closely, continue with supportive transfusion, - Hold aspirin , SCD for DVT prophylaxis  Leukocytosis -Likely stress related, normalized  Hypertension - Blood pressure is labile, so we will hold oral regimen and keep on as needed hydralazine   Osteoporosis - She is on Fosamax at home, will continue with calcium  with vitamin D  here  Hyperlipidemia Continue with atorvastatin -  GERD -Continue with PPI     DVT prophylaxis: (SCD) Code Status: (Full) Family Communication: (none at bedside) Disposition:   Status is: Inpatient    Consultants:  ortho   Subjective:  She denies any complaints this morning, pain is controlled  Objective: Vitals:   04/20/24 0545 04/20/24 0600 04/20/24 0800 04/20/24 0909  BP: 121/82 (!) 120/51 128/67 (!) 130/54  Pulse: 79 72 74 82  Resp: 20 (!) 21 19 20   Temp: 99.6 F (37.6 C) 99.7 F (37.6 C) 99 F (37.2 C) 99.2 F (37.3 C)  TempSrc:  Oral Oral Oral Oral  SpO2: 95% 96% 98% 96%  Weight:      Height:        Intake/Output Summary (Last 24 hours) at 04/20/2024 1035 Last data filed at 04/20/2024 0909 Gross per 24 hour  Intake 648 ml  Output 500 ml  Net 148 ml   Filed Weights   04/19/24 1536  Weight: 86.5 kg    Examination:  Awake Alert, Oriented X 3, No new F.N deficits, Normal affect CTAB RRR,No Gallops +ve B.Sounds, Abd Soft Left knee with tenderness to palpation, significant ecchymosis and anterior area, neurovascularly distally intact    Data Reviewed: I have personally reviewed following labs and imaging studies  CBC: Recent Labs  Lab 04/19/24 0105 04/20/24 0345  WBC 15.1* 6.2  NEUTROABS  --  3.4  HGB 10.9* 6.9*  HCT 34.5* 21.6*  MCV 97.7 96.9  PLT 193 125*    Basic Metabolic Panel: Recent Labs  Lab 04/19/24 0105 04/19/24 1446 04/20/24 0345  NA 136 137 133*  K 4.0 4.6 4.4  CL 103 105 103  CO2 21* 20* 21*  GLUCOSE 131* 94 114*  BUN 17 17 17   CREATININE 0.92 0.89 0.88  CALCIUM  9.2 8.4* 8.0*    GFR: Estimated Creatinine Clearance: 53.8 mL/min (by C-G formula based on SCr of 0.88 mg/dL).  Liver Function Tests: Recent Labs  Lab 04/19/24 0105 04/19/24 1446 04/20/24 0345  AST 37 30 27  ALT 23 20 18   ALKPHOS 59 37* 34*  BILITOT 0.4 0.6 0.5  PROT 6.7 5.2* 4.7*  ALBUMIN 4.0 3.0* 2.6*    CBG: No  results for input(s): GLUCAP in the last 168 hours.   No results found for this or any previous visit (from the past 240 hours).       Radiology Studies: CT Knee Left Wo Contrast Result Date: 04/19/2024 EXAM: CT LEFT KNEE, WITHOUT IV CONTRAST 04/19/2024 04:25:37 AM TECHNIQUE: Axial images were acquired through the left knee without IV contrast. Reformatted images were reviewed. There is extensive metallic streak artifact from a total joint arthroplasty, limiting the study. Automated exposure control, iterative reconstruction, and/or weight based adjustment of the mA/kV was  utilized to reduce the radiation dose to as low as reasonably achievable. COMPARISON: 4 views knee series earlier today. CLINICAL HISTORY: Knee trauma, tibial plateau fracture (Age >= 5y); Periprosthetic left anterior proximal tibial fracture, orthopedics requests additional evaluation with CT scan. FINDINGS: Technical limitations: There is extensive spray artifact related to metal hardware from a total joint arthroplasty. This significantly limits fine detail. BONES: There is a periprosthetic fracture about the tibial hardware involving the anterior proximal tibial metaphysis. The largest of the anterior fragments is displaced a few mm cephalad relative to the parent bone with slight upward rotation. No other displaced fracture is seen. There is generalized osteopenia. JOINTS: No joint dislocation. There are a number of small calcifications in the anterior joint space, which do not appear related to the fracture. There is a hemarthrosis in the suprapatellar bursal space also seen with scattered calcifications within the hemorrhagic fluid. SOFT TISSUES: Diffuse stranding edema seen in soft tissues. There is a sizable proximal pretibial hematoma associated with the injury, maximum diameter of 8.6 cm transverse, 1.7 cm AP and 7.5 cm craniocaudal. No deep space collection is seen through the metal artifacts. There does appear to be a grossly normal muscle bulk for an 88 year old. The popliteal trifurcation arteries show near circumferential moderate to heavy calcifications. The area tendons obscured by the metallic artifacts. IMPRESSION: 1. Periprosthetic fracture about the tibial hardware involving the anterior proximal tibial metaphysis, with the largest anterior fragment displaced a few millimeters cephalad with slight upward rotation. 2. Hemarthrosis in the suprapatellar bursal space with scattered calcifications within the hemorrhagic fluid. The calcifications are probably chronic. 3. Sizable proximal pretibial  hematoma associated with the injury, measuring 8.6 x 1.7 x 7.5 cm. 4. Extensive metallic streak artifact from a total joint arthroplasty that limits evaluation. Electronically signed by: Francis Quam MD 04/19/2024 04:55 AM EST RP Workstation: [id number]   DG Chest Port 1 View Result Date: 04/19/2024 EXAM: 1 VIEW(S) XRAY OF THE CHEST 04/19/2024 01:48:00 AM COMPARISON: None available. CLINICAL HISTORY: Pre-op. FINDINGS: LUNGS AND PLEURA: Low lung volumes. No focal pulmonary opacity. No pleural effusion. No pneumothorax. HEART AND MEDIASTINUM: Aortic atherosclerosis. No acute abnormality of the cardiac and mediastinal silhouettes. BONES AND SOFT TISSUES: Partially imaged lumbar fusion hardware. Degenerative changes of right shoulder with calcific tendinopathy. Left shoulder arthroplasty noted. IMPRESSION: 1. No acute cardiopulmonary abnormality detected. Electronically signed by: Norman Gatlin MD 04/19/2024 01:55 AM EST RP Workstation: [id number]   DG Thoracic Spine 2 View Result Date: 04/19/2024 EXAM: 2 VIEW(S) XRAY OF THE THORACIC SPINE 04/19/2024 01:48:00 AM COMPARISON: None available. CLINICAL HISTORY: pain/contusion after fall FINDINGS: BONES: Demineralization. No acute fracture. No aggressive appearing osseous lesion. Alignment is normal. DISCS AND DEGENERATIVE CHANGES: Multilevel age commensurate spondylosis. SOFT TISSUES: The visualized lungs are clear. IMPRESSION: 1. No acute abnormality of the thoracic spine. Electronically signed by: Norman Gatlin MD 04/19/2024 01:55 AM EST RP Workstation: [id number]   DG Hip Unilat W or Wo Pelvis 2-3  Views Left Result Date: 04/19/2024 EXAM: 2 OR MORE VIEW(S) XRAY OF THE PELVIS AND LEFT HIP 04/19/2024 01:48:00 AM COMPARISON: None available. CLINICAL HISTORY: pain/contusion after fall FINDINGS: BONES AND JOINTS: SI joints are symmetric. No acute fracture. Bilateral hips demonstrate normal alignment. Mild degenerative changes of bilateral hip joints  characterized by mild joint space narrowing and osteophytosis of the superior acetabulum. LUMBAR SPINE: Partially seen lumbar spine fusion hardware. SOFT TISSUES: Vascular calcifications noted. IMPRESSION: 1. No acute abnormality of the left hip. Electronically signed by: Norman Gatlin MD 04/19/2024 01:53 AM EST RP Workstation: [id number]   DG Knee Complete 4 Views Left Result Date: 04/19/2024 EXAM: 4 VIEW(S) XRAY OF THE LEFT KNEE 04/19/2024 01:48:00 AM COMPARISON: None available. CLINICAL HISTORY: [id number] Injury [id number] FINDINGS: BONES AND JOINTS: Total knee arthroplasty in place. Displaced periprosthetic fracture of proximal anterior tibia. No joint dislocation. No significant joint effusion. SOFT TISSUES: Subcutaneous soft tissue edema. Vascular calcifications. IMPRESSION: 1. Displaced periprosthetic fracture of proximal anterior tibia. Electronically signed by: Norman Gatlin MD 04/19/2024 01:52 AM EST RP Workstation: [id number]        Scheduled Meds:  atorvastatin  40 mg Oral Daily   Chlorhexidine Gluconate Cloth  6 each Topical Daily   pantoprazole  (PROTONIX ) IV  40 mg Intravenous Q12H   prednisoLONE acetate  1 drop Both Eyes QHS   Continuous Infusions:   LOS: 1 day     Brayton Lye, MD [facility]   To contact the attending provider between 7A-7P or the covering provider during after hours 7P-7A, please log into the web site [email or web address] and access using universal [place] password for that web site. If you do not have the password, please call the hospital operator.  04/20/2024, 10:35 AM

## 2024-04-20 NOTE — Plan of Care (Signed)

## 2024-04-20 NOTE — Consult Note (Signed)
 Reason for Consult:left knee periprosthetic fracture Referring Physician: [facility]  [name] is an 88 y.o. female.  HPI: [name] is a 88 y.o. female with past medical history of  essential HTN, osteoporosis coming for fall and left total knee arthroplasty.  Patient was brought Friday evening at [facility] for a fall when she was climbing a stepstool and missed her step with no trauma reported to her head however per chart review and RN note. EMS noted a syncopal episode when they were with her for about a minute after which patient returned back to her baseline.  Patient fell on her left knee which she has a history of surgery on in the past. Case was discussed by EDMD at [facility] with Dr. Gust orthopedics at [facility] who recommended patient to a higher level facility for definitive orthopedic care.  Past Medical History:  Diagnosis Date   Arthritis    BCC (basal cell carcinoma of skin) 06/21/2023   right nasal sidewall, refer for Mohs   GERD (gastroesophageal reflux disease)    Hx of basal cell carcinoma 01/13/2013   left parasternal chest   Hx of basal cell carcinoma 04/26/2015   right sacral area   Hx of basal cell carcinoma 04/26/2016   left temple   Hypertension     Past Surgical History:  Procedure Laterality Date   ABDOMINAL HYSTERECTOMY     APPENDECTOMY     BACK SURGERY     COLONOSCOPY WITH PROPOFOL  N/A 03/07/2017   Procedure: COLONOSCOPY WITH PROPOFOL ;  Surgeon: Toledo, Ladell POUR, MD;  Location: [facility] ENDOSCOPY;  Service: Gastroenterology;  Laterality: N/A;   ESOPHAGOGASTRODUODENOSCOPY N/A 03/07/2017   Procedure: ESOPHAGOGASTRODUODENOSCOPY (EGD);  Surgeon: Toledo, Ladell POUR, MD;  Location: [facility] ENDOSCOPY;  Service: Gastroenterology;  Laterality: N/A;   FOOT SURGERY Left 05/2016   JOINT REPLACEMENT Bilateral 2010 2011   REPLACEMENT TOTAL KNEE BILATERAL  2010    Family History  Problem Relation Age of Onset   Breast cancer Maternal Grandmother     Social History:   reports that she has never smoked. She does not have any smokeless tobacco history on file. She reports that she does not drink alcohol. No history on file for drug use.  Allergies:  Allergies  Allergen Reactions   Tramadol-Acetaminophen  Nausea Only    Other reaction(s): Other (See Comments) AMS    Medications: I have reviewed the patient's current medications.  Results for orders placed or performed during the hospital encounter of 04/19/24 (from the past 48 hours)  Comprehensive metabolic panel with GFR     Status: Abnormal   Collection Time: 04/19/24  2:46 PM  Result Value Ref Range   Sodium 137 135 - 145 mmol/L   Potassium 4.6 3.5 - 5.1 mmol/L   Chloride 105 98 - 111 mmol/L   CO2 20 (L) 22 - 32 mmol/L   Glucose, Bld 94 70 - 99 mg/dL    Comment: Glucose reference range applies only to samples taken after fasting for at least 8 hours.   BUN 17 8 - 23 mg/dL   Creatinine, Ser 9.10 0.44 - 1.00 mg/dL   Calcium 8.4 (L) 8.9 - 10.3 mg/dL   Total Protein 5.2 (L) 6.5 - 8.1 g/dL   Albumin 3.0 (L) 3.5 - 5.0 g/dL   AST 30 15 - 41 U/L   ALT 20 0 - 44 U/L   Alkaline Phosphatase 37 (L) 38 - 126 U/L   Total Bilirubin 0.6 0.0 - 1.2 mg/dL  GFR, Estimated >60 >60 mL/min    Comment: (NOTE) Calculated using the CKD-EPI Creatinine Equation (2021)    Anion gap 12 5 - 15    Comment: Performed at [facility], [address] [place], [place] [address]  Vitamin B12     Status: Abnormal   Collection Time: 04/19/24  2:46 PM  Result Value Ref Range   Vitamin B-12 1,399 (H) 180 - 914 pg/mL    Comment: (NOTE) This assay is not validated for testing neonatal or myeloproliferative syndrome specimens for Vitamin B12 levels. Performed at [facility], [address] [place], [place] [address]   Folate     Status: None   Collection Time: 04/19/24  2:46 PM  Result Value Ref Range   Folate >20.0 >5.9 ng/mL    Comment: Performed at [facility], [address], [place],  [place] [address]  Iron and TIBC     Status: Abnormal   Collection Time: 04/19/24  2:46 PM  Result Value Ref Range   Iron 28 28 - 170 ug/dL   TIBC 771 (L) 749 - 549 ug/dL   Saturation Ratios 12 10.4 - 31.8 %   UIBC 200 ug/dL    Comment: Performed at [facility], [address], [place], [place] [address]  Ferritin     Status: None   Collection Time: 04/19/24  2:46 PM  Result Value Ref Range   Ferritin 113 11 - 307 ng/mL    Comment: Performed at [facility], [address] [place], [place] [address]  T4, free     Status: None   Collection Time: 04/19/24  2:46 PM  Result Value Ref Range   Free T4 0.85 0.61 - 1.12 ng/dL    Comment: (NOTE) Biotin ingestion may interfere with free T4 tests. If the results are inconsistent with the TSH level, previous test results, or the clinical presentation, then consider biotin interference. If needed, order repeat testing after stopping biotin. Performed at [facility], [address], [place], [place] [address]   Type and screen [facility]     Status: None (Preliminary result)   Collection Time: 04/19/24  6:44 PM  Result Value Ref Range   ABO/RH(D) A POS    Antibody Screen NEG    Sample Expiration 04/22/2024,2359    Unit Number [id number]    Blood Component Type RED CELLS,LR    Unit division 00    Status of Unit ISSUED    Transfusion Status OK TO TRANSFUSE    Crossmatch Result      Compatible Performed at [facility], [address] [place], [place] [address]   Reticulocytes     Status: Abnormal   Collection Time: 04/19/24  6:45 PM  Result Value Ref Range   Retic Ct Pct 1.1 0.4 - 3.1 %   RBC. 2.37 (L) 3.87 - 5.11 MIL/uL   Retic Count, Absolute 25.8 19.0 - 186.0 K/uL   Immature Retic Fract 6.7 2.3 - 15.9 %    Comment: Performed at [facility], [address], [place], [place] [address]  TSH     Status: None   Collection Time: 04/19/24  6:45 PM  Result Value Ref Range   TSH 1.802 0.350  - 4.500 uIU/mL    Comment: Performed by a 3rd Generation assay with a functional sensitivity of <=0.01 uIU/mL. Performed at [facility], [address] [place], [place]  [address]   Comprehensive metabolic panel with GFR     Status: Abnormal   Collection Time: 04/20/24  3:45 AM  Result Value Ref Range   Sodium 133 (L) 135 - 145 mmol/L   Potassium 4.4 3.5 - 5.1 mmol/L   Chloride 103 98 - 111 mmol/L   CO2 21 (L) 22 - 32 mmol/L   Glucose, Bld 114 (H) 70 - 99 mg/dL    Comment: Glucose reference range applies only to samples taken after fasting for at least 8 hours.   BUN 17 8 - 23 mg/dL   Creatinine, Ser 9.11 0.44 - 1.00 mg/dL   Calcium 8.0 (L) 8.9 - 10.3 mg/dL   Total Protein 4.7 (L) 6.5 - 8.1 g/dL   Albumin 2.6 (L) 3.5 - 5.0 g/dL   AST 27 15 - 41 U/L   ALT 18 0 - 44 U/L   Alkaline Phosphatase 34 (L) 38 - 126 U/L   Total Bilirubin 0.5 0.0 - 1.2 mg/dL   GFR, Estimated >39 >39 mL/min    Comment: (NOTE) Calculated using the CKD-EPI Creatinine Equation (2021)    Anion gap 9 5 - 15    Comment: Performed at [facility], [address], [place], [place] [address]  CBC with Differential/Platelet     Status: Abnormal   Collection Time: 04/20/24  3:45 AM  Result Value Ref Range   WBC 6.2 4.0 - 10.5 K/uL   RBC 2.23 (L) 3.87 - 5.11 MIL/uL   Hemoglobin 6.9 (LL) 12.0 - 15.0 g/dL    Comment: REPEATED TO VERIFY This critical result has been called to CANDIE LEE RN by Ronal Rumalda Chang on 04/20/2024 04:32:07, and has been read back.    HCT 21.6 (L) 36.0 - 46.0 %   MCV 96.9 80.0 - 100.0 fL   MCH 30.9 26.0 - 34.0 pg   MCHC 31.9 30.0 - 36.0 g/dL   RDW 85.7 88.4 - 84.4 %   Platelets 125 (L) 150 - 400 K/uL    Comment: REPEATED TO VERIFY   nRBC 0.0 0.0 - 0.2 %   Neutrophils Relative % 55 %   Neutro Abs 3.4 1.7 - 7.7 K/uL   Lymphocytes Relative 32 %   Lymphs Abs 2.0 0.7 - 4.0 K/uL   Monocytes Relative 11 %   Monocytes Absolute 0.7 0.1 - 1.0 K/uL   Eosinophils Relative 1 %    Eosinophils Absolute 0.1 0.0 - 0.5 K/uL   Basophils Relative 0 %   Basophils Absolute 0.0 0.0 - 0.1 K/uL   Immature Granulocytes 1 %   Abs Immature Granulocytes 0.03 0.00 - 0.07 K/uL    Comment: Performed at [facility], [address] [place], [place] [address]  Prepare RBC (crossmatch)     Status: None   Collection Time: 04/20/24  5:07 AM  Result Value Ref Range   Order Confirmation      ORDER PROCESSED BY BLOOD BANK Performed at [facility], [address], [place], [place] [address]     CT Knee Left Wo Contrast Result Date: 04/19/2024 EXAM: CT LEFT KNEE, WITHOUT IV CONTRAST 04/19/2024 04:25:37 AM TECHNIQUE: Axial images were acquired through the left knee without IV contrast. Reformatted images were reviewed. There is extensive metallic streak artifact from a total joint arthroplasty, limiting the study. Automated exposure control, iterative reconstruction, and/or weight based adjustment of the mA/kV was utilized to reduce the radiation dose to as low as reasonably achievable. COMPARISON: 4 views knee  series earlier today. CLINICAL HISTORY: Knee trauma, tibial plateau fracture (Age >= 5y); Periprosthetic left anterior proximal tibial fracture, orthopedics requests additional evaluation with CT scan. FINDINGS: Technical limitations: There is extensive spray artifact related to metal hardware from a total joint arthroplasty. This significantly limits fine detail. BONES: There is a periprosthetic fracture about the tibial hardware involving the anterior proximal tibial metaphysis. The largest of the anterior fragments is displaced a few mm cephalad relative to the parent bone with slight upward rotation. No other displaced fracture is seen. There is generalized osteopenia. JOINTS: No joint dislocation. There are a number of small calcifications in the anterior joint space, which do not appear related to the fracture. There is a hemarthrosis in the suprapatellar bursal space also seen  with scattered calcifications within the hemorrhagic fluid. SOFT TISSUES: Diffuse stranding edema seen in soft tissues. There is a sizable proximal pretibial hematoma associated with the injury, maximum diameter of 8.6 cm transverse, 1.7 cm AP and 7.5 cm craniocaudal. No deep space collection is seen through the metal artifacts. There does appear to be a grossly normal muscle bulk for an 88 year old. The popliteal trifurcation arteries show near circumferential moderate to heavy calcifications. The area tendons obscured by the metallic artifacts. IMPRESSION: 1. Periprosthetic fracture about the tibial hardware involving the anterior proximal tibial metaphysis, with the largest anterior fragment displaced a few millimeters cephalad with slight upward rotation. 2. Hemarthrosis in the suprapatellar bursal space with scattered calcifications within the hemorrhagic fluid. The calcifications are probably chronic. 3. Sizable proximal pretibial hematoma associated with the injury, measuring 8.6 x 1.7 x 7.5 cm. 4. Extensive metallic streak artifact from a total joint arthroplasty that limits evaluation. Electronically signed by: Francis Quam MD 04/19/2024 04:55 AM EST RP Workstation: [id number]   DG Chest Port 1 View Result Date: 04/19/2024 EXAM: 1 VIEW(S) XRAY OF THE CHEST 04/19/2024 01:48:00 AM COMPARISON: None available. CLINICAL HISTORY: Pre-op. FINDINGS: LUNGS AND PLEURA: Low lung volumes. No focal pulmonary opacity. No pleural effusion. No pneumothorax. HEART AND MEDIASTINUM: Aortic atherosclerosis. No acute abnormality of the cardiac and mediastinal silhouettes. BONES AND SOFT TISSUES: Partially imaged lumbar fusion hardware. Degenerative changes of right shoulder with calcific tendinopathy. Left shoulder arthroplasty noted. IMPRESSION: 1. No acute cardiopulmonary abnormality detected. Electronically signed by: Norman Gatlin MD 04/19/2024 01:55 AM EST RP Workstation: [id number]   DG Thoracic Spine 2  View Result Date: 04/19/2024 EXAM: 2 VIEW(S) XRAY OF THE THORACIC SPINE 04/19/2024 01:48:00 AM COMPARISON: None available. CLINICAL HISTORY: pain/contusion after fall FINDINGS: BONES: Demineralization. No acute fracture. No aggressive appearing osseous lesion. Alignment is normal. DISCS AND DEGENERATIVE CHANGES: Multilevel age commensurate spondylosis. SOFT TISSUES: The visualized lungs are clear. IMPRESSION: 1. No acute abnormality of the thoracic spine. Electronically signed by: Norman Gatlin MD 04/19/2024 01:55 AM EST RP Workstation: [id number]   DG Hip Unilat W or Wo Pelvis 2-3 Views Left Result Date: 04/19/2024 EXAM: 2 OR MORE VIEW(S) XRAY OF THE PELVIS AND LEFT HIP 04/19/2024 01:48:00 AM COMPARISON: None available. CLINICAL HISTORY: pain/contusion after fall FINDINGS: BONES AND JOINTS: SI joints are symmetric. No acute fracture. Bilateral hips demonstrate normal alignment. Mild degenerative changes of bilateral hip joints characterized by mild joint space narrowing and osteophytosis of the superior acetabulum. LUMBAR SPINE: Partially seen lumbar spine fusion hardware. SOFT TISSUES: Vascular calcifications noted. IMPRESSION: 1. No acute abnormality of the left hip. Electronically signed by: Norman Gatlin MD 04/19/2024 01:53 AM EST RP Workstation: [id number]   DG Knee Complete 4 Views Left Result Date: 04/19/2024 EXAM:  4 VIEW(S) XRAY OF THE LEFT KNEE 04/19/2024 01:48:00 AM COMPARISON: None available. CLINICAL HISTORY: [id number] Injury [id number] FINDINGS: BONES AND JOINTS: Total knee arthroplasty in place. Displaced periprosthetic fracture of proximal anterior tibia. No joint dislocation. No significant joint effusion. SOFT TISSUES: Subcutaneous soft tissue edema. Vascular calcifications. IMPRESSION: 1. Displaced periprosthetic fracture of proximal anterior tibia. Electronically signed by: Norman Gatlin MD 04/19/2024 01:52 AM EST RP Workstation: [id number]    Review of Systems  Constitutional:   Negative for chills and fever.  Respiratory:  Negative for cough and shortness of breath.   Cardiovascular:  Negative for chest pain.  Gastrointestinal:  Negative for abdominal pain.  Musculoskeletal:  Positive for arthralgias, gait problem and joint swelling.  Neurological:  Negative for dizziness and weakness.  Psychiatric/Behavioral:  Negative for behavioral problems.    Blood pressure 128/67, pulse 74, temperature 99 F (37.2 C), temperature source Oral, resp. rate 19, height 5' 9 (1.753 m), weight 86.5 kg, SpO2 98%. Physical Exam Constitutional:      General: She is not in acute distress.    Appearance: Normal appearance. She is not ill-appearing.  HENT:     Head: Normocephalic and atraumatic.  Eyes:     Extraocular Movements: Extraocular movements intact.  Cardiovascular:     Rate and Rhythm: Normal rate.     Pulses: Normal pulses.  Pulmonary:     Effort: Pulmonary effort is normal. No respiratory distress.  Musculoskeletal:     Cervical back: Normal range of motion.     Comments: Left knee diffusely tender to palpation, mostly over the tibial tubercle. Incision from previous TKA healed with no signs of infection. Hematoma about the left knee with moderate soft tissue swelling in the left LE. Calves soft and nontender. Dorsiflexion and plantar flexion intact bilaterally. Distally neurovascularly intact.   Skin:    Capillary Refill: Capillary refill takes less than 2 seconds.  Neurological:     General: No focal deficit present.     Mental Status: She is alert and oriented to person, place, and time. Mental status is at baseline.  Psychiatric:        Behavior: Behavior normal.     Assessment/Plan: Left total knee periprosthetic fracture Periprosthetic fracture about the tibial hardware involving the anterior proximal tibial metaphysis, with the largest anterior fragment displaced a few millimeters cephalad with slight upward rotation. Patient will likely require surgical  intervention for this issue but informed her that we are making arrangement for total knee specialist to see her and determine best plan. It appears that she requires blood transfusion today. Medicine has arranged. Will make patient NPO after midnight in case surgical management can be achieved tomorrow. NWB, remain in knee immobilizer.  Morgan Keinath L. [place]field, PA-C 04/20/2024, 8:56 AM

## 2024-04-20 NOTE — Progress Notes (Signed)
 Overnight cross coverage  Informed by RN regarding critical hemoglobin.  Hemoglobin 6.9 on morning labs today and was previously 10.9 yesterday.  No overt bleeding and patient is hemodynamically stable.  She was receiving subcutaneous heparin for DVT prophylaxis which has been discontinued.  Type and screen, 1 unit PRBCs ordered after obtaining consent from the patient.  Follow-up posttransfusion H&H and continue to transfuse if hemoglobin is less than 7.

## 2024-04-21 ENCOUNTER — Encounter (HOSPITAL_COMMUNITY)
Admission: EM | Disposition: A | Discharge status: Skilled Nursing Facility | Payer: Self-pay | Source: Home / Self Care | Attending: Internal Medicine

## 2024-04-21 ENCOUNTER — Inpatient Hospital Stay (HOSPITAL_COMMUNITY)

## 2024-04-21 ENCOUNTER — Inpatient Hospital Stay (HOSPITAL_COMMUNITY): Admitting: Certified Registered Nurse Anesthetist

## 2024-04-21 ENCOUNTER — Encounter (HOSPITAL_COMMUNITY): Payer: Self-pay | Admitting: Anesthesiology

## 2024-04-21 ENCOUNTER — Encounter (HOSPITAL_COMMUNITY): Payer: Self-pay | Admitting: Internal Medicine

## 2024-04-21 DIAGNOSIS — Z471 Aftercare following joint replacement surgery: Secondary | ICD-10-CM | POA: Diagnosis not present

## 2024-04-21 DIAGNOSIS — I10 Essential (primary) hypertension: Secondary | ICD-10-CM | POA: Diagnosis not present

## 2024-04-21 DIAGNOSIS — S82152A Displaced fracture of left tibial tuberosity, initial encounter for closed fracture: Secondary | ICD-10-CM

## 2024-04-21 DIAGNOSIS — W19XXXA Unspecified fall, initial encounter: Secondary | ICD-10-CM | POA: Diagnosis not present

## 2024-04-21 DIAGNOSIS — Z96652 Presence of left artificial knee joint: Secondary | ICD-10-CM | POA: Diagnosis not present

## 2024-04-21 DIAGNOSIS — Y92009 Unspecified place in unspecified non-institutional (private) residence as the place of occurrence of the external cause: Secondary | ICD-10-CM | POA: Diagnosis not present

## 2024-04-21 HISTORY — PX: OPEN REDUCTION INTERNAL FIXATION (ORIF) TIBIA/FIBULA FRACTURE: SHX5992

## 2024-04-21 LAB — CBC WITH DIFFERENTIAL/PLATELET
Abs Immature Granulocytes: 0.02 K/uL (ref 0.00–0.07)
Basophils Absolute: 0 K/uL (ref 0.0–0.1)
Basophils Relative: 0 %
Eosinophils Absolute: 0.1 K/uL (ref 0.0–0.5)
Eosinophils Relative: 2 %
HCT: 24.1 % — ABNORMAL LOW (ref 36.0–46.0)
Hemoglobin: 8 g/dL — ABNORMAL LOW (ref 12.0–15.0)
Immature Granulocytes: 0 %
Lymphocytes Relative: 29 %
Lymphs Abs: 1.9 K/uL (ref 0.7–4.0)
MCH: 31.7 pg (ref 26.0–34.0)
MCHC: 33.2 g/dL (ref 30.0–36.0)
MCV: 95.6 fL (ref 80.0–100.0)
Monocytes Absolute: 0.8 K/uL (ref 0.1–1.0)
Monocytes Relative: 12 %
Neutro Abs: 3.8 K/uL (ref 1.7–7.7)
Neutrophils Relative %: 57 %
Platelets: 120 K/uL — ABNORMAL LOW (ref 150–400)
RBC: 2.52 MIL/uL — ABNORMAL LOW (ref 3.87–5.11)
RDW: 14.1 % (ref 11.5–15.5)
WBC: 6.7 K/uL (ref 4.0–10.5)
nRBC: 0 % (ref 0.0–0.2)

## 2024-04-21 LAB — COMPREHENSIVE METABOLIC PANEL WITH GFR
ALT: 19 U/L (ref 0–44)
AST: 31 U/L (ref 15–41)
Albumin: 2.4 g/dL — ABNORMAL LOW (ref 3.5–5.0)
Alkaline Phosphatase: 40 U/L (ref 38–126)
Anion gap: 10 (ref 5–15)
BUN: 10 mg/dL (ref 8–23)
CO2: 23 mmol/L (ref 22–32)
Calcium: 8.3 mg/dL — ABNORMAL LOW (ref 8.9–10.3)
Chloride: 102 mmol/L (ref 98–111)
Creatinine, Ser: 0.82 mg/dL (ref 0.44–1.00)
GFR, Estimated: >60 mL/min (ref >60)
Glucose, Bld: 114 mg/dL — ABNORMAL HIGH (ref 70–99)
Potassium: 4.6 mmol/L (ref 3.5–5.1)
Sodium: 135 mmol/L (ref 135–145)
Total Bilirubin: 0.9 mg/dL (ref 0.0–1.2)
Total Protein: 4.8 g/dL — ABNORMAL LOW (ref 6.5–8.1)

## 2024-04-21 LAB — HEMOGLOBIN A1C
Hgb A1c MFr Bld: 5.8 % — ABNORMAL HIGH (ref 4.8–5.6)
Mean Plasma Glucose: 120 mg/dL

## 2024-04-21 LAB — TYPE AND SCREEN
ABO/RH(D): A POS
Antibody Screen: NEGATIVE

## 2024-04-21 LAB — PREPARE RBC (CROSSMATCH)

## 2024-04-21 SURGERY — OPEN REDUCTION INTERNAL FIXATION (ORIF) TIBIAL PLATEAU
Anesthesia: General | Laterality: Left

## 2024-04-21 SURGERY — OPEN REDUCTION INTERNAL FIXATION (ORIF) TIBIA/FIBULA FRACTURE
Anesthesia: General | Site: Knee | Laterality: Left

## 2024-04-21 MED ORDER — TRANEXAMIC ACID-NACL 1000-0.7 MG/100ML-% IV SOLN
INTRAVENOUS | Status: DC | PRN
  Administered 2024-04-21: 1000 mg via INTRAVENOUS

## 2024-04-21 MED ORDER — FENTANYL CITRATE (PF) 50 MCG/ML IJ SOSY: 25.0000 ug | PREFILLED_SYRINGE | INTRAMUSCULAR | Status: DC | PRN

## 2024-04-21 MED ORDER — CEFADROXIL 500 MG PO CAPS: 500.0000 mg | ORAL_CAPSULE | Freq: Two times a day (BID) | ORAL | 0 refills | Status: AC

## 2024-04-21 MED ORDER — CEFAZOLIN SODIUM-DEXTROSE 2-4 GM/100ML-% IV SOLN
INTRAVENOUS | Status: AC
  Filled 2024-04-21: qty 100

## 2024-04-21 MED ORDER — PROPOFOL 500 MG/50ML IV EMUL
INTRAVENOUS | Status: DC | PRN
  Administered 2024-04-21: 50 ug/kg/min via INTRAVENOUS

## 2024-04-21 MED ORDER — ACETAMINOPHEN 500 MG PO TABS: 1000.0000 mg | ORAL_TABLET | Freq: Three times a day (TID) | ORAL | Status: AC | PRN

## 2024-04-21 MED ORDER — PROPOFOL 10 MG/ML IV BOLUS
INTRAVENOUS | Status: DC | PRN
  Administered 2024-04-21: 130 mg via INTRAVENOUS

## 2024-04-21 MED ORDER — FENTANYL CITRATE (PF) 100 MCG/2ML IJ SOLN
INTRAMUSCULAR | Status: DC | PRN
  Administered 2024-04-21 (×2): 50 ug via INTRAVENOUS

## 2024-04-21 MED ORDER — ONDANSETRON 4 MG PO TBDP: 4.0000 mg | ORAL_TABLET | Freq: Three times a day (TID) | ORAL | Status: DC | PRN

## 2024-04-21 MED ORDER — CEFAZOLIN SODIUM-DEXTROSE 2-4 GM/100ML-% IV SOLN
2.0000 g | Freq: Three times a day (TID) | INTRAVENOUS | Status: AC
  Administered 2024-04-21 – 2024-04-24 (×9): 2 g via INTRAVENOUS
  Filled 2024-04-21 (×9): qty 100

## 2024-04-21 MED ORDER — ACETAMINOPHEN 325 MG PO TABS
650.0000 mg | ORAL_TABLET | Freq: Once | ORAL | Status: AC
  Administered 2024-04-21: 650 mg via ORAL
  Filled 2024-04-21: qty 2

## 2024-04-21 MED ORDER — CEFAZOLIN SODIUM-DEXTROSE 2-3 GM-%(50ML) IV SOLR
INTRAVENOUS | Status: DC | PRN
  Administered 2024-04-21: 2 g via INTRAVENOUS

## 2024-04-21 MED ORDER — ASPIRIN 81 MG PO TBEC
81.0000 mg | DELAYED_RELEASE_TABLET | Freq: Two times a day (BID) | ORAL | Status: DC
  Administered 2024-04-22 – 2024-04-26 (×9): 81 mg via ORAL
  Filled 2024-04-21 (×9): qty 1

## 2024-04-21 MED ORDER — OXYCODONE HCL 5 MG/5ML PO SOLN: 5.0000 mg | Freq: Once | ORAL | Status: DC | PRN

## 2024-04-21 MED ORDER — HYDROMORPHONE HCL 2 MG/ML IJ SOLN
INTRAMUSCULAR | Status: AC
  Filled 2024-04-21: qty 1

## 2024-04-21 MED ORDER — SODIUM CHLORIDE 0.9% FLUSH
10.0000 mL | Freq: Two times a day (BID) | INTRAVENOUS | Status: DC
  Administered 2024-04-21 – 2024-04-26 (×6): 10 mL

## 2024-04-21 MED ORDER — ADULT MULTIVITAMIN W/MINERALS CH
1.0000 | ORAL_TABLET | Freq: Every day | ORAL | Status: DC
  Administered 2024-04-22 – 2024-04-26 (×5): 1 via ORAL
  Filled 2024-04-21 (×5): qty 1

## 2024-04-21 MED ORDER — SODIUM CHLORIDE 0.9% IV SOLUTION: Freq: Once | INTRAVENOUS | Status: AC

## 2024-04-21 MED ORDER — HYDROCODONE-ACETAMINOPHEN 5-325 MG PO TABS
1.0000 | ORAL_TABLET | ORAL | Status: DC | PRN
  Administered 2024-04-22 – 2024-04-25 (×11): 1 via ORAL
  Filled 2024-04-21 (×12): qty 1

## 2024-04-21 MED ORDER — LACTATED RINGERS IV SOLN: INTRAVENOUS | Status: AC

## 2024-04-21 MED ORDER — BUPIVACAINE-EPINEPHRINE (PF) 0.5% -1:200000 IJ SOLN
INTRAMUSCULAR | Status: DC | PRN
  Administered 2024-04-21: 30 mL via PERINEURAL

## 2024-04-21 MED ORDER — ONDANSETRON HCL 4 MG/2ML IJ SOLN
INTRAMUSCULAR | Status: DC | PRN
  Administered 2024-04-21: 4 mg via INTRAVENOUS

## 2024-04-21 MED ORDER — EPHEDRINE 5 MG/ML INJ
INTRAVENOUS | Status: AC
  Filled 2024-04-21: qty 5

## 2024-04-21 MED ORDER — ONDANSETRON HCL 4 MG/2ML IJ SOLN
4.0000 mg | Freq: Four times a day (QID) | INTRAMUSCULAR | Status: DC
  Administered 2024-04-21: 4 mg via INTRAVENOUS
  Filled 2024-04-21: qty 2

## 2024-04-21 MED ORDER — ONDANSETRON HCL 4 MG/2ML IJ SOLN
INTRAMUSCULAR | Status: AC
  Filled 2024-04-21: qty 2

## 2024-04-21 MED ORDER — PHENYLEPHRINE HCL (PRESSORS) 10 MG/ML IV SOLN
INTRAVENOUS | Status: DC | PRN
  Administered 2024-04-21: 80 ug via INTRAVENOUS

## 2024-04-21 MED ORDER — ONDANSETRON HCL 4 MG/2ML IJ SOLN: 4.0000 mg | Freq: Once | INTRAMUSCULAR | Status: DC | PRN

## 2024-04-21 MED ORDER — ONDANSETRON HCL 4 MG PO TABS: 4.0000 mg | ORAL_TABLET | Freq: Three times a day (TID) | ORAL | 0 refills | Status: AC | PRN

## 2024-04-21 MED ORDER — HYDROMORPHONE HCL 1 MG/ML IJ SOLN
INTRAMUSCULAR | Status: DC | PRN
  Administered 2024-04-21 (×2): .5 mg via INTRAVENOUS

## 2024-04-21 MED ORDER — HYDROMORPHONE HCL 1 MG/ML IJ SOLN
0.5000 mg | INTRAMUSCULAR | Status: DC | PRN
  Administered 2024-04-24: 1 mg via INTRAVENOUS
  Administered 2024-04-24: 0.5 mg via INTRAVENOUS
  Administered 2024-04-25: 1 mg via INTRAVENOUS
  Filled 2024-04-21 (×3): qty 1

## 2024-04-21 MED ORDER — METHOCARBAMOL 500 MG PO TABS: 500.0000 mg | ORAL_TABLET | Freq: Three times a day (TID) | ORAL | 0 refills | Status: AC | PRN

## 2024-04-21 MED ORDER — SODIUM CHLORIDE 0.9% FLUSH
10.0000 mL | INTRAVENOUS | Status: DC | PRN
  Administered 2024-04-22: 10 mL

## 2024-04-21 MED ORDER — FUROSEMIDE 10 MG/ML IJ SOLN
20.0000 mg | Freq: Once | INTRAMUSCULAR | Status: AC
  Administered 2024-04-21: 20 mg via INTRAVENOUS
  Filled 2024-04-21: qty 2

## 2024-04-21 MED ORDER — CEFADROXIL 500 MG PO CAPS
500.0000 mg | ORAL_CAPSULE | Freq: Two times a day (BID) | ORAL | Status: DC
  Administered 2024-04-24 – 2024-04-26 (×4): 500 mg via ORAL
  Filled 2024-04-21 (×4): qty 1

## 2024-04-21 MED ORDER — 0.9 % SODIUM CHLORIDE (POUR BTL) OPTIME
TOPICAL | Status: DC | PRN
  Administered 2024-04-21: 1000 mL

## 2024-04-21 MED ORDER — OXYCODONE HCL 5 MG PO TABS: 5.0000 mg | ORAL_TABLET | ORAL | 0 refills | Status: AC | PRN

## 2024-04-21 MED ORDER — ASPIRIN 81 MG PO TBEC: 81.0000 mg | DELAYED_RELEASE_TABLET | Freq: Two times a day (BID) | ORAL | Status: AC

## 2024-04-21 MED ORDER — OXYCODONE HCL 5 MG PO TABS: 5.0000 mg | ORAL_TABLET | Freq: Once | ORAL | Status: DC | PRN

## 2024-04-21 MED ORDER — TRANEXAMIC ACID-NACL 1000-0.7 MG/100ML-% IV SOLN
INTRAVENOUS | Status: AC
  Filled 2024-04-21: qty 100

## 2024-04-21 MED ORDER — FENTANYL CITRATE (PF) 50 MCG/ML IJ SOSY
50.0000 ug | PREFILLED_SYRINGE | INTRAMUSCULAR | Status: DC
  Filled 2024-04-21: qty 2

## 2024-04-21 MED ORDER — FENTANYL CITRATE (PF) 100 MCG/2ML IJ SOLN
INTRAMUSCULAR | Status: AC
  Filled 2024-04-21: qty 2

## 2024-04-21 MED ORDER — POLYETHYLENE GLYCOL 3350 17 G PO PACK: 17.0000 g | PACK | Freq: Every day | ORAL | Status: AC

## 2024-04-21 MED ORDER — SODIUM CHLORIDE 0.9 % IR SOLN
Status: DC | PRN
  Administered 2024-04-21: 1000 mL

## 2024-04-21 MED ORDER — PHENYLEPHRINE HCL-NACL 20-0.9 MG/250ML-% IV SOLN
INTRAVENOUS | Status: DC | PRN
  Administered 2024-04-21: 50 ug/min via INTRAVENOUS

## 2024-04-21 SURGICAL SUPPLY — 64 items
ANCHOR SUT JK SZ 2 2.9 DBL SL (Anchor) IMPLANT
BANDAGE ESMARK 6X9 LF (GAUZE/BANDAGES/DRESSINGS) ×1 IMPLANT
BIT DRILL JUGRKNT W/NDL BIT2.9 (DRILL) IMPLANT
BIT DRILL SHORT 2.0 ZI (BIT) IMPLANT
BLADE SURG 15 STRL LF DISP TIS (BLADE) ×3 IMPLANT
BNDG COHESIVE 4X5 TAN STRL LF (GAUZE/BANDAGES/DRESSINGS) ×1 IMPLANT
BNDG ELASTIC 4INX 5YD STR LF (GAUZE/BANDAGES/DRESSINGS) ×1 IMPLANT
BNDG ELASTIC 6INX 5YD STR LF (GAUZE/BANDAGES/DRESSINGS) ×1 IMPLANT
BNDG ELASTIC 6X10 VLCR STRL LF (GAUZE/BANDAGES/DRESSINGS) IMPLANT
CLSR STERI-STRIP ANTIMIC 1/2X4 (GAUZE/BANDAGES/DRESSINGS) ×1 IMPLANT
COVER BACK TABLE 60X90IN (DRAPES) ×1 IMPLANT
CUFF TRNQT CYL 34X4.125X (TOURNIQUET CUFF) IMPLANT
DERMABOND ADVANCED .7 DNX12 (GAUZE/BANDAGES/DRESSINGS) IMPLANT
DRAPE EXTREMITY T 121X128X90 (DISPOSABLE) ×1 IMPLANT
DRAPE IMP U-DRAPE 54X76 (DRAPES) ×1 IMPLANT
DRAPE INCISE IOBAN 66X45 STRL (DRAPES) IMPLANT
DRAPE OEC MINIVIEW 54X84 (DRAPES) ×1 IMPLANT
DRAPE U-SHAPE 47X51 STRL (DRAPES) ×1 IMPLANT
DRESSING AQUACEL AG SP 3.5X10 (GAUZE/BANDAGES/DRESSINGS) IMPLANT
DRIVER T15 RETENTION (MISCELLANEOUS) IMPLANT
DURAPREP 26ML APPLICATOR (WOUND CARE) ×1 IMPLANT
ELECT REM PT RETURN 15FT ADLT (MISCELLANEOUS) ×1 IMPLANT
GAUZE PAD ABD 8X10 STRL (GAUZE/BANDAGES/DRESSINGS) ×1 IMPLANT
GAUZE SPONGE 4X4 12PLY STRL (GAUZE/BANDAGES/DRESSINGS) ×1 IMPLANT
GLOVE BIOGEL PI IND STRL 8 (GLOVE) ×2 IMPLANT
GLOVE SURG ORTHO 8.0 STRL STRW (GLOVE) ×1 IMPLANT
GOWN STRL REUS W/ TWL LRG LVL3 (GOWN DISPOSABLE) ×1 IMPLANT
GOWN STRL REUS W/ TWL XL LVL3 (GOWN DISPOSABLE) ×1 IMPLANT
KIT BASIN OR (CUSTOM PROCEDURE TRAY) ×1 IMPLANT
KIT TURNOVER KIT A (KITS) ×1 IMPLANT
KWIRE ALPS MXV 1.6X6 ZI (WIRE) IMPLANT
KWIRE OLIVE PLT TACK 2.0-4 SU (WIRE) IMPLANT
NDL HYPO 25X1 1.5 SAFETY (NEEDLE) IMPLANT
NS IRRIG 1000ML POUR BTL (IV SOLUTION) ×1 IMPLANT
PAD CAST 4YDX4 CTTN HI CHSV (CAST SUPPLIES) ×2 IMPLANT
PADDING CAST COTTON 6X4 STRL (CAST SUPPLIES) IMPLANT
PENCIL SMOKE EVACUATOR (MISCELLANEOUS) ×1 IMPLANT
PLATE LOCK MDS 2.7X26 (Plate) IMPLANT
PLATE TRIANGLE 2.7 4H T 7H (Plate) IMPLANT
SCREW LOCK MDS 2.7X18 (Screw) IMPLANT
SCREW LOCK MDS 2.7X30 (Screw) IMPLANT
SCREW NL 2.7X30 (Screw) IMPLANT
SCREW NLOCK 2.7X32 (Screw) IMPLANT
SCREW NLOCK ALPS MVX 2.7X15 (Screw) IMPLANT
SHEET MEDIUM DRAPE 40X70 STRL (DRAPES) ×1 IMPLANT
SPIKE FLUID TRANSFER (MISCELLANEOUS) IMPLANT
SPLINT PLASTER CAST XFAST 5X30 (CAST SUPPLIES) IMPLANT
SPONGE T-LAP 4X18 ~~LOC~~+RFID (SPONGE) ×1 IMPLANT
STAPLER SKIN PROX 35W (STAPLE) IMPLANT
STOCKINETTE 6 STRL (DRAPES) ×1 IMPLANT
SUCTION TUBE FRAZIER 10FR DISP (SUCTIONS) ×1 IMPLANT
SUT ETHILON 3 0 PS 1 (SUTURE) IMPLANT
SUT ETHILON 4 0 PS 2 18 (SUTURE) IMPLANT
SUT MNCRL AB 4-0 PS2 18 (SUTURE) IMPLANT
SUT VIC AB 0 CT1 27XBRD ANBCTR (SUTURE) IMPLANT
SUT VIC AB 2-0 SH 18 (SUTURE) IMPLANT
SUT VIC AB 3-0 SH 27X BRD (SUTURE) IMPLANT
SUT VIC AB 4-0 PS2 18 (SUTURE) ×2 IMPLANT
SUT VICRYL 3-0 CR8 SH (SUTURE) ×1 IMPLANT
SYR BULB EAR ULCER 3OZ GRN STR (SYRINGE) ×1 IMPLANT
SYR CONTROL 10ML LL (SYRINGE) IMPLANT
TUBING CONNECTING 10 (TUBING) ×1 IMPLANT
UNDERPAD 30X36 HEAVY ABSORB (UNDERPADS AND DIAPERS) ×1 IMPLANT
YANKAUER SUCT BULB TIP NO VENT (SUCTIONS) IMPLANT

## 2024-04-21 NOTE — Anesthesia Procedure Notes (Signed)
 Anesthesia Regional Block: Femoral nerve block   Pre-Anesthetic Checklist: , timeout performed,  Correct Patient, Correct Site, Correct Laterality,  Correct Procedure, Correct Position, site marked,  Risks and benefits discussed,  Surgical consent,  Pre-op evaluation,  At surgeon's request and post-op pain management  Laterality: Left  Prep: Maximum Sterile Barrier Precautions used, chloraprep       Needles:  Injection technique: Single-shot  Needle Type: Echogenic Needle      Needle Gauge: 20     Additional Needles:   Procedures:,,,, ultrasound used (permanent image in chart),,    Narrative:  Start time: 04/21/2024 3:00 PM End time: 04/21/2024 3:05 PM Injection made incrementally with aspirations every 5 mL.  Performed by: Personally  Anesthesiologist: Keneth Lynwood POUR, MD

## 2024-04-21 NOTE — Discharge Instructions (Addendum)
 Orthopedic Discharge Instructions  Diet: As you were doing prior to hospitalization   Shower:  KEEP THE BRACE ON AT ALL TIMES.  May shower but keep the brace/wounds dry, use an occlusive plastic wrap, some examples are available at places such as [employer]. NO SOAKING IN TUB.  If the bandage gets wet, call the office for further instruction.  Dressing:  Since  you have a locked knee brace in place, keep the bandages in place.  You have stitches that will dissolve on their own, and this is covered in a bandage.  This bandage is to be left in place until your two week follow up in the office.    Activity/Weight Bearing:  You may bear weight on your surgical leg as tolerated with the leg locked straight, however KEEP THE LOCKED KNEE BRACE IN PLACE AT ALL TIMES -- DO NOT BEND YOUR KNEE.  This will be for a minimum of 6 weeks, likely longer.  Do not drive for this amount of time as well.  In addition, you cannot be taking narcotics while you drive, and you must feel in control of the vehicle.    Blood clot prevention (DVT Prophylaxis): After surgery you are at an increased risk for a blood clot. you were prescribed a blood thinner, Aspirin  81mg , to be taken twice daily for a total of 4 weeks from surgery to help reduce your risk of getting a blood clot. Signs of a pulmonary embolus (blood clot in the lungs) include sudden short of breath, feeling lightheaded or dizzy, chest pain with a deep breath, rapid pulse rapid breathing.  Signs of a blood clot in your arms or legs include new unexplained swelling and cramping, warm, red or darkened skin around the painful area.  Please call the office or 911 right away if these signs or symptoms develop.  To prevent constipation: you may use a stool softener such as - Colace (over the counter) 100 mg by mouth twice a day  Drink plenty of fluids (prune juice may be helpful) and high fiber foods Miralax (over the counter) for constipation as needed.    Itching:  If you  experience itching with your medications, try taking only a single pain pill, or even half a pain pill at a time.  You may take up to 10 pain pills per day, and you can also use benadryl over the counter for itching or also to help with sleep.   Precautions:  If you experience chest pain or shortness of breath - call 911 immediately for transfer to the hospital emergency department!!  Call office [phone]) for the following: Temperature greater than 101F Persistent nausea and vomiting Severe uncontrolled pain Redness, tenderness, or signs of infection (pain, swelling, redness, odor or green/yellow discharge around the site) Difficulty breathing, headache or visual disturbances Hives Persistent dizziness or light-headedness Extreme fatigue Any other questions or concerns you may have after discharge  In an emergency, call 911 or go to an Emergency Department at a nearby hospital  Follow- Up Appointment:  Please call for an appointment to be seen approximately 2-3 week after surgery in [facility] with your surgeon Dr. Toribio Higashi - [phone] Address: [address], [place], [place] [address]      High-Calorie, High-Protein Nutrition Therapy (2021) A high-calorie, high-protein diet has been recommended to you. Your registered dietitian nutritionist (RDN) may have recommended this diet because you are having difficulty eating enough calories throughout the day, you have lost weight, and/or you  need to add protein to your diet. Sometimes you may not feel like eating, even if you know the importance of good nutrition. The recommendations in this handout can help you with the following: Regaining your strength and energy Keeping your body healthy Healing and recovering from surgery or illness and fighting infection Tips: Schedule Your Meals and Snacks Several small meals and snacks are often better tolerated and digested than large meals. Strategies Plan to eat 3  meals and 3 snacks daily. Experiment with timing meals to find out when you have a larger appetite. Appetite may be greatest in the morning after not eating all night so you may prefer to eat your larger meals and snacks in the morning and at lunch. Breakfast-type foods are often better tolerated so eat foods such as eggs, pancakes, waffles and cereal for any meal or snack. Carry snacks with you so you are prepared to eat every 2 to 3 hours. Determine what works best for you if your body's cues for feeling hungry or full are not working. Eat a small meal or snack even if you don't feel hungry. Set a timer to remind you when it is time to eat. Take a walk before you eat (with health care provider's approval). Light or moderate physical activity can help you maintain muscle and increase your appetite. Make Eating Enjoyable Taking steps to make the experience enjoyable may help to increase your interest in eating and improve your appetite. Strategies: Eat with others whenever possible. Include your favorite foods to make meals more enjoyable. Try new foods. Save your beverage for the end of the meal so that you have more room for food before you get full. Add Calories to Your Meals and Snacks Try adding calorie-dense foods so that each bite provides more nutrition. Strategies Drink milk, chocolate milk, soy milk, or smoothies instead of low-calorie beverages such as diet drinks or water. Cook with milk or soy milk instead of water when making dishes such as hot cereal, cocoa, or pudding. Add jelly, jam, honey, butter or margarine to bread and crackers. Add jam or fruit to ice cream and as a topping over cake. Mix dried fruit, nuts, granola, honey, or dry cereal with yogurt or hot cereals. Enjoy snacks such as milkshakes, smoothies, pudding, ice cream, or custard. Blend a fruit smoothie of a banana, frozen berries, milk or soy milk, and 1 tablespoon nonfat powdered milk or protein powder. Add  Protein to Your Meals and Snacks Choose at least one protein food at each meal and snack to increase your daily intake. Strategies Add  cup nonfat dry milk powder or protein powder to make a high-protein milk to drink or to use in recipes that call for milk. Vanilla or peppermint extract or unsweetened cocoa powder could help to boost the flavor. Add hard-cooked eggs, leftover meat, grated cheese, canned beans or tofu to noodles, rice, salads, sandwiches, soups, casseroles, pasta, tuna and other mixed dishes. Add powdered milk or protein powder to hot cereals, meatloaf, casseroles, scrambled eggs, sauces, cream soups, and shakes. Add beans and lentils to salads, soups, casseroles, and vegetable dishes. Eat cottage cheese or yogurt, especially Greek yogurt, with fruit as a snack or dessert. Eat peanut or other nut butters on crackers, bread, toast, waffles, apples, bananas or celery sticks. Add it to milkshakes, smoothies, or desserts. Consider a ready-made protein shake. Your RDN will make recommendations. Add Fats to Your Meals and Snacks Try adding fats to your meals and snacks. Fat provides  more calories in fewer bites than carbohydrate or protein and adds flavors to your foods. Strategies Snack on nuts and seeds or add them to foods like salads, pasta, cereals, yogurt, and ice cream.  Saut or stir-fry vegetables, meats, chicken, fish or tofu in olive or canola oil.  Add olive oil, other vegetable oils, butter or margarine to soups, vegetables, potatoes, cooked cereal, rice, pasta, bread, crackers, pancakes, or waffles. Snack on olives or add to pasta, pizza, or salad. Add avocado or guacamole to your salads, sandwiches, and other entrees. Include fatty fish such as salmon in your weekly meal plan. For general food safety tips, especially for clients with immunocompromised conditions, ask your RDN for the Food Safety Nutrition Therapy handout. Small Meal and Snack Ideas These snacks and  meals are recommended when you have to eat but aren't necessarily hungry.  They are good choices because they are high in protein and high in calories.  2 graham crackers 2 tablespoons peanut or other nut butter 1 cup milk 2 slices whole wheat toast topped with:  avocado, mashed Seasoning of your choice   cup Greek yogurt  cup fruit  cup granola 2 deviled egg halves 5 whole wheat crackers  1 cup cream of tomato soup  grilled cheese sandwich 1 toasted waffle topped with: 2 tablespoons peanut or nut butter 1 tablespoon jam  Trail mix made with:  cup nuts  cup dried fruit  cup cold cereal, any variety  cup oatmeal or cream of wheat cereal 1 tablespoon peanut or nut butter  cup diced fruit   High-Calorie, High-Protein Sample 1-Day Menu View Nutrient Info Breakfast 1 egg, scrambled 1 ounce cheddar cheese 1 English muffin, whole wheat 1 tablespoon margarine 1 tablespoon jam  cup orange juice, fortified with calcium and vitamin D  Morning Snack 1 tablespoon peanut butter 1 banana 1 cup 1% milk  Lunch Tuna salad sandwich made with: 2 slices bread, whole wheat 3 ounces tuna mixed with: 1 tablespoon mayonnaise  cup pudding  Afternoon Snack  cup hummus  cup carrots 1 pita  Evening Meal Enchilada casserole made with: 2 corn tortillas 3 ounces ground beef, cooked  cup black beans, cooked  cup corn, cooked 1 ounce grated cheddar cheese  cup enchilada sauce  avocado, sliced, topping for enchilada 1 tablespoon sour cream, topping for enchilada Salad:  cup lettuce, shredded  cup tomatoes, chopped, for salad 1 tablespoon olive oil and vinegar dressing, for salad  Evening Snack  cup Greek yogurt  cup blueberries  cup granola

## 2024-04-21 NOTE — TOC Initial Note (Signed)
 Transition of Care [facility]) - Initial/Assessment Note    Patient Details  Name: [name] MRN: [id number] Date of Birth: April 26, 1936  Transition of Care [facility]) CM/SW Contact:    Landry DELENA Senters, RN Phone Number: 04/21/2024, 9:45 AM  Clinical Narrative:                 RR:ejdu medical history  of  essential HTN, osteoporosis coming for fall and left knee surgery.   Patient was brought last night for a fall when she was climbing a stepstool and missed her step with no trauma reported to her head however per chart review and RN note EMS noted a syncopal episode when they were with her for about a minute after which patient returned back to her baseline.  Patient fell on her left knee which she has a history of surgery on in the past  Patient lives at home alone. She does report having support from neighbors, friends, and then has 2 children that live in [place] . Patient will have transport home from her friend or children at d/c, depending on date of d/c.   Patient has PCP, manages own medications. Patient does report she does not want to pursue any inpatient rehab, but is open to attending OP rehab or have HH therapy. Patient does have a preference for [facility] therapy, as she has had this in the past, but cannot remember the company name. She will try to find the name of [facility] agency for f/u if [facility] is ordered.   Plan for patient to transfer to Darryle Law at [facility] today for surgery.  CM to continue to follow.   Expected Discharge Plan:  (TBD) Barriers to Discharge: Continued Medical Work up   Patient Goals and CMS Choice            Expected Discharge Plan and Services       Living arrangements for the past 2 months: Single Family Home                                      Prior Living Arrangements/Services Living arrangements for the past 2 months: Single Family Home Lives with:: Self Patient language and need for interpreter reviewed:: Yes Do you feel safe going back  to the place where you live?: Yes      Need for Family Participation in Patient Care: Yes (Comment) Care giver support system in place?: Yes (comment) Current home services: DME (W/C, knee scooter, rolling walker, rollator, shower seat, BSC) Criminal Activity/Legal Involvement Pertinent to Current Situation/Hospitalization: No - Comment as needed  Activities of Daily Living   ADL Screening (condition at time of admission) Independently performs ADLs?: Yes (appropriate for developmental age) Is the patient deaf or have difficulty hearing?: Yes Does the patient have difficulty seeing, even when wearing glasses/contacts?: No Does the patient have difficulty concentrating, remembering, or making decisions?: No  Permission Sought/Granted                  Emotional Assessment Appearance:: Developmentally appropriate Attitude/Demeanor/Rapport: Engaged Affect (typically observed): Calm Orientation: : Oriented to Place, Oriented to Self, Oriented to  Time, Oriented to Situation Alcohol / Substance Use: Not Applicable Psych Involvement: No (comment)  Admission diagnosis:  Fx [T14.8XXA] Fall at home, initial encounter [phone].CHERENE, Y92.009] Patient Active Problem List   Diagnosis Date Noted   Fall at home, initial encounter 04/19/2024   Anemia 06/21/2023  GERD (gastroesophageal reflux disease) 06/21/2023   Heart valve disease 06/21/2023   History of cornea transplant 06/21/2023   Hyperlipidemia 06/21/2023   Osteoporosis, 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   History of left shoulder replacement 06/21/2023   Extraction of tooth needed 03/21/2023   Rotator cuff tear arthropathy, left 09/22/2021   H/O colonoscopy 09/19/2018   Incontinence of feces 07/18/2018   Arthritis of left ankle 06/23/2018   Posterior tibial tendon dysfunction (PTTD) of left lower extremity 04/10/2016   Chronic osteoarthritis 02/01/2016   HTN (hypertension), benign 02/01/2016   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 [place] [address] Phone: [phone] Fax: [phone]  [facility] [address] [place] - [address] [place] [address] Phone: [phone] Fax: [phone]     Social Drivers of Health (SDOH) Social History: SDOH Screenings   Food Insecurity: No Food Insecurity (04/19/2024)  Housing: Low Risk  (04/20/2024)  Transportation Needs: No Transportation Needs (04/20/2024)  Utilities: Not At Risk (04/19/2024)  Financial Resource Strain: Low Risk  (03/12/2024)   Received from [facility]  Social Connections: Moderately Integrated (04/20/2024)  Tobacco Use: Unknown (04/19/2024)   SDOH Interventions:     Readmission Risk Interventions     No data to display

## 2024-04-21 NOTE — Anesthesia Preprocedure Evaluation (Addendum)
 Anesthesia Evaluation  Patient identified by MRN, date of birth, ID band Patient awake    Reviewed: Allergy & Precautions, NPO status , Patient's Chart, lab work & pertinent test results, reviewed documented beta blocker date and time   History of Anesthesia Complications Negative for: history of anesthetic complications  Airway Mallampati: II  TM Distance: >3 FB     Dental no notable dental hx.    Pulmonary neg sleep apnea, neg COPD   breath sounds clear to auscultation       Cardiovascular hypertension, (-) CAD, (-) Past MI and (-) Cardiac Stents  Rhythm:Regular Rate:Normal  Study Conclusions   - Left ventricle: Wall thickness was increased in a pattern of    moderate LVH. Systolic function was normal. The estimated    ejection fraction was in the range of 60% to 65%. Doppler    parameters are consistent with abnormal left ventricular    relaxation (grade 1 diastolic dysfunction).  - Left atrium: The atrium was mildly dilated.  - Right atrium: The atrium was mildly dilated.     Neuro/Psych neg Seizures CVA, No Residual Symptoms    GI/Hepatic ,GERD  ,,(+) neg Cirrhosis        Endo/Other    Renal/GU Renal disease     Musculoskeletal  (+) Arthritis , Osteoarthritis,    Abdominal   Peds  Hematology  (+) Blood dyscrasia, anemia   Anesthesia Other Findings   Reproductive/Obstetrics                              Anesthesia Physical Anesthesia Plan  ASA: 2  Anesthesia Plan: General   Post-op Pain Management: Regional block*   Induction: Intravenous  PONV Risk Score and Plan: 2 and Ondansetron  and Dexamethasone   Airway Management Planned: LMA  Additional Equipment:   Intra-op Plan:   Post-operative Plan: Extubation in OR  Informed Consent: I have reviewed the patients History and Physical, chart, labs and discussed the procedure including the risks, benefits and  alternatives for the proposed anesthesia with the patient or authorized representative who has indicated his/her understanding and acceptance.     Dental advisory given  Plan Discussed with: CRNA  Anesthesia Plan Comments:         Anesthesia Quick Evaluation

## 2024-04-21 NOTE — Op Note (Signed)
 04/19/2024 - 04/21/2024  PATIENT:  [name]    PRE-OPERATIVE DIAGNOSIS: Left tibial tubercle fracture with disruption of extensor mechanism  POST-OPERATIVE DIAGNOSIS:  Same  PROCEDURE: Open reduction term fixation tibial fracture with extensor mechanism patella tendon repair   SURGEON:  Kalesha Irving A Joakim Huesman, MD  PHYSICIAN ASSISTANT: Bernarda Mclean, PA-C, present and scrubbed throughout the case, critical for completion in a timely fashion, and for retraction, instrumentation, and closure.  ANESTHESIA:   General  ESTIMATED BLOOD LOSS: 50cc  PREOPERATIVE INDICATIONS:  [name] is a  88 y.o. female with a diagnosis of Left tibial tubercle fracture with disruption of extensor mechanism who elected for surgical management to minimize the risk for malunion and nonunion and post-traumatic arthritis.    The risks benefits and alternatives were discussed with the patient preoperatively including but not limited to the risks of infection, bleeding, nerve injury, cardiopulmonary complications, the need for revision surgery, the need for hardware removal, among others, and the patient was willing to proceed.  OPERATIVE IMPLANTS:  4 hole by 7 hole mini frag T plate for 2.7 millimeter screws 2.9 mm double loaded suture anchor with tapes Implant Name Type Inv. Item Serial No. Manufacturer Lot No. LRB No. Used Action  ANCHOR SUT JK SZ 2 2.9 DBL SL - [id number] Anchor ANCHOR SUT JK SZ 2 2.9 DBL SL  [employer](ORTH,TRAU,BIO,SG) [id number] Left 1 Implanted  SCREW NLOCK 2.7X32 - [id number] Screw SCREW NLOCK 2.7X32  [employer](ORTH,TRAU,BIO,SG)  Left 1 Implanted  SCREW NL 2.7X30 - [id number] Screw SCREW NL 2.7X30  [employer](ORTH,TRAU,BIO,SG)  Left 1 Implanted  SCREW LOCK MDS 2.7X18 - [id number] Screw SCREW LOCK MDS 2.7X18  [employer](ORTH,TRAU,BIO,SG)  Left 1 Implanted  SCREW LOCK MDS 2.7X30 - [id number] Screw SCREW LOCK MDS 2.7X30  [employer](ORTH,TRAU,BIO,SG)  Left 1 Implanted   PLATE LOCK MDS 7.2K73 - [id number] Plate PLATE LOCK MDS 7.2K73  [employer](ORTH,TRAU,BIO,SG)  Left 2 Implanted  2.7 4h t plate 7 hole      Left 1 Implanted  2.7 NL X 15      Left 1 Implanted    OPERATIVE PROCEDURE: The patient was brought to the operating room and placed in the supine position. All bony prominences were padded. Non sterile Tourniquet was placed.  General anesthesia was administered. The left lower extremity was prepped and draped in the usual sterile fashion.  Time out was performed.  Tourniquet was inflated.  Midline anterior knee incision was made over the patient's old incision and extended distally.  Dissected tissue down to the fracture site.  There is disruption of the capsule and extensor mechanism which was still attached to the avulsed tibial tubercle fragment.  Fracture hematoma was thoroughly debrided with pulse lavage normal saline.  A suture anchor was then placed in the proximal tibia fracture bed.  First pass did not have good fixation and pulled out this was reloaded and really inserted into better bone that was drilled through.  The fracture fragment was then able to be reduced with the knee in full extension.  A radiolucent triangle was placed under the ankle to keep the knee locked straight.  The fragment was fairly wide and just below the baseplate.  The baseplate and knee  prosthesis itself was well-fixed.  A 4 x 7 hole plate was applied over the fracture.  Plate benders were used to mold the plate to  conform to the proximal tibia along the fracture site.  2 K wires through the plate were placed  for provisional fixation.  Fluoroscopy confirmed appropriate position of the plate.  We then placed a screw through the distal fragment to buttress the fracture site.  Then a combination of nonlocking and locking screws were placed in the distal tibia.  We then placed 1 nonlocking in the proximal tibia with an additional 3 locking screws to the proximal fragment to hold  the fracture reduced.  I was pleased with the fixation and reduction at this point.  We then turned our attention to repairing the patellar tendon to the suture anchor.  A Krakw stitch was performed along the medial and lateral sides of the patellar tendon and this was tensioned and tied.  The retinaculum was also repaired on each side with 0 Vicryl.   The wounds were irrigated, and closed with 2-0 vicryl.  3-0 Monocryl closure for the skin.  Dermabond and Aquacel dressing was applied over the incision.  The leg was wrapped with Webril and an Ace wrap.  The knee was placed back into a knee immobilizer locked in extension.SABRA She was awakened and returned to the PACU in stable and satisfactory condition. There were no complications.  Post op recs: WB: Weightbearing as tolerated left lower extremity with the knee locked in extension at all times Imaging: PACU xrays Dressing: Aquacel dressing intact until follow-up DVT prophylaxis: aspirin  81mg  BID x4 weeks Follow up: 2 weeks after surgery for a wound check and suture removal with Dr. Edna at [facility].  Address: [address], [place], [place] [address]  Office Phone: [phone]  Toribio Edna, MD Orthopaedic Surgery

## 2024-04-21 NOTE — Progress Notes (Signed)
 [facility]  PROCEDURAL EXPEDITER PROGRESS NOTE  Patient Name: [name]  DOB:1935/09/02 Date of Admission: 04/19/2024  Date of Assessment:04/21/24   -------------------------------------------------------------------------------------------------------------------   Brief clinical summary: 88 yr old pt that fell , PMH HTN and Osteoporosis,Having surgery today on her  left knee Panel 1 Case ID: [id number] Orthopedics OPEN REDUCTION INTERNAL FIXATION (ORIF) TIBIA/FIBULA FRACTURE - Left  Choice    Orders in place:  Yes   Communication with surgical team if no orders: n/a  Labs, test, and orders reviewed: yes  Requires surgical clearance:  No  What type of clearance: n/a  Clearance received: n/a  Barriers noted:n/a   Intervention provided by [facility] team: n/a  Barrier resolved:  not applicable   -------------------------------------------------------------------------------------------------------------------  [employer], [name] DELENA [name] Please contact us  directly via secure chat (search for [facility]) or by calling us  at [phone] [facility]).

## 2024-04-21 NOTE — Progress Notes (Signed)
 Report given to [facility] OR for surgery.

## 2024-04-21 NOTE — Anesthesia Procedure Notes (Signed)
 Procedure Name: LMA Insertion Date/Time: 04/21/2024 3:28 PM  Performed by: Lanning Cena RAMAN, CRNAPre-anesthesia Checklist: Patient identified, Emergency Drugs available, Suction available and Patient being monitored Patient Re-evaluated:Patient Re-evaluated prior to induction Oxygen Delivery Method: Circle system utilized Preoxygenation: Pre-oxygenation with 100% oxygen Induction Type: IV induction LMA: LMA inserted LMA Size: 4.0 Number of attempts: 1 Placement Confirmation: positive ETCO2 Tube secured with: Tape Dental Injury: Teeth and Oropharynx as per pre-operative assessment

## 2024-04-21 NOTE — Progress Notes (Addendum)
 Initial Nutrition Assessment  DOCUMENTATION CODES:   Not applicable  INTERVENTION:  Encourage po intake Recommend regular diet after OR MVI with minerals daily  Discussed ways to increase calories and protein through meals, encouraged protein at every meal  High calorie, High protein handout in AVS  *Pt does not want ONS or snacks added to her regimen   NUTRITION DIAGNOSIS:   Increased nutrient needs related to hip fracture as evidenced by estimated needs.  GOAL:   Patient will meet greater than or equal to 90% of their needs  MONITOR:   Diet advancement, Labs, Supplement acceptance, PO intake  REASON FOR ASSESSMENT:   Consult Hip fracture protocol  ASSESSMENT:  88 y.o. female with PMH of HTN, GERD, arthritis, osteoporosis. Presented after fall where she sustained left knee periprosthetic fracture, transferred from [facility] to [facility] for orthopedic evaluation and surgery.   12/1 - Plan for ORIF of left tibia/fibula fracture   Kept NPO at midnight for possible surgical intervention today. May be transferred to [facility] long for intervention.   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 Lives at home alone, prepares her own meals. Eats 3 meals per day but, does not like to snack. Drinks coffee, juice, water throughout the day. Weight has been decreasing in the last year. UBW is 175 lbs. Pt has lost 9 lbs, 5% in the last year and 3.5 lbs, 2% in the last 2 months. Pt reports she has an appetite and she has been losing weight because she eats smaller portions and is eating healthier. Mild muscle and fat depletions however pt with swelling in lower extremities that may be masking additional depletions.   Encouraged increased calories and protein for wound healing. Pt agreeable but does not want to start any protein supplements. RD encouraged snacks in between meals however, pt refused. Discussed increasing protein and calories through meals and  encouraged protein at every meal.   Dietary recall: Breakfast: eggs, 2 slices of bacon, 2 slices of toast, juice  Lunch: Sandwich  Dinner: Protein, vegetables, starch  No snacks Drinks: coffee, juice, water   Admit weight: 86.5 kg - Bed weight  Current weight: 86.5 kg  - Bed weight UBW: 79.5 kg Wt Readings from Last 10 Encounters:  04/19/24 86.5 kg  04/19/24 86.5 kg  03/12/24 81.2 kg  10/16/23 81.7 kg  05/21/24 83.5 kg  - 9 lbs, 5% in 1 year from 05/21/24 -3.5 lbs, 2% in 1 month from 03/12/24  Average Meal Intake: 11/30: 100% intake x 1 recorded meals  Nutritionally Relevant Medications: Scheduled Meds:  atorvastatin  40 mg Oral Daily   Chlorhexidine Gluconate Cloth  6 each Topical Daily   [START ON 04/22/2024] multivitamin with minerals  1 tablet Oral Daily   pantoprazole  (PROTONIX ) IV  40 mg Intravenous Q12H   prednisoLONE acetate  1 drop Both Eyes QHS   senna-docusate  1 tablet Oral BID   Labs Reviewed: No recent labs   NUTRITION - FOCUSED PHYSICAL EXAM:  Flowsheet Row Most Recent Value  Orbital Region Mild depletion  Upper Arm Region No depletion  Thoracic and Lumbar Region No depletion  Buccal Region Mild depletion  Temple Region Mild depletion  Clavicle Bone Region Mild depletion  Clavicle and Acromion Bone Region Mild depletion  Scapular Bone Region Mild depletion  Dorsal Hand Mild depletion  Patellar Region Unable to assess  [Swelling]  Anterior Thigh Region Unable to assess  [Swelling]  Posterior Calf Region Unable  to assess  [Swelling]  Edema (RD Assessment) Moderate  [Lower extremitites]  Hair Reviewed  Eyes Reviewed  Mouth Reviewed  Skin Reviewed  Nails Reviewed    Diet Order:   Diet Order             Diet NPO time specified  Diet effective midnight                   EDUCATION NEEDS:   Education needs have been addressed  Skin:  Skin Assessment: Reviewed RN Assessment  Last BM:  11/28 - Per pt report  Height:   Ht Readings  from Last 1 Encounters:  04/19/24 5' 9 (1.753 m)    Weight:   Wt Readings from Last 1 Encounters:  04/19/24 86.5 kg    Ideal Body Weight:  65.9 kg  BMI:  Body mass index is 28.16 kg/m.  Estimated Nutritional Needs:   Kcal:  1700-2000 kcal  Protein:  100-120 gm  Fluid:  >1.7L/day *Based on bodyweight of 86.5 kg   Olivia Kenning, RD Registered Dietitian  See Amion for more information

## 2024-04-21 NOTE — Transfer of Care (Signed)
 Immediate Anesthesia Transfer of Care Note  Patient: [name]  Procedure(s) Performed: OPEN REDUCTION INTERNAL FIXATION (ORIF) TIBIA/FIBULA FRACTURE (Left: Knee)  Patient Location: PACU  Anesthesia Type:GA combined with regional for post-op pain  Level of Consciousness: drowsy  Airway & Oxygen Therapy: Patient Spontanous Breathing and Patient connected to face mask oxygen  Post-op Assessment: Report given to RN and Post -op Vital signs reviewed and stable  Post vital signs: Reviewed and stable  Last Vitals:  Vitals Value Taken Time  BP    Temp    Pulse    Resp    SpO2      Last Pain:  Vitals:   04/21/24 1502  TempSrc:   PainSc: 0-No pain         Complications: No notable events documented.

## 2024-04-21 NOTE — Progress Notes (Signed)
 Orthopedic Tech Progress Note Patient Details:  RYVER ZADROZNY 10/23/1935 [id number]  Patient ID: [name], female   DOB: 1935-06-29, 88 y.o.   MRN: [id number] Dropped off bledsoe brace per request from OR nurse.  Morna Pink 04/21/2024, 5:35 PM

## 2024-04-21 NOTE — Progress Notes (Signed)

## 2024-04-21 NOTE — Progress Notes (Signed)
 PROGRESS NOTE    [name]  FMW:[id number] DOB: [birth year] DOA: 04/19/2024 PCP: Epifanio Alm SQUIBB, MD  No chief complaint on file.   Brief Narrative:   [name] is a 88 y.o. female with past medical history  of  essential HTN, osteoporosis coming for fall, where she sustained left knee periprosthetic fracture, transferred from [facility] to [facility] for orthopedic evaluation and surgery  Assessment & Plan:   Principal Problem:   Fall at home, initial encounter  Mechanical fall Left knee periprosthetic fracture -Management per orthopedic.  N.p.o. after midnight, continue with as needed pain regimen, continue with nonweightbearing. - Plan for surgical repair today.  Transferred to [facility] for surgery by Dr. Edna this afternoon   Acute blood loss anemia Proximal pretibial hematoma -Hemoglobin was significant drop this morning to 6.9, most recent hemoglobin 10.8 last month as an outpatient -Received 1 unit PRBC transfusion,, will repeat CBC, will target hemoglobin at least of 8 in anticipation for surgery. - Significant hematoma on physical exam, and imaging, will continue to monitor closely, continue with supportive transfusion, - Hold aspirin , SCD for DVT prophylaxis - Globin is 8 this morning, will transfuse another unit PRBC as plan to go to the OR today and expected further drop.  Leukocytosis -Likely stress related, normalized - Low-grade temperature overnight 0.1, she was encouraged to incentive spirometer  Hypertension - Blood pressure is labile, so we will hold oral regimen and keep on as needed hydralazine  Osteoporosis - She is on Fosamax at home, will  start on calcium with vitamin D here  Hyperlipidemia Continue with atorvastatin-  GERD -Continue with PPI  Urinary retention -Foley catheter inserted in [facility] due to retention, voiding trial after surgery   DVT prophylaxis: (SCD for now given her anemia, pharmacologic DVT prophylaxis once  hemoglobin stabilizes) Code Status: (Full) Family Communication: (none at bedside, unable to reach son or daughter by phone) Disposition:   Status is: Inpatient    Consultants:  ortho   Subjective:  No significant events overnight, Tmax 100.1 over last 24 hours,  Objective: Vitals:   04/21/24 0400 04/21/24 0739 04/21/24 0822 04/21/24 0842  BP: (!) 117/43     Pulse: 69     Resp: 20     Temp: 98.6 F (37 C) 99 F (37.2 C) 99.1 F (37.3 C) 98.9 F (37.2 C)  TempSrc: Oral Oral Oral Oral  SpO2: 96%     Weight:      Height:        Intake/Output Summary (Last 24 hours) at 04/21/2024 0924 Last data filed at 04/21/2024 0740 Gross per 24 hour  Intake 240 ml  Output 2600 ml  Net -2360 ml   Filed Weights   04/19/24 1536  Weight: 86.5 kg    Examination:  Awake Alert, Oriented X 3, No new F.N deficits, Normal affect Diminished air entry at the bases RRR,No Gallops +ve B.Sounds, Abd Soft Left knee with tenderness to palpation, significant ecchymosis and anterior area, neurovascularly distally intact, wearing left knee immobilizer    Data Reviewed: I have personally reviewed following labs and imaging studies  CBC: Recent Labs  Lab 04/19/24 0105 04/20/24 0345 04/20/24 1146 04/20/24 1746 04/21/24 0300  WBC 15.1* 6.2 7.0 7.4 6.7  NEUTROABS  --  3.4  --   --  3.8  HGB 10.9* 6.9* 8.1* 8.2* 8.0*  HCT 34.5* 21.6* 24.8* 24.8* 24.1*  MCV 97.7 96.9 96.9 95.4 95.6  PLT 193 125* 114* 114* 120*  Basic Metabolic Panel: Recent Labs  Lab 04/19/24 0105 04/19/24 1446 04/20/24 0345 04/21/24 0300  NA 136 137 133* 135  K 4.0 4.6 4.4 4.6  CL 103 105 103 102  CO2 21* 20* 21* 23  GLUCOSE 131* 94 114* 114*  BUN 17 17 17 10   CREATININE 0.92 0.89 0.88 0.82  CALCIUM 9.2 8.4* 8.0* 8.3*    GFR: Estimated Creatinine Clearance: 57.8 mL/min (by C-G formula based on SCr of 0.82 mg/dL).  Liver Function Tests: Recent Labs  Lab 04/19/24 0105 04/19/24 1446 04/20/24 0345  04/21/24 0300  AST 37 30 27 31   ALT 23 20 18 19   ALKPHOS 59 37* 34* 40  BILITOT 0.4 0.6 0.5 0.9  PROT 6.7 5.2* 4.7* 4.8*  ALBUMIN 4.0 3.0* 2.6* 2.4*    CBG: No results for input(s): GLUCAP in the last 168 hours.   No results found for this or any previous visit (from the past 240 hours).       Radiology Studies: No results found.       Scheduled Meds:  atorvastatin  40 mg Oral Daily   Chlorhexidine Gluconate Cloth  6 each Topical Daily   pantoprazole  (PROTONIX ) IV  40 mg Intravenous Q12H   prednisoLONE acetate  1 drop Both Eyes QHS   senna-docusate  1 tablet Oral BID   Continuous Infusions:   LOS: 2 days     Brayton Lye, MD [facility]   To contact the attending provider between 7A-7P or the covering provider during after hours 7P-7A, please log into the web site [email or web address] and access using universal  password for that web site. If you do not have the password, please call the hospital operator.  04/21/2024, 9:24 AM

## 2024-04-21 NOTE — Interval H&P Note (Signed)
 The patient has been re-examined, and the chart reviewed, and there have been no interval changes to the documented history and physical.   She sustained a fall on Friday with a tibial tubercle fracture.  She was transferred from [place] to [facility] and then today to [facility] for surgery.  She was previously independent ambulator without assistive device.  She only has pain localized around the left knee.  She has associated swelling and bruising in this area. Imaging reviewed demonstrates a tibial tubercle avulsion fracture with likely disruption of the extensor mechanism.  Will plan for repair of the tibial tubercle fracture with plate and screws and suture repair of the patellar tendon.  Immobilization in a hinged knee immobilizer locked in extension postop.  The risks benefits and alternatives were discussed with the patient including but not limited to the risks of nonoperative treatment, versus surgical intervention including infection, bleeding, nerve injury, malunion, nonunion, the need for revision surgery, hardware prominence, hardware failure, the need for hardware removal, blood clots, cardiopulmonary complications, morbidity, mortality, among others, and they were willing to proceed.     The operative side was examined and the patient was confirmed to have sensation to DPN, SPN, TN intact, Motor EHL, ext, flex 5/5, and DP 2+, PT 2+, No significant edema.    The risks, benefits, and alternatives have been discussed at length with patient, and the patient is willing to proceed.  Left knee marked. Consent has been signed.

## 2024-04-22 ENCOUNTER — Encounter (HOSPITAL_COMMUNITY): Payer: Self-pay | Admitting: Orthopedic Surgery

## 2024-04-22 DIAGNOSIS — M9712XA Periprosthetic fracture around internal prosthetic left knee joint, initial encounter: Secondary | ICD-10-CM | POA: Diagnosis not present

## 2024-04-22 DIAGNOSIS — Y92009 Unspecified place in unspecified non-institutional (private) residence as the place of occurrence of the external cause: Secondary | ICD-10-CM | POA: Diagnosis not present

## 2024-04-22 DIAGNOSIS — W19XXXA Unspecified fall, initial encounter: Secondary | ICD-10-CM | POA: Diagnosis not present

## 2024-04-22 LAB — TYPE AND SCREEN
ABO/RH(D): A POS
Antibody Screen: NEGATIVE
Unit division: 0
Unit division: 0

## 2024-04-22 LAB — CBC
HCT: 26 % — ABNORMAL LOW (ref 36.0–46.0)
Hemoglobin: 8.6 g/dL — ABNORMAL LOW (ref 12.0–15.0)
MCH: 31.7 pg (ref 26.0–34.0)
MCHC: 33.1 g/dL (ref 30.0–36.0)
MCV: 95.9 fL (ref 80.0–100.0)
Platelets: 121 K/uL — ABNORMAL LOW (ref 150–400)
RBC: 2.71 MIL/uL — ABNORMAL LOW (ref 3.87–5.11)
RDW: 14.6 % (ref 11.5–15.5)
WBC: 6.1 K/uL (ref 4.0–10.5)
nRBC: 0 % (ref 0.0–0.2)

## 2024-04-22 LAB — BPAM RBC
Blood Product Expiration Date: 202512202359
Blood Product Expiration Date: 202512222359
ISSUE DATE / TIME: 202511300542
ISSUE DATE / TIME: 202512010811
Unit Type and Rh: 6200
Unit Type and Rh: 6200

## 2024-04-22 LAB — BASIC METABOLIC PANEL WITH GFR
Anion gap: 8 (ref 5–15)
BUN: 13 mg/dL (ref 8–23)
CO2: 26 mmol/L (ref 22–32)
Calcium: 8.3 mg/dL — ABNORMAL LOW (ref 8.9–10.3)
Chloride: 98 mmol/L (ref 98–111)
Creatinine, Ser: 0.74 mg/dL (ref 0.44–1.00)
GFR, Estimated: >60 mL/min (ref >60)
Glucose, Bld: 108 mg/dL — ABNORMAL HIGH (ref 70–99)
Potassium: 4.8 mmol/L (ref 3.5–5.1)
Sodium: 132 mmol/L — ABNORMAL LOW (ref 135–145)

## 2024-04-22 LAB — VITAMIN D 25 HYDROXY (VIT D DEFICIENCY, FRACTURES): Vit D, 25-Hydroxy: 51.6 ng/mL (ref 30–100)

## 2024-04-22 MED ORDER — METOPROLOL TARTRATE 12.5 MG HALF TABLET
12.5000 mg | ORAL_TABLET | Freq: Two times a day (BID) | ORAL | Status: DC
  Administered 2024-04-22 – 2024-04-26 (×8): 12.5 mg via ORAL
  Filled 2024-04-22 (×8): qty 1

## 2024-04-22 MED ORDER — OYSTER SHELL CALCIUM/D3 500-5 MG-MCG PO TABS
1.0000 | ORAL_TABLET | Freq: Every day | ORAL | Status: DC
  Administered 2024-04-22 – 2024-04-26 (×5): 1 via ORAL
  Filled 2024-04-22 (×5): qty 1

## 2024-04-22 NOTE — Anesthesia Postprocedure Evaluation (Signed)
 Anesthesia Post Note  Patient: NEAH SPORRER  Procedure(s) Performed: OPEN REDUCTION INTERNAL FIXATION (ORIF) TIBIA/FIBULA FRACTURE (Left: Knee)     Patient location during evaluation: PACU Anesthesia Type: General Level of consciousness: awake and alert Pain management: pain level controlled Vital Signs Assessment: post-procedure vital signs reviewed and stable Respiratory status: spontaneous breathing, nonlabored ventilation, respiratory function stable and patient connected to nasal cannula oxygen Cardiovascular status: blood pressure returned to baseline and stable Postop Assessment: no apparent nausea or vomiting Anesthetic complications: no   No notable events documented.           Lynwood MARLA Cornea

## 2024-04-22 NOTE — Progress Notes (Signed)
     Subjective:  Patient reports pain as mild.  No issues overnight.  Discussed plan for mobilization with therapy.  Discussed importance of keeping the brace on and knee locked in extension at all times.  Yesterday's total administered Morphine Milligram Equivalents: 50   Objective:   VITALS:   Vitals:   04/21/24 1807 04/21/24 2021 04/22/24 0044 04/22/24 0613  BP: (!) 156/54 (!) 142/72 (!) 100/52 (!) 119/56  Pulse: 69 77 62 84  Resp: 14 16 15 15   Temp: 98 F (36.7 C) 98.1 F (36.7 C) 99.8 F (37.7 C) 97.9 F (36.6 C)  TempSrc: Oral Oral Oral Oral  SpO2: 97% 95% 95% 96%  Weight:      Height:        Sensation intact distally Intact pulses distally Dorsiflexion/Plantar flexion intact Incision: dressing C/D/I Compartment soft Bledsoe brace intact locked in extension  Lab Results  Component Value Date   WBC 6.1 04/22/2024   HGB 8.6 (L) 04/22/2024   HCT 26.0 (L) 04/22/2024   MCV 95.9 04/22/2024   PLT 121 (L) 04/22/2024   BMET    Component Value Date/Time   NA 132 (L) 04/22/2024 0435   K 4.8 04/22/2024 0435   CL 98 04/22/2024 0435   CO2 26 04/22/2024 0435   GLUCOSE 108 (H) 04/22/2024 0435   BUN 13 04/22/2024 0435   CREATININE 0.74 04/22/2024 0435   CALCIUM 8.3 (L) 04/22/2024 0435   GFRNONAA >60 04/22/2024 0435      Xray: interval reduction of tibial tubercle fx, intact hardware no adverse features  Assessment/Plan: 1 Day Post-Op   Principal Problem:   Fall at home, initial encounter  S/p L tibial tubercle fx and extensor mechanism repair 04/21/24  Post op recs: WB: Weightbearing as tolerated left lower extremity with the knee locked in extension at all times Imaging: PACU xrays Dressing: Aquacel dressing intact until follow-up DVT prophylaxis: aspirin  81mg  BID x4 weeks Follow up: 2 weeks after surgery for a wound check and suture removal with Dr. Edna at [facility].  Address: [address], [place], [place] [address]   Office Phone: [phone]    TORIBIO DELENA EDNA 04/22/2024, 7:02 AM   Toribio Edna, MD  Contact information:   [phone] 7am-5pm epic message Dr. Edna, or call office for patient follow up: [phone] After hours and holidays please check [email or web address] for group call information for [facility]

## 2024-04-22 NOTE — Evaluation (Signed)
 Physical Therapy Evaluation Patient Details Name: [name] MRN: [id number] DOB: [birth year] Today's Date: 04/22/2024  History of Present Illness  88 yo female  seen at [facility] with L knee pain.   X-ray showed left knee periprosthetic fracture.  CT confirmed left knee periprostatic fracture and also showed hemarthrosis and pretibial hematoma measuring 8.6 x 1.7 x 7.5 cm.  Case discussed with orthopedic surgery and she was transferred to [facility] for surgical repair. S/p ORIF L tibial tubercle fx and extensor mechanism repair 04/21/24.  PMH: thalamic hemorrhage, TMJ, bil TKAs, back surgery  Clinical Impression  Pt admitted with above diagnosis.   Pt is independent at her baseline, lives alone, drives; Dtr and son live in [place]. Dtr present at time of PT eval, supportive, agreeable to rehab post acute,pt reluctant. Pt has friend locally that can assist however cannot provide extensive physical assistance per dtr. Pt required +2 max assist for STS from recliner, limited by RLE/bil UE weakness and pain-LLE.  Patient will benefit from continued inpatient follow up therapy, <3 hours/day Continue to follow in acute setting.   Pt currently with functional limitations due to the deficits listed below (see PT Problem List). Pt will benefit from acute skilled PT to increase their independence and safety with mobility to allow discharge.           If plan is discharge home, recommend the following: Two people to help with walking and/or transfers;Two people to help with bathing/dressing/bathroom;Help with stairs or ramp for entrance;Assist for transportation;Assistance with cooking/housework   Can travel by private vehicle   No    Equipment Recommendations None recommended by PT  Recommendations for Other Services       Functional Status Assessment Patient has had a recent decline in their functional status and demonstrates the ability to make significant improvements in function in a reasonable and  predictable amount of time.     Precautions / Restrictions Precautions Precautions: Fall Other Brace: Bledsoe Brace at all times, locked in full extension      Mobility  Bed Mobility               General bed mobility comments: pt OOB    Transfers Overall transfer level: Needs assistance Equipment used: Rolling walker (2 wheels) Transfers: Sit to/from Stand Sit to Stand: Max assist, +2 physical assistance, +2 safety/equipment           General transfer comment: +2 assist to stand from recliner. assist for anterior-superior wt shift and LLE position. cues for hand placement and LE position. chair elevated while pt in standing    Ambulation/Gait               General Gait Details: unable  Stairs            Wheelchair Mobility     Tilt Bed    Modified Rankin (Stroke Patients Only)       Balance Overall balance assessment: Needs assistance, History of Falls Sitting-balance support: Feet supported, No upper extremity supported Sitting balance-Leahy Scale: Fair     Standing balance support: Reliant on assistive device for balance, Bilateral upper extremity supported Standing balance-Leahy Scale: Zero Standing balance comment: pt unable to come to full stand with +2 assist and use of RW                             Pertinent Vitals/Pain Pain Assessment Pain Assessment: 0-10 Pain Location: left knee Pain Descriptors /  Indicators: Sore, Grimacing, Guarding Pain Intervention(s): Limited activity within patient's tolerance, Monitored during session, Premedicated before session, Repositioned    Home Living Family/patient expects to be discharged to:: Private residence                        Prior Function                       Extremity/Trunk Assessment   Upper Extremity Assessment Upper Extremity Assessment: Defer to OT evaluation;Overall [facility] for tasks assessed    Lower Extremity Assessment Lower Extremity  Assessment: LLE deficits/detail;RLE deficits/detail RLE Deficits / Details: hip flexors 2+/5, knee 3+/5, 4/5, AROM grossly WFL LLE: Unable to fully assess due to immobilization       Communication   Communication Communication: No apparent difficulties    Cognition Arousal: Alert Behavior During Therapy: WFL for tasks assessed/performed   PT - Cognitive impairments: No apparent impairments                         Following commands: Intact       Cueing Cueing Techniques: Verbal cues, Gestural cues     General Comments      Exercises     Assessment/Plan    PT Assessment Patient needs continued PT services  PT Problem List Decreased strength;Decreased activity tolerance;Decreased balance;Decreased knowledge of use of DME;Pain;Decreased mobility       PT Treatment Interventions DME instruction;Therapeutic exercise;Gait training;Stair training;Functional mobility training;Therapeutic activities;Patient/family education    PT Goals (Current goals can be found in the Care Plan section)  Acute Rehab PT Goals Patient Stated Goal: to go home PT Goal Formulation: With patient/family Time For Goal Achievement: 05/06/24 Potential to Achieve Goals: Fair    Frequency Min 4X/week     Co-evaluation               AM-PAC PT 6 Clicks Mobility  Outcome Measure Help needed turning from your back to your side while in a flat bed without using bedrails?: A Lot Help needed moving from lying on your back to sitting on the side of a flat bed without using bedrails?: Total Help needed moving to and from a bed to a chair (including a wheelchair)?: Total Help needed standing up from a chair using your arms (e.g., wheelchair or bedside chair)?: Total Help needed to walk in hospital room?: Total Help needed climbing 3-5 steps with a railing? : Total 6 Click Score: 7    End of Session Equipment Utilized During Treatment: Gait belt;Other (comment) (L Bledsoe brace  locked in full extension) Activity Tolerance: Patient tolerated treatment well Patient left: with call bell/phone within reach;with chair alarm set;with family/visitor present Nurse Communication: Mobility status PT Visit Diagnosis: Other abnormalities of gait and mobility (R26.89)    Time: 8671-8645 PT Time Calculation (min) (ACUTE ONLY): 26 min   Charges:   PT Evaluation $PT Eval Low Complexity: 1 Low PT Treatments $Therapeutic Activity: 8-22 mins PT General Charges $$ ACUTE PT VISIT: 1 Visit         Chasidy Janak, PT  Acute Rehab Dept [facility]) [phone]  04/22/2024   [facility] 04/22/2024, 5:37 PM

## 2024-04-22 NOTE — Plan of Care (Signed)

## 2024-04-22 NOTE — Plan of Care (Signed)
  Problem: Education: Goal: Knowledge of General Education information will improve Description: Including pain rating scale, medication(s)/side effects and non-pharmacologic comfort measures Outcome: Progressing   Problem: Activity: Goal: Risk for activity intolerance will decrease Outcome: Progressing   Problem: Nutrition: Goal: Adequate nutrition will be maintained Outcome: Progressing   Problem: Elimination: Goal: Will not experience complications related to urinary retention Outcome: Progressing   Problem: Pain Managment: Goal: General experience of comfort will improve and/or be controlled Outcome: Progressing

## 2024-04-22 NOTE — Plan of Care (Signed)

## 2024-04-22 NOTE — Progress Notes (Signed)
 PROGRESS NOTE  [name] FMW:[id number] DOB: 06-Dec-1935   PCP: Epifanio Alm SQUIBB, MD  Patient is from: Home.  Lives alone.  Has wheelchair, rolling walker and cane that she has not been using  DOA: 04/19/2024 LOS: 3  Chief complaints Knee pain    Brief Narrative / Interim history: 88 year old F with PMH of HTN and osteoporosis brought to [facility] ED by EMS with left knee pain after she fell off a stepstool and fell on her left knee.  She noted swelling and inability to bear weight.   In ED, stable vitals.  WBC 15.1.  X-ray showed left knee periprosthetic fracture.  CT confirmed left knee periprostatic fracture and also showed hemarthrosis and pretibial hematoma measuring 8.6 x 1.7 x 7.5 cm.  Case discussed with orthopedic surgery and she was transferred to [facility] for surgical repair.  Patient underwent ORIF of tibial fracture with extensor mechanism patella tendon repair by Dr. Edna on 12/1.  Ortho recommended WBAT with the knee locked in extension at all times.   Hospital course complicated by ABLA with Hgb dropping from 10.9-6.9.  She received 2 units of blood.   Subjective: Seen and examined earlier this morning.  No major events overnight or this morning.  No complaints.  Denies pain.  Patient's daughter at bedside.   Assessment and plan: Mechanical fall at home-fell off a stepstool while trying to turn off ceiling fan. Left knee periprosthetic fracture due to fall at home -S/p ORIF of tibial fracture with extensor mechanism patella tendon repair by Dr. Edna -Ortho recommended WBAT with the knee locked in extension at all times. -Antibiotics, pain control and VTE prophylaxis per surgery. -Check vitamin D level. -PT/OT   Acute blood loss anemia: Likely due to hematoma.  Anemia panel without nutritional deficiency. Proximal pretibial hematoma -S/p 2 units of blood.  H&H stable. -Continue monitoring   Leukocytosis: Likely stress related.  Resolved.    Essential hypertension: Normotensive. - Hydralazine as needed.   Osteoporosis: On Fosamax at home -Check vitamin D level. - Continue calcium with vitamin D here   Hyperlipidemia -Continue with atorvastatin-   GERD -Continue with PPI   Urinary retention: Resolved. - Strict intake and output  Thrombocytopenia: Mild - Continue monitor  Hyponatremia: Mild - Continue monitor  Increase nutrient needs: Body mass index is 28.16 kg/m. Nutrition Problem: Increased nutrient needs Etiology: hip fracture Signs/Symptoms: estimated needs Interventions: Refer to RD note for recommendations   DVT prophylaxis:  Maintain sequential compression device Start: 04/20/24 1041 SCDs Start: 04/19/24 1334  Code Status: Full code Family Communication: Data patient's daughter at bedside. Level of care: Med-Surg Status is: Inpatient Remains inpatient appropriate because: Left periprosthetic tibial fracture   Final disposition: To be determined   50 minutes with more than 50% spent in reviewing records, counseling patient/family and coordinating care.  Consultants:  Orthopedic surgery  Procedures: 12/1-ORIF of left periprosthetic tibial fracture  Microbiology summarized: None  Objective: Vitals:   04/21/24 2021 04/22/24 0044 04/22/24 0613 04/22/24 1004  BP: (!) 142/72 (!) 100/52 (!) 119/56 110/60  Pulse: 77 62 84 89  Resp: 16 15 15 16   Temp: 98.1 F (36.7 C) 99.8 F (37.7 C) 97.9 F (36.6 C) 99.7 F (37.6 C)  TempSrc: Oral Oral Oral   SpO2: 95% 95% 96% 95%  Weight:      Height:        Examination:  GENERAL: No apparent distress.  Nontoxic. HEENT: MMM.  Vision and hearing grossly intact.  NECK: Supple.  No apparent JVD.  RESP:  No IWOB.  Fair aeration bilaterally. CVS:  RRR. Heart sounds normal.  ABD/GI/GU: BS+. Abd soft, NTND.  MSK/EXT:  Left knee immobilizer in place. SKIN: no apparent skin lesion or wound NEURO: AA.  Oriented appropriately.  No apparent focal  neuro deficit. PSYCH: Calm. Normal affect.   Sch Meds:  Scheduled Meds:  aspirin  EC  81 mg Oral BID   atorvastatin  40 mg Oral Daily   Calcium Citrate-Vitamin D  1 tablet Oral Daily   [START ON 04/24/2024] cefadroxil  500 mg Oral BID   multivitamin with minerals  1 tablet Oral Daily   pantoprazole  (PROTONIX ) IV  40 mg Intravenous Q12H   prednisoLONE acetate  1 drop Both Eyes QHS   senna-docusate  1 tablet Oral BID   sodium chloride  flush  10-40 mL Intracatheter Q12H   Continuous Infusions:   ceFAZolin  (ANCEF ) IV 2 g (04/22/24 0609)   PRN Meds:.ALPRAZolam, bisacodyl, hydrALAZINE, HYDROcodone-acetaminophen , HYDROmorphone (DILAUDID) injection, ondansetron , polyethylene glycol, sodium chloride  flush  Antimicrobials: Anti-infectives (From admission, onward)    Start     Dose/Rate Route Frequency Ordered Stop   04/24/24 2200  cefadroxil (DURICEF) capsule 500 mg        500 mg Oral 2 times daily 04/21/24 1802 05/01/24 2159   04/24/24 0000  cefadroxil (DURICEF) 500 MG capsule        500 mg Oral 2 times daily 04/21/24 1755 05/01/24 2359   04/21/24 2200  ceFAZolin  (ANCEF ) IVPB 2g/100 mL premix        2 g 200 mL/hr over 30 Minutes Intravenous Every 8 hours 04/21/24 1802 04/24/24 2159   04/21/24 1444  ceFAZolin  (ANCEF ) 2-4 GM/100ML-% IVPB       Note to Pharmacy: Minor, Anneita S: cabinet override      04/21/24 1444 04/22/24 0259        I have personally reviewed the following labs and images: CBC: Recent Labs  Lab 04/20/24 0345 04/20/24 1146 04/20/24 1746 04/21/24 0300 04/22/24 0435  WBC 6.2 7.0 7.4 6.7 6.1  NEUTROABS 3.4  --   --  3.8  --   HGB 6.9* 8.1* 8.2* 8.0* 8.6*  HCT 21.6* 24.8* 24.8* 24.1* 26.0*  MCV 96.9 96.9 95.4 95.6 95.9  PLT 125* 114* 114* 120* 121*   BMP &GFR Recent Labs  Lab 04/19/24 0105 04/19/24 1446 04/20/24 0345 04/21/24 0300 04/22/24 0435  NA 136 137 133* 135 132*  K 4.0 4.6 4.4 4.6 4.8  CL 103 105 103 102 98  CO2 21* 20* 21* 23 26  GLUCOSE  131* 94 114* 114* 108*  BUN 17 17 17 10 13   CREATININE 0.92 0.89 0.88 0.82 0.74  CALCIUM 9.2 8.4* 8.0* 8.3* 8.3*   Estimated Creatinine Clearance: 59.2 mL/min (by C-G formula based on SCr of 0.74 mg/dL). Liver & Pancreas: Recent Labs  Lab 04/19/24 0105 04/19/24 1446 04/20/24 0345 04/21/24 0300  AST 37 30 27 31   ALT 23 20 18 19   ALKPHOS 59 37* 34* 40  BILITOT 0.4 0.6 0.5 0.9  PROT 6.7 5.2* 4.7* 4.8*  ALBUMIN 4.0 3.0* 2.6* 2.4*   No results for input(s): LIPASE, AMYLASE in the last 168 hours. No results for input(s): AMMONIA in the last 168 hours. Diabetic: Recent Labs    04/19/24 1845  HGBA1C 5.8*   No results for input(s): GLUCAP in the last 168 hours. Cardiac Enzymes: No results for input(s): CKTOTAL, CKMB, CKMBINDEX, TROPONINI in the last 168 hours. No results for  input(s): PROBNP in the last 8760 hours. Coagulation Profile: Recent Labs  Lab 04/19/24 0105  INR 1.1   Thyroid  Function Tests: Recent Labs    04/19/24 1446 04/19/24 1845  TSH  --  1.802  FREET4 0.85  --    Lipid Profile: No results for input(s): CHOL, HDL, LDLCALC, TRIG, CHOLHDL, LDLDIRECT in the last 72 hours. Anemia Panel: Recent Labs    04/19/24 1446 04/19/24 1845  VITAMINB12 1,399*  --   FOLATE >20.0  --   FERRITIN 113  --   TIBC 228*  --   IRON 28  --   RETICCTPCT  --  1.1   Urine analysis:    Component Value Date/Time   COLORURINE YELLOW (A) 04/19/2024 1156   APPEARANCEUR CLEAR (A) 04/19/2024 1156   LABSPEC 1.014 04/19/2024 1156   PHURINE 5.0 04/19/2024 1156   GLUCOSEU NEGATIVE 04/19/2024 1156   HGBUR NEGATIVE 04/19/2024 1156   BILIRUBINUR NEGATIVE 04/19/2024 1156   KETONESUR NEGATIVE 04/19/2024 1156   PROTEINUR NEGATIVE 04/19/2024 1156   NITRITE NEGATIVE 04/19/2024 1156   LEUKOCYTESUR NEGATIVE 04/19/2024 1156   Sepsis Labs: Invalid input(s): PROCALCITONIN, LACTICIDVEN  Microbiology: No results found for this or any previous visit (from  the past 240 hours).  Radiology Studies: DG Knee Left Port Result Date: 04/21/2024 EXAM: 1 or 2 VIEW(S) XRAY OF THE LEFT KNEE 04/21/2024 06:22:00 PM COMPARISON: 04/19/2024 CLINICAL HISTORY: Post-operative state FINDINGS: BONES AND JOINTS: Left total knee arthroplasty and lateral sideplate and screw fixation of the proximal left tibia in place. No acute fracture. No focal osseous lesion. No joint dislocation. No significant joint effusion. SOFT TISSUES: Expected soft tissue gas and edema. Vascular calcifications present. IMPRESSION: 1. Left total knee arthroplasty and lateral sideplate and screw fixation of the proximal left tibia in place. Expected postoperative soft tissue gas and edema. Vascular calcifications noted. Electronically signed by: Franky Stanford MD 04/21/2024 10:11 PM EST RP Workstation: [id number]   DG Knee 1-2 Views Left Result Date: 04/21/2024 EXAM: XR Left Knee Total Arthroplasty, 1 or 2 Views TECHNIQUE: Fluoroscopy was provided by the radiology department for procedure. Radiologist was not present during examination. FLUOROSCOPY DOSE AND TYPE: Fluoroscopy time 12 seconds. Radiation Dose Index: Reference Air Kerma (in mGy) =1.0 COMPARISON: None available. CLINICAL HISTORY: Elective surgery. FINDINGS: Intraoperative fluoroscopic imaging of left knee total arthroplasty was performed. IMPRESSION: 1. Intraoperative fluoroscopic spot images of left knee total arthroplasty performed. NOTE: Intraoperative fluoroscopic spot images as above. Please refer to the intraoperative report for full details. Electronically signed by: Franky Stanford MD 04/21/2024 10:10 PM EST RP Workstation: [id number]   DG C-Arm 1-60 Min-No Report Result Date: 04/21/2024 Fluoroscopy was utilized by the requesting physician.  No radiographic interpretation.   DG C-Arm 1-60 Min-No Report Result Date: 04/21/2024 Fluoroscopy was utilized by the requesting physician.  No radiographic interpretation.      Jhostin Epps T.  Cela [facility]  If 7PM-7AM, please contact night-coverage [email or web address] 04/22/2024, 11:15 AM

## 2024-04-23 ENCOUNTER — Inpatient Hospital Stay (HOSPITAL_COMMUNITY)

## 2024-04-23 DIAGNOSIS — W19XXXA Unspecified fall, initial encounter: Secondary | ICD-10-CM | POA: Diagnosis not present

## 2024-04-23 DIAGNOSIS — Y92009 Unspecified place in unspecified non-institutional (private) residence as the place of occurrence of the external cause: Secondary | ICD-10-CM | POA: Diagnosis not present

## 2024-04-23 DIAGNOSIS — R55 Syncope and collapse: Secondary | ICD-10-CM | POA: Diagnosis not present

## 2024-04-23 DIAGNOSIS — I493 Ventricular premature depolarization: Secondary | ICD-10-CM

## 2024-04-23 DIAGNOSIS — I951 Orthostatic hypotension: Secondary | ICD-10-CM

## 2024-04-23 DIAGNOSIS — E785 Hyperlipidemia, unspecified: Secondary | ICD-10-CM

## 2024-04-23 DIAGNOSIS — M9712XA Periprosthetic fracture around internal prosthetic left knee joint, initial encounter: Secondary | ICD-10-CM | POA: Diagnosis not present

## 2024-04-23 DIAGNOSIS — I10 Essential (primary) hypertension: Secondary | ICD-10-CM | POA: Diagnosis not present

## 2024-04-23 LAB — RENAL FUNCTION PANEL
Albumin: 2.8 g/dL — ABNORMAL LOW (ref 3.5–5.0)
Anion gap: 7 (ref 5–15)
BUN: 13 mg/dL (ref 8–23)
CO2: 26 mmol/L (ref 22–32)
Calcium: 8.4 mg/dL — ABNORMAL LOW (ref 8.9–10.3)
Chloride: 101 mmol/L (ref 98–111)
Creatinine, Ser: 0.75 mg/dL (ref 0.44–1.00)
GFR, Estimated: >60 mL/min (ref >60)
Glucose, Bld: 122 mg/dL — ABNORMAL HIGH (ref 70–99)
Phosphorus: 2.8 mg/dL (ref 2.5–4.6)
Potassium: 4.5 mmol/L (ref 3.5–5.1)
Sodium: 134 mmol/L — ABNORMAL LOW (ref 135–145)

## 2024-04-23 LAB — CBC
HCT: 24.9 % — ABNORMAL LOW (ref 36.0–46.0)
Hemoglobin: 8.3 g/dL — ABNORMAL LOW (ref 12.0–15.0)
MCH: 31.8 pg (ref 26.0–34.0)
MCHC: 33.3 g/dL (ref 30.0–36.0)
MCV: 95.4 fL (ref 80.0–100.0)
Platelets: 153 K/uL (ref 150–400)
RBC: 2.61 MIL/uL — ABNORMAL LOW (ref 3.87–5.11)
RDW: 14.2 % (ref 11.5–15.5)
WBC: 6.4 K/uL (ref 4.0–10.5)
nRBC: 0 % (ref 0.0–0.2)

## 2024-04-23 LAB — ECHOCARDIOGRAM COMPLETE
Area-P 1/2: 3.42 cm2
Calc EF: 66.6 %
Height: 69 in
S' Lateral: 2.8 cm
Single Plane A2C EF: 57.7 %
Single Plane A4C EF: 73.4 %
Weight: 3051.17 [oz_av]

## 2024-04-23 LAB — MAGNESIUM: Magnesium: 1.9 mg/dL (ref 1.7–2.4)

## 2024-04-23 MED ORDER — PANTOPRAZOLE SODIUM 40 MG PO TBEC
40.0000 mg | DELAYED_RELEASE_TABLET | Freq: Every day | ORAL | Status: DC
  Administered 2024-04-23 – 2024-04-26 (×4): 40 mg via ORAL
  Filled 2024-04-23 (×4): qty 1

## 2024-04-23 NOTE — Consult Note (Signed)
 Cardiology Consultation   Patient ID: [name] MRN: [id number]; DOB: Oct 29, 1935  Admit date: 04/19/2024 Date of Consult: 04/23/2024  PCP:  Epifanio Alm SQUIBB, MD   [facility] Providers Cardiologist:  None        Patient Profile: [name] is a 88 y.o. female with a hx of hypertension, neuropathy, hx of L thalamic stroke in 2017 and GERD who is being seen 04/23/2024 for the evaluation of frequent PVCs at the request of Mignon Bump MD.  History of Present Illness: [name] has no prior cardiac history.   Presented to the ED on 11/29 via EMS after she fell, patient had climbed a stepstool and missed her step. EMS did witness a syncopal episode after they had assisted her from the ground to the chair, episode lasted 30-45s per EMS spreadsheet.  In the ED: BP: 116/50   ECG: sinus rhythm with chronic RBBB and borderline LAFB. HR 66 CXR, Hip XR, unremarkable CT Knee shows displaced periprosthetic fracture to tibia with hemarthrosis and hematoma.  Orthopedics were consulted. An episode of hypotension 84/75 on 11/30 at night. Hgb 6.9, down from ~11 on admission. She has now received two units of PRBC. Hgb 8.3 today Patient underwent knee surgery on 12/1.   Electrolytes this admission: K has remained > 4 and Mag 1.9 TSH WNL  On interview, patient shared she had been laying on the floor for about 30 minutes when EMS had finally got to her. They abruptly picked her up and sat her in the chair. She reported dizziness and extreme pain. She does not remember passing out  Has one prior episode as a teenager where she passed out. She had her tonsils removed and had passed out in the bathroom.  Denied chest pain, palpitations, or shortness of breath.   Past Medical History:  Diagnosis Date   Arthritis    BCC (basal cell carcinoma of skin) 06/21/2023   right nasal sidewall, refer for Mohs   GERD (gastroesophageal reflux disease)    Hx of basal cell carcinoma  01/13/2013   left parasternal chest   Hx of basal cell carcinoma 04/26/2015   right sacral area   Hx of basal cell carcinoma 04/26/2016   left temple   Hypertension     Past Surgical History:  Procedure Laterality Date   ABDOMINAL HYSTERECTOMY     APPENDECTOMY     BACK SURGERY     COLONOSCOPY WITH PROPOFOL  N/A 03/07/2017   Procedure: COLONOSCOPY WITH PROPOFOL ;  Surgeon: Toledo, Ladell POUR, MD;  Location: [facility] ENDOSCOPY;  Service: Gastroenterology;  Laterality: N/A;   ESOPHAGOGASTRODUODENOSCOPY N/A 03/07/2017   Procedure: ESOPHAGOGASTRODUODENOSCOPY (EGD);  Surgeon: Toledo, Ladell POUR, MD;  Location: [facility] ENDOSCOPY;  Service: Gastroenterology;  Laterality: N/A;   FOOT SURGERY Left 05/2016   JOINT REPLACEMENT Bilateral 2010 2011   OPEN REDUCTION INTERNAL FIXATION (ORIF) TIBIA/FIBULA FRACTURE Left 04/21/2024   Procedure: OPEN REDUCTION INTERNAL FIXATION (ORIF) TIBIA/FIBULA FRACTURE;  Surgeon: Edna Toribio LABOR, MD;  Location: [facility] ORS;  Service: Orthopedics;  Laterality: Left;  ORIF LEFT KNEE   REPLACEMENT TOTAL KNEE BILATERAL  2010       Scheduled Meds:  aspirin  EC  81 mg Oral BID   atorvastatin  40 mg Oral Daily   calcium-vitamin D  1 tablet Oral Daily   [START ON 04/24/2024] cefadroxil  500 mg Oral BID   metoprolol  tartrate  12.5 mg Oral BID   multivitamin with minerals  1 tablet Oral Daily   pantoprazole  (PROTONIX )  IV  40 mg Intravenous Q12H   prednisoLONE acetate  1 drop Both Eyes QHS   senna-docusate  1 tablet Oral BID   sodium chloride  flush  10-40 mL Intracatheter Q12H   Continuous Infusions:   ceFAZolin  (ANCEF ) IV 2 g (04/23/24 0519)   PRN Meds: ALPRAZolam, bisacodyl, hydrALAZINE, HYDROcodone-acetaminophen , HYDROmorphone (DILAUDID) injection, ondansetron , polyethylene glycol, sodium chloride  flush  Allergies:    Allergies  Allergen Reactions   Tramadol-Acetaminophen  Nausea Only    Other reaction(s): Other (See Comments) AMS    Social History:   Social History    Socioeconomic History   Marital status: Widowed    Spouse name: Not on file   Number of children: Not on file   Years of education: Not on file   Highest education level: Not on file  Occupational History   Not on file  Tobacco Use   Smoking status: Never   Smokeless tobacco: Not on file  Substance and Sexual Activity   Alcohol use: No   Drug use: Not on file   Sexual activity: Not on file  Other Topics Concern   Not on file  Social History Narrative   Not on file   Social Drivers of Health   Financial Resource Strain: Low Risk  (03/12/2024)   Received from [facility]   Overall Financial Resource Strain (CARDIA)    Difficulty of Paying Living Expenses: Not hard at all  Food Insecurity: No Food Insecurity (04/19/2024)   Hunger Vital Sign    Worried About Running Out of Food in the Last Year: Never true    Ran Out of Food in the Last Year: Never true  Transportation Needs: No Transportation Needs (04/20/2024)   PRAPARE - Administrator, Civil Service (Medical): No    Lack of Transportation (Non-Medical): No  Physical Activity: Not on file  Stress: Not on file  Social Connections: Moderately Integrated (04/20/2024)   Social Connection and Isolation Panel    Frequency of Communication with Friends and Family: More than three times a week    Frequency of Social Gatherings with Friends and Family: More than three times a week    Attends Religious Services: More than 4 times per year    Active Member of [employer] or Organizations: Yes    Attends Banker Meetings: More than 4 times per year    Marital Status: Widowed  Intimate Partner Violence: Not At Risk (04/19/2024)   Humiliation, Afraid, Rape, and Kick questionnaire    Fear of Current or Ex-Partner: No    Emotionally Abused: No    Physically Abused: No    Sexually Abused: No    Family History:    Family History  Problem Relation Age of Onset   Breast cancer Maternal  Grandmother      ROS:  Please see the history of present illness.   All other ROS reviewed and negative.     Physical Exam/Data: Vitals:   04/22/24 0613 04/22/24 1004 04/22/24 2032 04/23/24 0640  BP: (!) 119/56 110/60 (!) 123/56 123/60  Pulse: 84 89 91 91  Resp: 15 16 17 18   Temp: 97.9 F (36.6 C) 99.7 F (37.6 C) 98.8 F (37.1 C) 98.7 F (37.1 C)  TempSrc: Oral  Oral Oral  SpO2: 96% 95% 98%   Weight:      Height:        Intake/Output Summary (Last 24 hours) at 04/23/2024 1011 Last data filed at 04/23/2024 0900 Gross per 24 hour  Intake 714.91 ml  Output 1325 ml  Net -610.09 ml      04/21/2024    2:37 PM 04/19/2024    3:36 PM 04/19/2024   12:33 AM  Last 3 Weights  Weight (lbs) 190 lb 11.2 oz 190 lb 11.2 oz 190 lb 11.2 oz  Weight (kg) 86.5 kg 86.5 kg 86.5 kg     Body mass index is 28.16 kg/m.  General:  Pleasant older woman in no acute distress HEENT: normal Neck: no JVD Vascular: No carotid bruits; Distal pulses 2+ bilaterally Cardiac:  IRRR no murmur  Lungs:  clear to auscultation bilaterally, no wheezing, rhonchi or rales  Abd: soft, nontender, no hepatomegaly  Skin: warm and dry  Psych:  Normal affect   EKG:  The EKG was personally reviewed and demonstrates:  see hpi Telemetry:  Telemetry was personally reviewed and demonstrates:  sinus rhythm HR ~75, PVCs noted  Relevant CV Studies:  Echocardiogram 2023 at [facility] INTERPRETATION  NORMAL LEFT VENTRICULAR SYSTOLIC FUNCTION  NORMAL RIGHT VENTRICULAR SYSTOLIC FUNCTION  MILD VALVULAR REGURGITATION  ( Mild MR, TR, PR, and trivial AR) NO VALVULAR STENOSIS     Laboratory Data:  Chemistry Recent Labs  Lab 04/21/24 0300 04/22/24 0435 04/23/24 0544  NA 135 132* 134*  K 4.6 4.8 4.5  CL 102 98 101  CO2 23 26 26   GLUCOSE 114* 108* 122*  BUN 10 13 13   CREATININE 0.82 0.74 0.75  CALCIUM 8.3* 8.3* 8.4*  MG  --   --  1.9  GFRNONAA >60 >60 >60  ANIONGAP 10 8 7     Recent Labs  Lab 04/19/24 1446  04/20/24 0345 04/21/24 0300 04/23/24 0544  PROT 5.2* 4.7* 4.8*  --   ALBUMIN 3.0* 2.6* 2.4* 2.8*  AST 30 27 31   --   ALT 20 18 19   --   ALKPHOS 37* 34* 40  --   BILITOT 0.6 0.5 0.9  --    Hematology Recent Labs  Lab 04/21/24 0300 04/22/24 0435 04/23/24 0544  WBC 6.7 6.1 6.4  RBC 2.52* 2.71* 2.61*  HGB 8.0* 8.6* 8.3*  HCT 24.1* 26.0* 24.9*  MCV 95.6 95.9 95.4  MCH 31.7 31.7 31.8  MCHC 33.2 33.1 33.3  RDW 14.1 14.6 14.2  PLT 120* 121* 153   Thyroid   Recent Labs  Lab 04/19/24 1446 04/19/24 1845  TSH  --  1.802  FREET4 0.85  --      Radiology/Studies:  DG Knee Left Port Result Date: 04/21/2024 EXAM: 1 or 2 VIEW(S) XRAY OF THE LEFT KNEE 04/21/2024 06:22:00 PM COMPARISON: 04/19/2024 CLINICAL HISTORY: Post-operative state FINDINGS: BONES AND JOINTS: Left total knee arthroplasty and lateral sideplate and screw fixation of the proximal left tibia in place. No acute fracture. No focal osseous lesion. No joint dislocation. No significant joint effusion. SOFT TISSUES: Expected soft tissue gas and edema. Vascular calcifications present. IMPRESSION: 1. Left total knee arthroplasty and lateral sideplate and screw fixation of the proximal left tibia in place. Expected postoperative soft tissue gas and edema. Vascular calcifications noted. Electronically signed by: Franky Stanford MD 04/21/2024 10:11 PM EST RP Workstation: [id number]   DG Knee 1-2 Views Left Result Date: 04/21/2024 EXAM: XR Left Knee Total Arthroplasty, 1 or 2 Views TECHNIQUE: Fluoroscopy was provided by the radiology department for procedure. Radiologist was not present during examination. FLUOROSCOPY DOSE AND TYPE: Fluoroscopy time 12 seconds. Radiation Dose Index: Reference Air Kerma (in mGy) =1.0 COMPARISON: None available. CLINICAL HISTORY: Elective surgery. FINDINGS: Intraoperative fluoroscopic imaging of  left knee total arthroplasty was performed. IMPRESSION: 1. Intraoperative fluoroscopic spot images of left knee total  arthroplasty performed. NOTE: Intraoperative fluoroscopic spot images as above. Please refer to the intraoperative report for full details. Electronically signed by: Franky Stanford MD 04/21/2024 10:10 PM EST RP Workstation: [id number]   DG C-Arm 1-60 Min-No Report Result Date: 04/21/2024 Fluoroscopy was utilized by the requesting physician.  No radiographic interpretation.   DG C-Arm 1-60 Min-No Report Result Date: 04/21/2024 Fluoroscopy was utilized by the requesting physician.  No radiographic interpretation.     Assessment and Plan:  Syncopal Episode Symptomatology as above.  Unlikely to be arrhythmogenic though see below, and do not suspect ACS.  Will defer neurology work-up to primary given her history of CVA. Will follow up on echo results.   Most likely multifactorial orthostatic and vasovagal given abrupt position change and degree of pain experienced by patient.   Frequent PVCs Telemetry does show moderate burden of PVCs, though during an acute presentation of knee fracture with associated pain. Patient denied palpitations. Electrolytes have been WNL. Will follow up on echo results.  Could consider outpatient heart monitor to assess burden. Continue lopressor  12.5 mg BID  Hypertension BP: 123/60 She did have hypotension 2/2 hypovolemia this admission PTA cozaar 50 mg on hold, agree Continue lopressor  as above  Hyperlipidemia, per neurology goal LDL <70 Continue lipitor 40 mg  Per primary L Knee trauma s/p repair Acute blood loss anemia Leukocytosis Osteoporosis GERD Urinary retention Thrombocytopenia Hyponatremia Hypoalbuminemia Hx of CVA   Risk Assessment/Risk Scores:              For questions or updates, please contact [facility] Please consult [email or web address] for contact info under      Signed, Leontine LOISE Salen, PA-C  04/23/2024 10:11 AM

## 2024-04-23 NOTE — [place] FL2 (Signed)
 [place]  MEDICAID FL2 LEVEL OF CARE FORM     IDENTIFICATION  Patient Name: [name] Birthdate: [birth year] Sex: female Admission Date (Current Location): 04/19/2024  [facility] and [place] Number:  Producer, Television/film/video and Address:  [facility],  [address], [place] [address]      Provider Number: [id number]  Attending Physician Name and Address:  Kathrin Mignon DASEN, MD  Relative Name and Phone Number:  Lahoma Cone (daughter), [phone]    Current Level of Care: Hospital Recommended Level of Care: Skilled Nursing Facility Prior Approval Number:    Date Approved/Denied:   PASRR Number: [id number]  Discharge Plan: SNF    Current Diagnoses: Patient Active Problem List   Diagnosis Date Noted   Fall at home, initial encounter 04/19/2024   Anemia 06/21/2023   GERD (gastroesophageal reflux disease) 06/21/2023   Heart valve disease 06/21/2023   History of cornea transplant 06/21/2023   Hyperlipidemia 06/21/2023   Osteoporosis, 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   History of left shoulder replacement 06/21/2023   Extraction of tooth needed 03/21/2023   Rotator cuff tear arthropathy, left 09/22/2021   H/O colonoscopy 09/19/2018   Incontinence of feces 07/18/2018   Arthritis of left ankle 06/23/2018   Posterior tibial tendon dysfunction (PTTD) of left lower extremity 04/10/2016   Chronic osteoarthritis 02/01/2016   HTN (hypertension), benign 02/01/2016   Cerebrovascular accident (CVA) due to thrombosis of cerebral artery (HCC) 12/13/2015   Cerebral infarction (HCC) 11/28/2015   Dizzy 11/25/2015   Fatigue 11/25/2015   Status post total bilateral knee replacement 08/09/2014    Orientation RESPIRATION BLADDER Height & Weight     Self, Time, Situation, Place  Normal Continent, Indwelling catheter Weight: 86.5 kg Height:  5' 9 (175.3 cm)  BEHAVIORAL SYMPTOMS/MOOD NEUROLOGICAL BOWEL NUTRITION STATUS      Continent Diet (regular diet)  AMBULATORY  STATUS COMMUNICATION OF NEEDS Skin   Extensive Assist Verbally Surgical wounds (Left Knee)                       Personal Care Assistance Level of Assistance  Bathing, Feeding, Dressing Bathing Assistance: Limited assistance Feeding assistance: Limited assistance Dressing Assistance: Limited assistance     Functional Limitations Info  Sight, Hearing, Speech Sight Info: Impaired (eyeglasses) Hearing Info: Impaired (hard of hearing) Speech Info: Adequate    SPECIAL CARE FACTORS FREQUENCY  PT (By licensed PT), OT (By licensed OT)     PT Frequency: 5x/wk OT Frequency: 5x/wk            Contractures Contractures Info: Not present    Additional Factors Info  Code Status, Allergies, Psychotropic Code Status Info: Full code Allergies Info: Tramadol-acetaminophen  Psychotropic Info: N/A         Current Medications (04/23/2024):  This is the current hospital active medication list Current Facility-Administered Medications  Medication Dose Route Frequency Provider Last Rate Last Admin   ALPRAZolam [place]) tablet 0.25 mg  0.25 mg Oral TID PRN Cockerham, Alicia M, PA-C   0.25 mg at 04/20/24 1022   aspirin  EC tablet 81 mg  81 mg Oral BID Cockerham, Alicia M, PA-C   81 mg at 04/23/24 1018   atorvastatin (LIPITOR) tablet 40 mg  40 mg Oral Daily Cockerham, Alicia M, PA-C   40 mg at 04/23/24 1017   bisacodyl (DULCOLAX) EC tablet 5 mg  5 mg Oral Daily PRN Cockerham, Alicia M, PA-C       calcium-vitamin D (OSCAL WITH D) 500-5 MG-MCG per  tablet 1 tablet  1 tablet Oral Daily Gonfa, Taye T, MD   1 tablet at 04/23/24 1018   [START ON 04/24/2024] cefadroxil (DURICEF) capsule 500 mg  500 mg Oral BID Cockerham, Alicia M, PA-C       ceFAZolin  (ANCEF ) IVPB 2g/100 mL premix  2 g Intravenous Q8H Cockerham, Alicia M, PA-C 200 mL/hr at 04/23/24 0519 2 g at 04/23/24 9480   hydrALAZINE (APRESOLINE) injection 5 mg  5 mg Intravenous Q4H PRN Cockerham, Alicia M, PA-C       HYDROcodone-acetaminophen   (NORCO/VICODIN) 5-325 MG per tablet 1 tablet  1 tablet Oral Q4H PRN Cockerham, Alicia M, PA-C   1 tablet at 04/23/24 1019   HYDROmorphone (DILAUDID) injection 0.5-1 mg  0.5-1 mg Intravenous Q4H PRN Cockerham, Alicia M, PA-C       metoprolol  tartrate (LOPRESSOR ) tablet 12.5 mg  12.5 mg Oral BID Gonfa, Taye T, MD   12.5 mg at 04/23/24 1019   multivitamin with minerals tablet 1 tablet  1 tablet Oral Daily Cockerham, Alicia M, PA-C   1 tablet at 04/23/24 1018   ondansetron  (ZOFRAN -ODT) disintegrating tablet 4 mg  4 mg Oral Q8H PRN Cockerham, Alicia M, PA-C       pantoprazole  (PROTONIX ) EC tablet 40 mg  40 mg Oral Daily Gonfa, Taye T, MD       polyethylene glycol (MIRALAX / GLYCOLAX) packet 17 g  17 g Oral Daily PRN Cockerham, Alicia M, PA-C       prednisoLONE acetate (PRED FORTE) 1 % ophthalmic suspension 1 drop  1 drop Both Eyes QHS Cockerham, Alicia M, PA-C   1 drop at 04/22/24 2145   senna-docusate (Senokot-S) tablet 1 tablet  1 tablet Oral BID Cockerham, Alicia M, PA-C   1 tablet at 04/23/24 1018   sodium chloride  flush (NS) 0.9 % injection 10-40 mL  10-40 mL Intracatheter Q12H Elgergawy, Brayton RAMAN, MD   10 mL at 04/22/24 1034   sodium chloride  flush (NS) 0.9 % injection 10-40 mL  10-40 mL Intracatheter PRN Elgergawy, Brayton RAMAN, MD   10 mL at 04/22/24 1503     Discharge Medications: Please see discharge summary for a list of discharge medications.  Relevant Imaging Results:  Relevant Lab Results:   Additional Information SSN: [id number]  Alfonse JONELLE Rex, RN

## 2024-04-23 NOTE — Progress Notes (Signed)
  Echocardiogram 2D Echocardiogram has been performed.  [name] 04/23/2024, 3:44 PM

## 2024-04-23 NOTE — Progress Notes (Signed)
 PROGRESS NOTE  [name] FMW:[id number] DOB: 1935/08/13   PCP: Epifanio Alm SQUIBB, MD  Patient is from: Home.  Lives alone.  Has wheelchair, rolling walker and cane that she has not been using  DOA: 04/19/2024 LOS: 4  Chief complaints Knee pain    Brief Narrative / Interim history: 88 year old F with PMH of HTN and osteoporosis brought to [facility] ED by EMS with left knee pain after she fell off a stepstool and fell on her left knee.  She noted swelling and inability to bear weight.  Reportedly had brief syncopal episode when EMS got her up.   In ED, stable vitals.  WBC 15.1.  Initial EKG with junctional rhythm, RBBB, LAFB and LVH.  X-ray showed left knee periprosthetic fracture.  CT confirmed left knee periprostatic fracture and also showed hemarthrosis and pretibial hematoma measuring 8.6 x 1.7 x 7.5 cm.  Case discussed with orthopedic surgery and she was transferred to [facility] for surgical repair.  Patient underwent ORIF of tibial fracture with extensor mechanism patella tendon repair by Dr. Edna on 12/1.  Ortho recommended WBAT with the knee locked in extension at all times.   Hospital course complicated by ABLA with Hgb dropping from 10.9-6.9.  Hgb stable after 2 units of blood.  She has frequent PVCs on telemetry.  Echocardiogram ordered.  Cardiology consulted.   Subjective: Seen and examined earlier this morning.  No major events overnight or this morning.  No complaints.  Had frequent PVCs on telemetry.  Not symptomatic.   Assessment and plan: Mechanical fall at home-fell off a stepstool while trying to turn off ceiling fan. Left knee periprosthetic fracture due to fall at home -S/p ORIF of tibial fracture with extensor mechanism patella tendon repair by Dr. Edna -Ortho recommended WBAT with the knee locked in extension at all times. -Antibiotics, pain control and VTE prophylaxis per surgery. -Vitamin D  level within normal. -PT/OT commended SNF.   Acute blood  loss anemia: Likely due to hematoma.  Anemia panel without nutritional deficiency. Proximal pretibial hematoma -H&H stable after 2 units. -Continue monitoring  Frequent PVCs/possible syncope: Only had brief syncopal episode when EMS picked up at home.  Telemetry with frequent PVCs but not symptomatic.  EKG with junctional rhythm, RBBB, LAFB and LVH.  RBBB, LAFB and LVH chronic.  TTE in 2023 without significant finding. -Started low-dose metoprolol  12.5 mg twice daily -Check echocardiogram -Cardiology consulted.   Leukocytosis: Likely stress related.  Resolved.   Essential hypertension: Normotensive. - Hydralazine  as needed.   Osteoporosis: On Fosamax at home.  Vitamin D  within normal. -Continue calcium  with vitamin D  here   Hyperlipidemia -Continue with atorvastatin -   GERD -Continue with PPI   Urinary retention: Resolved. - Strict intake and output  Thrombocytopenia: Mild.  Resolved. - Continue monitor  Hyponatremia: Mild.  Improved. - Continue monitor  Increase nutrient needs: Body mass index is 28.16 kg/m. Nutrition Problem: Increased nutrient needs Etiology: hip fracture Signs/Symptoms: estimated needs Interventions: Refer to RD note for recommendations   DVT prophylaxis:  Maintain sequential compression device Start: 04/20/24 1041 SCDs Start: 04/19/24 1334  Code Status: Full code Family Communication: Data patient's daughter at bedside. Level of care: Med-Surg Status is: Inpatient Remains inpatient appropriate because: Left periprosthetic tibial fracture, PVCs   Final disposition: SNF   50 minutes with more than 50% spent in reviewing records, counseling patient/family and coordinating care.  Consultants:  Orthopedic surgery Cardiology  Procedures: 12/1-ORIF of left periprosthetic tibial fracture  Microbiology summarized: None  Objective:  Vitals:   04/22/24 0613 04/22/24 1004 04/22/24 2032 04/23/24 0640  BP: (!) 119/56 110/60 (!) 123/56  123/60  Pulse: 84 89 91 91  Resp: 15 16 17 18   Temp: 97.9 F (36.6 C) 99.7 F (37.6 C) 98.8 F (37.1 C) 98.7 F (37.1 C)  TempSrc: Oral  Oral Oral  SpO2: 96% 95% 98%   Weight:      Height:        Examination:  GENERAL: No apparent distress.  Nontoxic. HEENT: MMM.  Vision and hearing grossly intact.  NECK: Supple.  No apparent JVD.  RESP:  No IWOB.  Fair aeration bilaterally. CVS:  RRR. Heart sounds normal.  ABD/GI/GU: BS+. Abd soft, NTND.  MSK/EXT:  Left knee immobilizer in place. SKIN: no apparent skin lesion or wound NEURO: AA.  Oriented appropriately.  No apparent focal neuro deficit. PSYCH: Calm. Normal affect.   Sch Meds:  Scheduled Meds:  aspirin  EC  81 mg Oral BID   atorvastatin  40 mg Oral Daily   calcium-vitamin D  1 tablet Oral Daily   [START ON 04/24/2024] cefadroxil  500 mg Oral BID   metoprolol  tartrate  12.5 mg Oral BID   multivitamin with minerals  1 tablet Oral Daily   pantoprazole  (PROTONIX ) IV  40 mg Intravenous Q12H   prednisoLONE acetate  1 drop Both Eyes QHS   senna-docusate  1 tablet Oral BID   sodium chloride  flush  10-40 mL Intracatheter Q12H   Continuous Infusions:   ceFAZolin  (ANCEF ) IV 2 g (04/23/24 0519)   PRN Meds:.ALPRAZolam, bisacodyl, hydrALAZINE, HYDROcodone-acetaminophen , HYDROmorphone (DILAUDID) injection, ondansetron , polyethylene glycol, sodium chloride  flush  Antimicrobials: Anti-infectives (From admission, onward)    Start     Dose/Rate Route Frequency Ordered Stop   04/24/24 2200  cefadroxil (DURICEF) capsule 500 mg        500 mg Oral 2 times daily 04/21/24 1802 05/01/24 2159   04/24/24 0000  cefadroxil (DURICEF) 500 MG capsule        500 mg Oral 2 times daily 04/21/24 1755 05/01/24 2359   04/21/24 2200  ceFAZolin  (ANCEF ) IVPB 2g/100 mL premix        2 g 200 mL/hr over 30 Minutes Intravenous Every 8 hours 04/21/24 1802 04/24/24 2159   04/21/24 1444  ceFAZolin  (ANCEF ) 2-4 GM/100ML-% IVPB       Note to Pharmacy: Minor,  Anneita S: cabinet override      04/21/24 1444 04/22/24 0259        I have personally reviewed the following labs and images: CBC: Recent Labs  Lab 04/20/24 0345 04/20/24 1146 04/20/24 1746 04/21/24 0300 04/22/24 0435 04/23/24 0544  WBC 6.2 7.0 7.4 6.7 6.1 6.4  NEUTROABS 3.4  --   --  3.8  --   --   HGB 6.9* 8.1* 8.2* 8.0* 8.6* 8.3*  HCT 21.6* 24.8* 24.8* 24.1* 26.0* 24.9*  MCV 96.9 96.9 95.4 95.6 95.9 95.4  PLT 125* 114* 114* 120* 121* 153   BMP &GFR Recent Labs  Lab 04/19/24 1446 04/20/24 0345 04/21/24 0300 04/22/24 0435 04/23/24 0544  NA 137 133* 135 132* 134*  K 4.6 4.4 4.6 4.8 4.5  CL 105 103 102 98 101  CO2 20* 21* 23 26 26   GLUCOSE 94 114* 114* 108* 122*  BUN 17 17 10 13 13   CREATININE 0.89 0.88 0.82 0.74 0.75  CALCIUM 8.4* 8.0* 8.3* 8.3* 8.4*  MG  --   --   --   --  1.9  PHOS  --   --   --   --  2.8   Estimated Creatinine Clearance: 59.2 mL/min (by C-G formula based on SCr of 0.75 mg/dL). Liver & Pancreas: Recent Labs  Lab 04/19/24 0105 04/19/24 1446 04/20/24 0345 04/21/24 0300 04/23/24 0544  AST 37 30 27 31   --   ALT 23 20 18 19   --   ALKPHOS 59 37* 34* 40  --   BILITOT 0.4 0.6 0.5 0.9  --   PROT 6.7 5.2* 4.7* 4.8*  --   ALBUMIN 4.0 3.0* 2.6* 2.4* 2.8*   No results for input(s): LIPASE, AMYLASE in the last 168 hours. No results for input(s): AMMONIA in the last 168 hours. Diabetic: No results for input(s): HGBA1C in the last 72 hours.  No results for input(s): GLUCAP in the last 168 hours. Cardiac Enzymes: No results for input(s): CKTOTAL, CKMB, CKMBINDEX, TROPONINI in the last 168 hours. No results for input(s): PROBNP in the last 8760 hours. Coagulation Profile: Recent Labs  Lab 04/19/24 0105  INR 1.1   Thyroid  Function Tests: No results for input(s): TSH, T4TOTAL, FREET4, T3FREE, THYROIDAB in the last 72 hours.  Lipid Profile: No results for input(s): CHOL, HDL, LDLCALC, TRIG, CHOLHDL,  LDLDIRECT in the last 72 hours. Anemia Panel: No results for input(s): VITAMINB12, FOLATE, FERRITIN, TIBC, IRON, RETICCTPCT in the last 72 hours.  Urine analysis:    Component Value Date/Time   COLORURINE YELLOW (A) 04/19/2024 1156   APPEARANCEUR CLEAR (A) 04/19/2024 1156   LABSPEC 1.014 04/19/2024 1156   PHURINE 5.0 04/19/2024 1156   GLUCOSEU NEGATIVE 04/19/2024 1156   HGBUR NEGATIVE 04/19/2024 1156   BILIRUBINUR NEGATIVE 04/19/2024 1156   KETONESUR NEGATIVE 04/19/2024 1156   PROTEINUR NEGATIVE 04/19/2024 1156   NITRITE NEGATIVE 04/19/2024 1156   LEUKOCYTESUR NEGATIVE 04/19/2024 1156   Sepsis Labs: Invalid input(s): PROCALCITONIN, LACTICIDVEN  Microbiology: No results found for this or any previous visit (from the past 240 hours).  Radiology Studies: No results found.     Aarik Blank T. Jessica Checketts [facility]  If 7PM-7AM, please contact night-coverage [email or web address] 04/23/2024, 10:03 AM

## 2024-04-23 NOTE — TOC Progression Note (Addendum)
 Transition of Care [facility]) - Progression Note    Patient Details  Name: [name] MRN: [id number] Date of Birth: 1935-09-19  Transition of Care [facility]) CM/SW Contact  Brooke JONELLE Rex, RN Phone Number: 04/23/2024, 10:57 AM  Clinical Narrative:   Met with patient and her daughter , [name], at bedside to introduce role of INPT CM/NCM and review for dc planning, PT recommendation for short term rehab/SNF, patient and dtr agreeable, prefer [place]. FL2 updated, faxed out for bed offers. Daughter request SNF bed offer list be emailed to her at : [email or web address]     Expected Discharge Plan: Skilled Nursing Facility Barriers to Discharge: Continued Medical Work up               Expected Discharge Plan and Services       Living arrangements for the past 2 months: Skilled Nursing Facility                                       Social Drivers of Health (SDOH) Interventions SDOH Screenings   Food Insecurity: No Food Insecurity (04/19/2024)  Housing: Low Risk  (04/20/2024)  Transportation Needs: No Transportation Needs (04/20/2024)  Utilities: Not At Risk (04/19/2024)  Financial Resource Strain: Low Risk  (03/12/2024)   [facility]  Social Connections: Moderately Integrated (04/20/2024)  Tobacco Use: Unknown (04/21/2024)    Readmission Risk Interventions    04/23/2024   10:56 AM  Readmission Risk Prevention Plan  Transportation Screening Complete  PCP or Specialist Appt within 5-7 Days Complete  Home Care Screening Complete  Medication Review (RN CM) Complete

## 2024-04-23 NOTE — Plan of Care (Signed)

## 2024-04-23 NOTE — Progress Notes (Signed)
 Physical Therapy Treatment Patient Details Name: [name] MRN: [id number] DOB: May 08, 1936 Today's Date: 04/23/2024   History of Present Illness 88 yo female  seen at [facility] with L knee pain.   X-ray showed left knee periprosthetic fracture.  CT confirmed left knee periprostatic fracture and also showed hemarthrosis and pretibial hematoma measuring 8.6 x 1.7 x 7.5 cm.  Case discussed with orthopedic surgery and she was transferred to [facility] for surgical repair. S/p ORIF L tibial tubercle fx and extensor mechanism repair 04/21/24.  PMH: thalamic hemorrhage, TMJ, bil TKAs, back surgery    PT Comments   Pt admitted with above diagnosis.  Pt currently with functional limitations due to the deficits listed below (see PT Problem List). Pt in bed when therapist arrived, L bledsoe brace in proper position and locked in extension. Family present. Pt agreeable to therapy intervention and reports feeling better and able to have staff assist Bed <> BSC earlier in the day. Pt required min A x 2 for supine to sit with use of hospital bed features and PT managing L LE, CGA x 2 for sit to stand  from elevated EOB, min A x 2 for sit to stand  from recliner, gait tasks in hallway 90 feet x 2 with seated therapeutic rest break between amb bouts. Pt left seated in recliner and all needs in place, family present. Pt is motivated to d/c home vs SNF for rehab-- pending pt progression with IND and safety with functional mobility task. Pt will benefit from acute skilled PT to increase their independence and safety with mobility to allow discharge.      If plan is discharge home, recommend the following: Two people to help with walking and/or transfers;Help with stairs or ramp for entrance;Assist for transportation;Assistance with cooking/housework;A lot of help with bathing/dressing/bathroom   Can travel by private vehicle     No  Equipment Recommendations  None recommended by PT    Recommendations for Other Services        Precautions / Restrictions Precautions Precautions: Fall Required Braces or Orthoses: Other Brace Knee Immobilizer - Left: On at all times Other Brace: Bledsoe Brace at all times, locked in full extension Restrictions Weight Bearing Restrictions Per Provider Order: Yes LLE Weight Bearing Per Provider Order: Weight bearing as tolerated     Mobility  Bed Mobility Overal bed mobility: Needs Assistance Bed Mobility: Supine to Sit     Supine to sit: Min assist, +2 for physical assistance, HOB elevated     General bed mobility comments: min A with PT encouraging pt to increase IND with pt stating that staff had pulled her forward with the bed pad previously, pt able to weight shift and scoot anteriorly with increased time and min A, PT managing L LE in bledsoe brace to floor    Transfers Overall transfer level: Needs assistance Equipment used: Rolling walker (2 wheels) Transfers: Sit to/from Stand Sit to Stand: Contact guard assist, +2 safety/equipment, Min assist, +2 physical assistance           General transfer comment: CGA x 2 for sit to stand from elevated EOB, with min cues and from recliner min A x 2    Ambulation/Gait Ambulation/Gait assistance: Contact guard assist Gait Distance (Feet): 90 Feet Assistive device: Rolling walker (2 wheels) Gait Pattern/deviations: Step-to pattern, Decreased stance time - left, Trunk flexed, Wide base of support Gait velocity: decreased     General Gait Details: step almost through pattern, minimal B foot clearance, B  toe out, c/o L heel discomfort with WB due to bunion, min cues for posture with sligth flexed trunk with B UE support at RW to offload L LE in stance phase, L bledsoe brace locked in extension at all times, pt required seated therapeutic rest break and HR 87 with exertion, and pt able to return to personal room an additional 90 feet,  pt indicated no increased L knee pain, no SOB   Stairs              Wheelchair Mobility     Tilt Bed    Modified Rankin (Stroke Patients Only)       Balance Overall balance assessment: Needs assistance, History of Falls Sitting-balance support: Feet supported, No upper extremity supported Sitting balance-Leahy Scale: Good     Standing balance support: Reliant on assistive device for balance, Bilateral upper extremity supported, During functional activity Standing balance-Leahy Scale: Poor Standing balance comment: CGA for stabiltiy with B UE support at [employer]                            Communication Communication Communication: No apparent difficulties  Cognition Arousal: Alert Behavior During Therapy: WFL for tasks assessed/performed   PT - Cognitive impairments: No apparent impairments                         Following commands: Intact      Cueing    Exercises      General Comments General comments (skin integrity, edema, etc.): L LE and L ankle edema noted, pt provided with larger sock and donned some slight discoloration to L lateral ankle      Pertinent Vitals/Pain Pain Assessment Pain Assessment: 0-10 Pain Score: 0-No pain (no real pain) Pain Location: left knee    Home Living                          Prior Function            PT Goals (current goals can now be found in the care plan section) Acute Rehab PT Goals Patient Stated Goal: to go home PT Goal Formulation: With patient/family Time For Goal Achievement: 05/06/24 Potential to Achieve Goals: Fair Progress towards PT goals: Progressing toward goals    Frequency    Min 4X/week      PT Plan      Co-evaluation              AM-PAC PT 6 Clicks Mobility   Outcome Measure  Help needed turning from your back to your side while in a flat bed without using bedrails?: A Little Help needed moving from lying on your back to sitting on the side of a flat bed without using bedrails?: A Little Help needed moving to and  from a bed to a chair (including a wheelchair)?: A Little Help needed standing up from a chair using your arms (e.g., wheelchair or bedside chair)?: A Little Help needed to walk in hospital room?: A Little Help needed climbing 3-5 steps with a railing? : Total 6 Click Score: 16    End of Session Equipment Utilized During Treatment: Gait belt;Other (comment) (L Bledsoe brace locked in full extension) Activity Tolerance: Patient tolerated treatment well;No increased pain Patient left: in chair;with call bell/phone within reach;with family/visitor present Nurse Communication: Mobility status PT Visit Diagnosis: Other abnormalities of gait and mobility (R26.89)  Time: 1201-1224 PT Time Calculation (min) (ACUTE ONLY): 23 min  Charges:    $Gait Training: 8-22 mins PT General Charges $$ ACUTE PT VISIT: 1 Visit                     [place], PT Acute Rehab    [place] VEAR Drone 04/23/2024, 2:48 PM

## 2024-04-23 NOTE — Progress Notes (Signed)
    2 Days Post-Op Procedure(s) (LRB): OPEN REDUCTION INTERNAL FIXATION (ORIF) TIBIA/FIBULA FRACTURE (Left)  Subjective:  Patient reports pain as mild.  No issues overnight.  Had difficulty with mobilization yesterday, reports this was due to fatigue.  In good spirits this morning, eager to mobilize.  Reports PT discussed likely SNF upon dispo.  Reiterated importance of keeping the brace on and knee locked in extension at all times.  Denies numbness or tingling of leg.  No other concerns.    Daughter at bedside, has to return home today to [place], step-sibiling in area and brother coming to town on Friday.  Yesterday's total administered Morphine Milligram Equivalents: 5   Objective:   VITALS:   Vitals:   04/22/24 0613 04/22/24 1004 04/22/24 2032 04/23/24 0640  BP: (!) 119/56 110/60 (!) 123/56 123/60  Pulse: 84 89 91 91  Resp: 15 16 17 18   Temp: 97.9 F (36.6 C) 99.7 F (37.6 C) 98.8 F (37.1 C) 98.7 F (37.1 C)  TempSrc: Oral  Oral Oral  SpO2: 96% 95% 98%   Weight:      Height:        Resting in bed comfortably in NAD Sensation intact distally Intact pulses distally Dorsiflexion/Plantar flexion intact Incision: dressing C/D/I Compartment soft Bledsoe brace intact locked in extension Wiggles toes appropriately    Lab Results  Component Value Date   WBC 6.4 04/23/2024   HGB 8.3 (L) 04/23/2024   HCT 24.9 (L) 04/23/2024   MCV 95.4 04/23/2024   PLT 153 04/23/2024   BMET    Component Value Date/Time   NA 134 (L) 04/23/2024 0544   K 4.5 04/23/2024 0544   CL 101 04/23/2024 0544   CO2 26 04/23/2024 0544   GLUCOSE 122 (H) 04/23/2024 0544   BUN 13 04/23/2024 0544   CREATININE 0.75 04/23/2024 0544   CALCIUM 8.4 (L) 04/23/2024 0544   GFRNONAA >60 04/23/2024 0544      Xray: interval reduction of tibial tubercle fx, intact hardware no adverse features  Assessment/Plan: 2 Days Post-Op   Principal Problem:   Fall at home, initial encounter  S/p L  tibial tubercle fx and extensor mechanism repair 04/21/24  Narrative: Hgb waxing and waning between 8.0-9.0, asymptomatic.  Can consider transfusion if drops below 7.0.    Post op recs: WB: Weightbearing as tolerated left lower extremity with the knee locked in extension at all times Imaging: PACU xrays Dressing: Aquacel dressing intact until follow-up DVT prophylaxis: aspirin  81mg  BID x4 weeks Follow up: 2 weeks after surgery for a wound check and suture removal [facility] Dr[facility] Edna at [facility].  Address: [address], [place], [place] [address]  Office Phone: [phone]    Bernarda CHRISTELLA Mclean 04/23/2024, 8:28 AM   Contact information:   Weekdays 7am-5pm epic message Dr. Edna, or call office for patient follow up: [phone] After hours and holidays please check [email or web address] for group call information for [facility]

## 2024-04-24 LAB — RENAL FUNCTION PANEL
Albumin: 2.8 g/dL — ABNORMAL LOW (ref 3.5–5.0)
Anion gap: 7 (ref 5–15)
BUN: 14 mg/dL (ref 8–23)
CO2: 25 mmol/L (ref 22–32)
Calcium: 8.6 mg/dL — ABNORMAL LOW (ref 8.9–10.3)
Chloride: 99 mmol/L (ref 98–111)
Creatinine, Ser: 0.72 mg/dL (ref 0.44–1.00)
GFR, Estimated: >60 mL/min (ref >60)
Glucose, Bld: 107 mg/dL — ABNORMAL HIGH (ref 70–99)
Phosphorus: 2.5 mg/dL (ref 2.5–4.6)
Potassium: 4.7 mmol/L (ref 3.5–5.1)
Sodium: 131 mmol/L — ABNORMAL LOW (ref 135–145)

## 2024-04-24 LAB — MAGNESIUM: Magnesium: 1.9 mg/dL (ref 1.7–2.4)

## 2024-04-24 LAB — CBC
HCT: 25.5 % — ABNORMAL LOW (ref 36.0–46.0)
Hemoglobin: 8.1 g/dL — ABNORMAL LOW (ref 12.0–15.0)
MCH: 31.4 pg (ref 26.0–34.0)
MCHC: 31.8 g/dL (ref 30.0–36.0)
MCV: 98.8 fL (ref 80.0–100.0)
Platelets: 177 K/uL (ref 150–400)
RBC: 2.58 MIL/uL — ABNORMAL LOW (ref 3.87–5.11)
RDW: 14 % (ref 11.5–15.5)
WBC: 6.6 K/uL (ref 4.0–10.5)
nRBC: 0 % (ref 0.0–0.2)

## 2024-04-24 MED ORDER — METOPROLOL TARTRATE 25 MG PO TABS: 12.5000 mg | ORAL_TABLET | Freq: Two times a day (BID) | ORAL | Status: DC

## 2024-04-24 MED ORDER — SENNOSIDES-DOCUSATE SODIUM 8.6-50 MG PO TABS: 1.0000 | ORAL_TABLET | Freq: Two times a day (BID) | ORAL | Status: AC | PRN

## 2024-04-24 MED ORDER — ALPRAZOLAM 0.25 MG PO TABS: 0.2500 mg | ORAL_TABLET | Freq: Every evening | ORAL | 0 refills | Status: AC | PRN

## 2024-04-24 NOTE — TOC Progression Note (Addendum)
 Transition of Care [facility]) - Progression Note    Patient Details  Name: [name] MRN: [id number] Date of Birth: [birth year]  [facility]) CM/SW Contact  Alfonse JONELLE Rex, RN Phone Number: 04/24/2024, 11:39 AM  Clinical Narrative:   Met with patient and family at bedside to review SNF bed offers [facility], [facility], [facility], [facility], [facility]), list left with family to review. Call received from patient's dtr Carlin, confirmed accepted bed offer at [facility]. Rosaline, admit coordinator w/SNF notified. Call to [facility], sw Jori, SNF and non emergency ambulance authorization initiated, await determination,     Expected Discharge Plan: Skilled Nursing Facility Barriers to Discharge: Continued Medical Work up               Expected Discharge Plan and Services       Living arrangements for the past 2 months: Skilled Nursing Facility Expected Discharge Date: 04/24/24                                     Social Drivers of Health (SDOH) Interventions SDOH Screenings   Food Insecurity: No Food Insecurity (04/19/2024)  Housing: Low Risk  (04/20/2024)  Transportation Needs: No Transportation Needs (04/20/2024)  Utilities: Not At Risk (04/19/2024)  Financial Resource Strain: Low Risk  (03/12/2024)   Received from [facility]  Social Connections: Moderately Integrated (04/20/2024)  Tobacco Use: Unknown (04/21/2024)    Readmission Risk Interventions    04/23/2024   10:56 AM  Readmission Risk Prevention Plan  Transportation Screening Complete  PCP or Specialist Appt within 5-7 Days Complete  Home Care Screening Complete  Medication Review (RN CM) Complete

## 2024-04-24 NOTE — Discharge Summary (Signed)
 Physician Discharge Summary  [name] FMW:[id number] DOB: [birth year] DOA: 04/19/2024  PCP: Epifanio Alm SQUIBB, MD  Admit date: 04/19/2024 Discharge date: 04/24/24  Admitted From: Home Disposition: SNF Recommendations for Outpatient Follow-up:  Outpatient follow-up with orthopedic surgery as below Cardiology to arrange heart monitor and outpatient follow-up Check CMP and CBC at follow-up Please follow up on the following pending results: None  Discharge Condition: Stable CODE STATUS: Full code Diet Orders (From admission, onward)     Start     Ordered   04/21/24 1803  Diet regular Room service appropriate? Yes; Fluid consistency: Thin  Diet effective now       Question Answer Comment  Room service appropriate? Yes   Fluid consistency: Thin      04/21/24 1802             Contact information for follow-up providers     Edna Toribio LABOR, MD Follow up in 2 week(s).   Specialty: Orthopedic Surgery Contact information: [address] [place] [address] [phone]              Contact information for after-discharge care     Destination     [facility] .   Service: Skilled Nursing Contact information: [address] [place]  [address] [phone]                     Hospital course 88 year old F with PMH of HTN and osteoporosis brought to [facility] ED by EMS with left knee pain after she fell off a stepstool and fell on her left knee.  She noted swelling and inability to bear weight.  Reportedly had brief syncopal episode when EMS got her up.    In ED, stable vitals.  WBC 15.1.  Initial EKG with junctional rhythm, RBBB, LAFB and LVH.  X-ray showed left knee periprosthetic fracture.  CT confirmed left knee periprostatic fracture and also showed hemarthrosis and pretibial hematoma measuring 8.6 x 1.7 x 7.5 cm.  Case discussed with orthopedic surgery and she was transferred to [facility]  for surgical repair.   Patient underwent ORIF of tibial fracture with extensor mechanism patella tendon repair by Dr. Edna on 12/1.  Ortho recommended WBAT with the knee locked in extension at all times.    Hospital course complicated by ABLA with Hgb dropping from 10.9-6.9.  Hgb stable after 2 units of blood.  She has frequent PVCs on telemetry.  Started on low-dose metoprolol .  Echocardiogram without significant finding.  Cardiology to arrange heart monitor and follow-up.  Therapy recommended SNF.  Outpatient follow-up with orthopedic surgery as above.  See individual problem list below for more.   Problems addressed during this hospitalization Mechanical fall at home-fell off a stepstool while trying to turn off ceiling fan. Left knee periprosthetic fracture due to fall at home -S/p ORIF of tibial fracture with extensor mechanism patella tendon repair by Dr. Edna -Ortho recommended WBAT with the knee locked in extension at all times. -Antibiotics, pain control and VTE prophylaxis per surgery.  See below. -Vitamin D level within normal. -PT/OT commended SNF.   Acute blood loss anemia: Likely due to hematoma.  Anemia panel without nutritional deficiency. Proximal pretibial hematoma -H&H stable after 2 units. -Check CBC in 1 week.   Frequent PVCs/possible syncope: Only had brief syncopal episode when EMS picked up at home.  Telemetry with frequent PVCs but not symptomatic.  EKG with junctional rhythm, RBBB, LAFB and  LVH, unchanged. Echocardiogram without significant finding. -Started low-dose metoprolol  12.5 mg twice daily -Cardiology to arrange heart monitor.  Patient is followed at [facility] clinic   Leukocytosis: Likely stress related.  Resolved.   Essential hypertension: Normotensive. - Started low-dose metoprolol  mainly for PVC. -Discontinued losartan.   Osteoporosis: On Fosamax at home.  Vitamin D  within normal. -Continue calcium  with vitamin D  here    Hyperlipidemia -Continue with atorvastatin -   GERD -Continue with PPI   Urinary retention: Resolved.   Thrombocytopenia: Mild.  Resolved.   Hyponatremia: Mild.  Stable. - Recheck in 1 week.   Increase nutrient needs: Body mass index is 28.16 kg/m. Nutrition Problem: Increased nutrient needs Etiology: hip fracture Signs/Symptoms: estimated needs Interventions: Refer to RD note for recommendations     Consultations: Orthopedic surgery Cardiology  Time spent 35  minutes  Vital signs Vitals:   04/23/24 0640 04/23/24 1254 04/23/24 1938 04/24/24 0457  BP: 123/60 95/83 120/62 (!) 110/53  Pulse: 91 78 78 69  Temp: 98.7 F (37.1 C) 97.8 F (36.6 C) 99.1 F (37.3 C) 98.4 F (36.9 C)  Resp: 18 16 16 14   Height:      Weight:      SpO2:  95% 98% 97%  TempSrc: Oral  Oral Oral  BMI (Calculated):         Discharge exam  GENERAL: No apparent distress.  Nontoxic. HEENT: MMM.  Vision and hearing grossly intact.  NECK: Supple.  No apparent JVD.  RESP:  No IWOB.  Fair aeration bilaterally. CVS:  RRR. Heart sounds normal.  ABD/GI/GU: BS+. Abd soft, NTND.  MSK/EXT:  Moves extremities.  Left knee immobilizer in place. SKIN: no apparent skin lesion or wound NEURO: Awake and alert. Oriented appropriately.  No apparent focal neuro deficit. PSYCH: Calm. Normal affect.   Discharge Instructions Discharge Instructions     Increase activity slowly   Complete by: As directed    No wound care   Complete by: As directed       Allergies as of 04/24/2024       Reactions   Tramadol-acetaminophen  Nausea Only   Other reaction(s): Other (See Comments) AMS        Medication List     STOP taking these medications    losartan 25 MG tablet Commonly known as: COZAAR   simvastatin  40 MG tablet Commonly known as: ZOCOR    vitamin A 8000 UNIT capsule   vitamin E 180 MG (400 UNITS) capsule       TAKE these medications    acetaminophen  500 MG tablet Commonly known  as: TYLENOL  Take 2 tablets (1,000 mg total) by mouth every 8 (eight) hours as needed. What changed:  when to take this reasons to take this   alendronate 70 MG tablet Commonly known as: FOSAMAX Take 70 mg by mouth once a week.   ALPRAZolam  0.25 MG tablet Commonly known as: XANAX  Take 1 tablet (0.25 mg total) by mouth at bedtime as needed.   aspirin  EC 81 MG tablet Take 1 tablet (81 mg total) by mouth 2 (two) times daily for 28 days. Swallow whole. What changed: when to take this   atorvastatin  40 MG tablet Commonly known as: LIPITOR Take 40 mg by mouth daily.   b complex vitamins capsule Take 1 capsule by mouth daily.   Calcium  Citrate-Vitamin D  315-5 MG-MCG Tabs Take 1 tablet by mouth daily.   cefadroxil  500 MG capsule Commonly known as: DURICEF Take 1 capsule (500 mg total) by mouth 2 (  two) times daily for 7 days.   Co Q 10 100 MG Caps Take 100 mg by mouth daily.   esomeprazole 40 MG capsule Commonly known as: NEXIUM Take 1 capsule by mouth daily.   furosemide 20 MG tablet Commonly known as: LASIX TAKE 1 TABLET BY MOUTH DAILY AS NEEDED FOR EDEMA   hydrocortisone  2.5 % cream Apply topically every morning. 5 days per week as needed for active rash   ketoconazole  2 % shampoo Commonly known as: NIZORAL  apply once  per week, massage into scalp and leave in for 10 minutes before rinsing out   ketoconazole  2 % cream Commonly known as: NIZORAL  Apply 1 Application topically every evening.   methocarbamol 500 MG tablet Commonly known as: ROBAXIN Take 1 tablet (500 mg total) by mouth every 8 (eight) hours as needed for up to 10 days for muscle spasms.   multivitamin with minerals tablet Take 1 tablet by mouth daily. What changed: Another medication with the same name was removed. Continue taking this medication, and follow the directions you see here.   ondansetron  4 MG tablet Commonly known as: Zofran  Take 1 tablet (4 mg total) by mouth every 8 (eight) hours as  needed for up to 14 days for nausea or vomiting.   oxyCODONE 5 MG immediate release tablet Commonly known as: Roxicodone Take 1 tablet (5 mg total) by mouth every 4 (four) hours as needed for up to 7 days for severe pain (pain score 7-10) or moderate pain (pain score 4-6).   polyethylene glycol 17 g packet Commonly known as: MiraLax Take 17 g by mouth daily.   prednisoLONE acetate 1 % ophthalmic suspension Commonly known as: PRED FORTE Place 1 drop into both eyes at bedtime.   senna-docusate 8.6-50 MG tablet Commonly known as: Senokot-S Take 1 tablet by mouth 2 (two) times daily between meals as needed for mild constipation.         Procedures/Studies: 12/1-ORIF of left periprosthetic tibial fracture    ECHOCARDIOGRAM COMPLETE Result Date: 04/23/2024    ECHOCARDIOGRAM REPORT   Patient Name:   [name] Date of Exam: 04/23/2024 Medical Rec #:  [id number]         Height:       69.0 in Accession #:    [id number]        Weight:       190.7 lb Date of Birth:  04-03-1936         BSA:          2.025 m Patient Age:    88 years          BP:           95/83 mmHg Patient Gender: F                 HR:           74 bpm. Exam Location:  Inpatient Procedure: 2D Echo, Cardiac Doppler and Color Doppler (Both Spectral and Color            Flow Doppler were utilized during procedure). Indications:    R00.8 Other abnormalities of heart beat  History:        Patient has prior history of Echocardiogram examinations, most                 recent 11/27/2015. Stroke,                 Signs/Symptoms:Dizziness/Lightheadedness; Risk  Factors:Hypertension and Dyslipidemia.  Sonographer:    Ellouise Mose RDCS Referring Phys: [id number] MIGNON DASEN [name] IMPRESSIONS  1. Left ventricular ejection fraction, by estimation, is 60 to 65%. Left ventricular ejection fraction by 2D MOD biplane is 66.6 %. The left ventricle has normal function. The left ventricle has no regional wall motion abnormalities. Left ventricular  diastolic parameters were normal.  2. Right ventricular systolic function is normal. The right ventricular size is normal.  3. The mitral valve is degenerative. Trivial mitral valve regurgitation. No evidence of mitral stenosis.  4. The aortic valve is tricuspid. Aortic valve regurgitation is not visualized. No aortic stenosis is present.  5. The inferior vena cava is dilated in size with <50% respiratory variability, suggesting right atrial pressure of 15 mmHg. Comparison(s): No prior Echocardiogram. FINDINGS  Left Ventricle: Left ventricular ejection fraction, by estimation, is 60 to 65%. Left ventricular ejection fraction by 2D MOD biplane is 66.6 %. The left ventricle has normal function. The left ventricle has no regional wall motion abnormalities. The left ventricular internal cavity size was normal in size. There is no left ventricular hypertrophy. Left ventricular diastolic parameters were normal. Right Ventricle: The right ventricular size is normal. No increase in right ventricular wall thickness. Right ventricular systolic function is normal. Left Atrium: Left atrial size was normal in size. Right Atrium: Right atrial size was normal in size. Pericardium: There is no evidence of pericardial effusion. Mitral Valve: The mitral valve is degenerative in appearance. Mild mitral annular calcification. Trivial mitral valve regurgitation. No evidence of mitral valve stenosis. Tricuspid Valve: The tricuspid valve is normal in structure. Tricuspid valve regurgitation is mild . No evidence of tricuspid stenosis. Aortic Valve: The aortic valve is tricuspid. Aortic valve regurgitation is not visualized. No aortic stenosis is present. Pulmonic Valve: The pulmonic valve was normal in structure. Pulmonic valve regurgitation is mild. No evidence of pulmonic stenosis. Aorta: The aortic root and ascending aorta are structurally normal, with no evidence of dilitation. Venous: A systolic blunting flow pattern is recorded from  the right upper pulmonary vein. The inferior vena cava is dilated in size with less than 50% respiratory variability, suggesting right atrial pressure of 15 mmHg. IAS/Shunts: No atrial level shunt detected by color flow Doppler.  LEFT VENTRICLE PLAX 2D                        Biplane EF (MOD) LVIDd:         4.05 cm         LV Biplane EF:   Left LVIDs:         2.80 cm                          ventricular LV PW:         1.10 cm                          ejection LV IVS:        1.25 cm                          fraction by LVOT diam:     2.30 cm                          2D MOD LV SV:  95                               biplane is LV SV Index:   47                               66.6 %. LVOT Area:     4.15 cm LV IVRT:       74 msec         Diastology                                LV e' medial:    7.18 cm/s                                LV E/e' medial:  12.2 LV Volumes (MOD)               LV e' lateral:   12.70 cm/s LV vol d, MOD    70.5 ml       LV E/e' lateral: 6.9 A2C: LV vol d, MOD    69.8 ml A4C: LV vol s, MOD    29.8 ml A2C: LV vol s, MOD    18.6 ml A4C: LV SV MOD A2C:   40.7 ml LV SV MOD A4C:   69.8 ml LV SV MOD BP:    46.8 ml RIGHT VENTRICLE            IVC RV S prime:     8.81 cm/s  IVC diam: 3.10 cm TAPSE (M-mode): 1.9 cm                            PULMONARY VEINS                            Diastolic Velocity: 50.00 cm/s                            S/D Velocity:       1.00                            Systolic Velocity:  50.90 cm/s LEFT ATRIUM           Index        RIGHT ATRIUM           Index LA diam:      4.40 cm 2.17 cm/m   RA Area:     19.70 cm LA Vol (A4C): 43.1 ml 21.29 ml/m  RA Volume:   61.90 ml  30.57 ml/m  AORTIC VALVE             PULMONIC VALVE LVOT Vmax:   119.00 cm/s PR End Diast Vel: 2.32 msec LVOT Vmean:  81.400 cm/s LVOT VTI:    0.228 m  AORTA Ao Root diam: 3.30 cm Ao Asc diam:  3.30 cm MITRAL VALVE               TRICUSPID VALVE MV Area (PHT): 3.42 cm    TR Peak grad:   55.1 mmHg MV Decel  Time: 222 msec    TR  Vmax:        371.00 cm/s MV E velocity: 87.35 cm/s MV A velocity: 89.25 cm/s  SHUNTS MV E/A ratio:  0.98        Systemic VTI:  0.23 m                            Systemic Diam: 2.30 cm Joelle Cedars Tonleu Electronically signed by Joelle Cedars Tonleu Signature Date/Time: 04/23/2024/4:35:08 PM    Final    DG Knee Left Port Result Date: 04/21/2024 EXAM: 1 or 2 VIEW(S) XRAY OF THE LEFT KNEE 04/21/2024 06:22:00 PM COMPARISON: 04/19/2024 CLINICAL HISTORY: Post-operative state FINDINGS: BONES AND JOINTS: Left total knee arthroplasty and lateral sideplate and screw fixation of the proximal left tibia in place. No acute fracture. No focal osseous lesion. No joint dislocation. No significant joint effusion. SOFT TISSUES: Expected soft tissue gas and edema. Vascular calcifications present. IMPRESSION: 1. Left total knee arthroplasty and lateral sideplate and screw fixation of the proximal left tibia in place. Expected postoperative soft tissue gas and edema. Vascular calcifications noted. Electronically signed by: Franky Stanford MD 04/21/2024 10:11 PM EST RP Workstation: [id number]   DG Knee 1-2 Views Left Result Date: 04/21/2024 EXAM: XR Left Knee Total Arthroplasty, 1 or 2 Views TECHNIQUE: Fluoroscopy was provided by the radiology department for procedure. Radiologist was not present during examination. FLUOROSCOPY DOSE AND TYPE: Fluoroscopy time 12 seconds. Radiation Dose Index: Reference Air Kerma (in mGy) =1.0 COMPARISON: None available. CLINICAL HISTORY: Elective surgery. FINDINGS: Intraoperative fluoroscopic imaging of left knee total arthroplasty was performed. IMPRESSION: 1. Intraoperative fluoroscopic spot images of left knee total arthroplasty performed. NOTE: Intraoperative fluoroscopic spot images as above. Please refer to the intraoperative report for full details. Electronically signed by: Franky Stanford MD 04/21/2024 10:10 PM EST RP Workstation: [id number]   DG C-Arm 1-60 Min-No  Report Result Date: 04/21/2024 Fluoroscopy was utilized by the requesting physician.  No radiographic interpretation.   DG C-Arm 1-60 Min-No Report Result Date: 04/21/2024 Fluoroscopy was utilized by the requesting physician.  No radiographic interpretation.   CT Knee Left Wo Contrast Result Date: 04/19/2024 EXAM: CT LEFT KNEE, WITHOUT IV CONTRAST 04/19/2024 04:25:37 AM TECHNIQUE: Axial images were acquired through the left knee without IV contrast. Reformatted images were reviewed. There is extensive metallic streak artifact from a total joint arthroplasty, limiting the study. Automated exposure control, iterative reconstruction, and/or weight based adjustment of the mA/kV was utilized to reduce the radiation dose to as low as reasonably achievable. COMPARISON: 4 views knee series earlier today. CLINICAL HISTORY: Knee trauma, tibial plateau fracture (Age >= 5y); Periprosthetic left anterior proximal tibial fracture, orthopedics requests additional evaluation with CT scan. FINDINGS: Technical limitations: There is extensive spray artifact related to metal hardware from a total joint arthroplasty. This significantly limits fine detail. BONES: There is a periprosthetic fracture about the tibial hardware involving the anterior proximal tibial metaphysis. The largest of the anterior fragments is displaced a few mm cephalad relative to the parent bone with slight upward rotation. No other displaced fracture is seen. There is generalized osteopenia. JOINTS: No joint dislocation. There are a number of small calcifications in the anterior joint space, which do not appear related to the fracture. There is a hemarthrosis in the suprapatellar bursal space also seen with scattered calcifications within the hemorrhagic fluid. SOFT TISSUES: Diffuse stranding edema seen in soft tissues. There is a sizable proximal pretibial hematoma associated with the injury, maximum diameter of 8.6 cm  transverse, 1.7 cm AP and 7.5 cm  craniocaudal. No deep space collection is seen through the metal artifacts. There does appear to be a grossly normal muscle bulk for an 88 year old. The popliteal trifurcation arteries show near circumferential moderate to heavy calcifications. The area tendons obscured by the metallic artifacts. IMPRESSION: 1. Periprosthetic fracture about the tibial hardware involving the anterior proximal tibial metaphysis, with the largest anterior fragment displaced a few millimeters cephalad with slight upward rotation. 2. Hemarthrosis in the suprapatellar bursal space with scattered calcifications within the hemorrhagic fluid. The calcifications are probably chronic. 3. Sizable proximal pretibial hematoma associated with the injury, measuring 8.6 x 1.7 x 7.5 cm. 4. Extensive metallic streak artifact from a total joint arthroplasty that limits evaluation. Electronically signed by: Francis Quam MD 04/19/2024 04:55 AM EST RP Workstation: [id number]   DG Chest Port 1 View Result Date: 04/19/2024 EXAM: 1 VIEW(S) XRAY OF THE CHEST 04/19/2024 01:48:00 AM COMPARISON: None available. CLINICAL HISTORY: Pre-op. FINDINGS: LUNGS AND PLEURA: Low lung volumes. No focal pulmonary opacity. No pleural effusion. No pneumothorax. HEART AND MEDIASTINUM: Aortic atherosclerosis. No acute abnormality of the cardiac and mediastinal silhouettes. BONES AND SOFT TISSUES: Partially imaged lumbar fusion hardware. Degenerative changes of right shoulder with calcific tendinopathy. Left shoulder arthroplasty noted. IMPRESSION: 1. No acute cardiopulmonary abnormality detected. Electronically signed by: Norman Gatlin MD 04/19/2024 01:55 AM EST RP Workstation: [id number]   DG Thoracic Spine 2 View Result Date: 04/19/2024 EXAM: 2 VIEW(S) XRAY OF THE THORACIC SPINE 04/19/2024 01:48:00 AM COMPARISON: None available. CLINICAL HISTORY: pain/contusion after fall FINDINGS: BONES: Demineralization. No acute fracture. No aggressive appearing osseous lesion.  Alignment is normal. DISCS AND DEGENERATIVE CHANGES: Multilevel age commensurate spondylosis. SOFT TISSUES: The visualized lungs are clear. IMPRESSION: 1. No acute abnormality of the thoracic spine. Electronically signed by: Norman Gatlin MD 04/19/2024 01:55 AM EST RP Workstation: [id number]   DG Hip Unilat W or Wo Pelvis 2-3 Views Left Result Date: 04/19/2024 EXAM: 2 OR MORE VIEW(S) XRAY OF THE PELVIS AND LEFT HIP 04/19/2024 01:48:00 AM COMPARISON: None available. CLINICAL HISTORY: pain/contusion after fall FINDINGS: BONES AND JOINTS: SI joints are symmetric. No acute fracture. Bilateral hips demonstrate normal alignment. Mild degenerative changes of bilateral hip joints characterized by mild joint space narrowing and osteophytosis of the superior acetabulum. LUMBAR SPINE: Partially seen lumbar spine fusion hardware. SOFT TISSUES: Vascular calcifications noted. IMPRESSION: 1. No acute abnormality of the left hip. Electronically signed by: Norman Gatlin MD 04/19/2024 01:53 AM EST RP Workstation: [id number]   DG Knee Complete 4 Views Left Result Date: 04/19/2024 EXAM: 4 VIEW(S) XRAY OF THE LEFT KNEE 04/19/2024 01:48:00 AM COMPARISON: None available. CLINICAL HISTORY: [id number] Injury [id number] FINDINGS: BONES AND JOINTS: Total knee arthroplasty in place. Displaced periprosthetic fracture of proximal anterior tibia. No joint dislocation. No significant joint effusion. SOFT TISSUES: Subcutaneous soft tissue edema. Vascular calcifications. IMPRESSION: 1. Displaced periprosthetic fracture of proximal anterior tibia. Electronically signed by: Norman Gatlin MD 04/19/2024 01:52 AM EST RP Workstation: [id number]       The results of significant diagnostics from this hospitalization (including imaging, microbiology, ancillary and laboratory) are listed below for reference.     Microbiology: No results found for this or any previous visit (from the past 240 hours).   Labs:  CBC: Recent Labs  Lab  04/20/24 0345 04/20/24 1146 04/20/24 1746 04/21/24 0300 04/22/24 0435 04/23/24 0544 04/24/24 0341  WBC 6.2   < > 7.4 6.7 6.1 6.4 6.6  NEUTROABS 3.4  --   --  3.8  --   --   --  HGB 6.9*   < > 8.2* 8.0* 8.6* 8.3* 8.1*  HCT 21.6*   < > 24.8* 24.1* 26.0* 24.9* 25.5*  MCV 96.9   < > 95.4 95.6 95.9 95.4 98.8  PLT 125*   < > 114* 120* 121* 153 177   < > = values in this interval not displayed.   BMP &GFR Recent Labs  Lab 04/20/24 0345 04/21/24 0300 04/22/24 0435 04/23/24 0544 04/24/24 0341  NA 133* 135 132* 134* 131*  K 4.4 4.6 4.8 4.5 4.7  CL 103 102 98 101 99  CO2 21* 23 26 26 25   GLUCOSE 114* 114* 108* 122* 107*  BUN 17 10 13 13 14   CREATININE 0.88 0.82 0.74 0.75 0.72  CALCIUM  8.0* 8.3* 8.3* 8.4* 8.6*  MG  --   --   --  1.9 1.9  PHOS  --   --   --  2.8 2.5   Estimated Creatinine Clearance: 59.2 mL/min (by C-G formula based on SCr of 0.72 mg/dL). Liver & Pancreas: Recent Labs  Lab 04/19/24 0105 04/19/24 1446 04/20/24 0345 04/21/24 0300 04/23/24 0544 04/24/24 0341  AST 37 30 27 31   --   --   ALT 23 20 18 19   --   --   ALKPHOS 59 37* 34* 40  --   --   BILITOT 0.4 0.6 0.5 0.9  --   --   PROT 6.7 5.2* 4.7* 4.8*  --   --   ALBUMIN 4.0 3.0* 2.6* 2.4* 2.8* 2.8*   No results for input(s): LIPASE, AMYLASE in the last 168 hours. No results for input(s): AMMONIA in the last 168 hours. Diabetic: No results for input(s): HGBA1C in the last 72 hours. No results for input(s): GLUCAP in the last 168 hours. Cardiac Enzymes: No results for input(s): CKTOTAL, CKMB, CKMBINDEX, TROPONINI in the last 168 hours. No results for input(s): PROBNP in the last 8760 hours. Coagulation Profile: Recent Labs  Lab 04/19/24 0105  INR 1.1   Thyroid  Function Tests: No results for input(s): TSH, T4TOTAL, FREET4, T3FREE, THYROIDAB in the last 72 hours. Lipid Profile: No results for input(s): CHOL, HDL, LDLCALC, TRIG, CHOLHDL, LDLDIRECT in the last  72 hours. Anemia Panel: No results for input(s): VITAMINB12, FOLATE, FERRITIN, TIBC, IRON, RETICCTPCT in the last 72 hours. Urine analysis:    Component Value Date/Time   COLORURINE YELLOW (A) 04/19/2024 1156   APPEARANCEUR CLEAR (A) 04/19/2024 1156   LABSPEC 1.014 04/19/2024 1156   PHURINE 5.0 04/19/2024 1156   GLUCOSEU NEGATIVE 04/19/2024 1156   HGBUR NEGATIVE 04/19/2024 1156   BILIRUBINUR NEGATIVE 04/19/2024 1156   KETONESUR NEGATIVE 04/19/2024 1156   PROTEINUR NEGATIVE 04/19/2024 1156   NITRITE NEGATIVE 04/19/2024 1156   LEUKOCYTESUR NEGATIVE 04/19/2024 1156   Sepsis Labs: Invalid input(s): PROCALCITONIN, LACTICIDVEN   SIGNED:  Mignon ONEIDA Bump, MD  [facility] 04/24/2024, 11:04 AM

## 2024-04-24 NOTE — Plan of Care (Signed)
  Problem: Education: Goal: Knowledge of General Education information will improve Description: Including pain rating scale, medication(s)/side effects and non-pharmacologic comfort measures Outcome: Adequate for Discharge   Problem: Health Behavior/Discharge Planning: Goal: Ability to manage health-related needs will improve Outcome: Adequate for Discharge   Problem: Clinical Measurements: Goal: Ability to maintain clinical measurements within normal limits will improve Outcome: Progressing Goal: Will remain free from infection Outcome: Progressing Goal: Diagnostic test results will improve Outcome: Adequate for Discharge Goal: Respiratory complications will improve Outcome: Progressing Goal: Cardiovascular complication will be avoided Outcome: Adequate for Discharge   Problem: Activity: Goal: Risk for activity intolerance will decrease Outcome: Adequate for Discharge   Problem: Nutrition: Goal: Adequate nutrition will be maintained Outcome: Completed/Met   Problem: Coping: Goal: Level of anxiety will decrease Outcome: Progressing   Problem: Elimination: Goal: Will not experience complications related to bowel motility Outcome: Completed/Met Goal: Will not experience complications related to urinary retention Outcome: Completed/Met   Problem: Pain Managment: Goal: General experience of comfort will improve and/or be controlled Outcome: Progressing   Problem: Safety: Goal: Ability to remain free from injury will improve Outcome: Adequate for Discharge   Problem: Skin Integrity: Goal: Risk for impaired skin integrity will decrease Outcome: Adequate for Discharge   Problem: Education: Goal: Verbalization of understanding the information provided (i.e., activity precautions, restrictions, etc) will improve Outcome: Adequate for Discharge Goal: Individualized Educational Video(s) Outcome: Completed/Met   Problem: Activity: Goal: Ability to ambulate and perform  ADLs will improve Outcome: Adequate for Discharge   Problem: Clinical Measurements: Goal: Postoperative complications will be avoided or minimized Outcome: Progressing   Problem: Self-Concept: Goal: Ability to maintain and perform role responsibilities to the fullest extent possible will improve Outcome: Adequate for Discharge   Problem: Pain Management: Goal: Pain level will decrease Outcome: Progressing

## 2024-04-24 NOTE — Progress Notes (Signed)
   Chart reviewed -echo performed yesterday.  This showed reassuring LVEF 60 to 65% with normal diastolic function.  Trivial mitral regurgitation and elevated right atrial pressure.  Based on these findings I think it is reassuring that her episode was likely vasovagal/orthostatic in the setting of intense pain with recent fracture as well as bleeding associated with the fracture.  We will arrange for follow-up in [facility] and consider an outpatient monitor at the discretion of the follow-up provider.  To DC home today from a cardiac standpoint.  Vinie KYM Maxcy, MD, [facility], FNLA, FACP  [place]  [facility]  Medical Director of the Advanced Lipid Disorders &  Cardiovascular Risk Reduction Clinic Diplomate of the American Board of Clinical Lipidology Attending Cardiologist  Direct Dial: [phone]  Fax: [phone]  Website:  [email or web address]

## 2024-04-24 NOTE — Plan of Care (Signed)
  Problem: Education: Goal: Knowledge of General Education information will improve Description: Including pain rating scale, medication(s)/side effects and non-pharmacologic comfort measures Outcome: Progressing   Problem: Activity: Goal: Risk for activity intolerance will decrease Outcome: Progressing   Problem: Nutrition: Goal: Adequate nutrition will be maintained Outcome: Progressing   Problem: Elimination: Goal: Will not experience complications related to urinary retention Outcome: Progressing   Problem: Pain Managment: Goal: General experience of comfort will improve and/or be controlled Outcome: Progressing

## 2024-04-24 NOTE — Care Management Important Message (Signed)
 Important Message  Patient Details IM Letter given. Name: [name] MRN: [id number] Date of Birth: 1935-07-06   Important Message Given:  Yes - Medicare IM     Jerilyn Gillaspie 04/24/2024, 10:14 AM

## 2024-04-25 NOTE — Progress Notes (Signed)
    4 Days Post-Op Procedure(s) (LRB): OPEN REDUCTION INTERNAL FIXATION (ORIF) TIBIA/FIBULA FRACTURE (Left)  Subjective:  Patient reports pain as mild.  No issues overnight.  Cardiology also following.  No reported concerns from orthopedic standpoint.  Reiterated importance of keeping the brace on and knee locked in extension at all times.  Denies numbness or tingling of leg.  Possibly dispo to SNF today depending on bed availability.  No FM at bedside, daughter in [place], step-sibiling in area and brother coming to town hopefully today.  Yesterday's total administered Morphine  Milligram Equivalents: 45   Objective:   VITALS:   Vitals:   04/24/24 0457 04/24/24 1429 04/24/24 1930 04/25/24 0530  BP: (!) 110/53 (!) 114/57 (!) 115/52 (!) 127/54  Pulse: 69 76 87 71  Resp: 14  16   Temp: 98.4 F (36.9 C) 99.1 F (37.3 C) 98 F (36.7 C) 100.1 F (37.8 C)  TempSrc: Oral  Oral Oral  SpO2: 97% 97% 99% 99%  Weight:      Height:        Resting in bed comfortably in NAD Sensation intact distally Intact pulses distally Dorsiflexion/Plantar flexion intact Incision: dressing C/D/I Compartment soft Bledsoe brace intact locked in extension Wiggles toes appropriately    Lab Results  Component Value Date   WBC 6.6 04/24/2024   HGB 8.1 (L) 04/24/2024   HCT 25.5 (L) 04/24/2024   MCV 98.8 04/24/2024   PLT 177 04/24/2024   BMET    Component Value Date/Time   NA 131 (L) 04/24/2024 0341   K 4.7 04/24/2024 0341   CL 99 04/24/2024 0341   CO2 25 04/24/2024 0341   GLUCOSE 107 (H) 04/24/2024 0341   BUN 14 04/24/2024 0341   CREATININE 0.72 04/24/2024 0341   CALCIUM  8.6 (L) 04/24/2024 0341   GFRNONAA >60 04/24/2024 0341      Xray: interval reduction of tibial tubercle fx, intact hardware no adverse features  Assessment/Plan: 4 Days Post-Op   Principal Problem:   Fall at home, initial encounter Active Problems:   Vasovagal syncope   Orthostatic hypotension   PVC's  (premature ventricular contractions)  S/p L tibial tubercle fx and extensor mechanism repair 04/21/24  Narrative: Hgb waxing and waning between 8.0-9.0, asymptomatic.  Can consider transfusion if drops below 7.0.    Post op recs: WB: Weightbearing as tolerated left lower extremity with the knee locked in extension at all times Imaging: PACU xrays Dressing: Aquacel dressing intact until follow-up DVT prophylaxis: aspirin  81mg  BID x4 weeks Dispo: Ok to discharge from orthopedic standpoint Follow up: 2 weeks after surgery for a wound check and suture removal with Dr. Edna at [facility].  Address: [address], [place], [place] [address]  Office Phone: [phone]    Bernarda CHRISTELLA Mclean 04/25/2024, 6:59 AM   Contact information:   Weekdays 7am-5pm epic message Dr. Edna, or call office for patient follow up: [phone] After hours and holidays please check [email or web address] for group call information for [facility]

## 2024-04-25 NOTE — TOC Progression Note (Addendum)
 Transition of Care [facility]) - Progression Note    Patient Details  Name: [name] MRN: [id number] Date of Birth: Jun 04, 1935  [facility] CM/SW Contact  Alfonse JONELLE Rex, RN Phone Number: 04/25/2024, 3:14 PM  Clinical Narrative:   Call received from [facility] with [employer], SNF request approved for initial 7 days, patient has 5 business days to admit. Auth# [id number]. Request for non emergency ambulance going to medical review . NCM met with patient at bedside, patient called her daughter, [name], on her mobile speaker phone, NCM updated on SNF auth and denial of non emergency ambulance, patient and [name] would like to proceed with medical review for ambulance, await determination.  Rosaline w/[place] , admit coordinator, confirmed patient able to admit tomorrow.   -3:53pm Call received from [facility] with [employer], non emergency ambulance approved, Auth# [id number], team notifed, plan for transfer tomorrow.     Expected Discharge Plan: Skilled Nursing Facility Barriers to Discharge: Continued Medical Work up               Expected Discharge Plan and Services       Living arrangements for the past 2 months: Skilled Nursing Facility Expected Discharge Date: 04/24/24                                     Social Drivers of Health (SDOH) Interventions SDOH Screenings   Food Insecurity: No Food Insecurity (04/19/2024)  Housing: Low Risk  (04/20/2024)  Transportation Needs: No Transportation Needs (04/20/2024)  Utilities: Not At Risk (04/19/2024)  Financial Resource Strain: Low Risk  (03/12/2024)   Received from [facility]  Social Connections: Moderately Integrated (04/20/2024)  Tobacco Use: Unknown (04/21/2024)    Readmission Risk Interventions    04/23/2024   10:56 AM  Readmission Risk Prevention Plan  Transportation Screening Complete  PCP or Specialist Appt within 5-7 Days Complete  Home Care Screening Complete   Medication Review (RN CM) Complete

## 2024-04-25 NOTE — Plan of Care (Signed)

## 2024-04-25 NOTE — Progress Notes (Signed)
 PROGRESS NOTE  [name] FMW:[id number] DOB: 09-29-1935   PCP: Epifanio Alm SQUIBB, MD  Patient is from: Home.  Lives alone.  Has wheelchair, rolling walker and cane that she has not been using  DOA: 04/19/2024 LOS: 6  Chief complaints Knee pain    Brief Narrative / Interim history: 88 year old F with PMH of HTN and osteoporosis brought to [facility] ED by EMS with left knee pain after she fell off a stepstool and fell on her left knee.  She noted swelling and inability to bear weight.  Reportedly had brief syncopal episode when EMS got her up.   In ED, stable vitals.  WBC 15.1.  Initial EKG with junctional rhythm, RBBB, LAFB and LVH.  X-ray showed left knee periprosthetic fracture.  CT confirmed left knee periprostatic fracture and also showed hemarthrosis and pretibial hematoma measuring 8.6 x 1.7 x 7.5 cm.  Case discussed with orthopedic surgery and she was transferred to [facility] for surgical repair.  Patient underwent ORIF of tibial fracture with extensor mechanism patella tendon repair by Dr. Edna on 12/1.  Ortho recommended WBAT with the knee locked in extension at all times.   Hospital course complicated by ABLA with Hgb dropping from 10.9-6.9.  Hgb stable after 2 units of blood.  She has frequent PVCs on telemetry.  Echocardiogram ordered.  Cardiology consulted.  Echocardiogram has demonstrated EF of 60-65% with normal function and no regional wall motion abnormalities. LV diastolic parameters were normal. RV systolic function was normal. RV size was normal. There were no significant valvular abnormalities.  Subjective: The patient was resting in bed with her family at her side. No new complaints.   Assessment and plan: Mechanical fall at home-fell off a stepstool while trying to turn off ceiling fan. Left knee periprosthetic fracture due to fall at home -S/p ORIF of tibial fracture with extensor mechanism patella tendon repair by Dr. Edna -Ortho recommended WBAT with  the knee locked in extension at all times. -Antibiotics, pain control and VTE prophylaxis per surgery. -Vitamin D  level within normal. -PT/OT recommended SNF.   Acute blood loss anemia: Likely due to hematoma.  Anemia panel without nutritional deficiency. Proximal pretibial hematoma -H&H stable after 2 units. -Continue monitoring  Frequent PVCs/possible syncope: Only had brief syncopal episode when EMS picked up at home.  Telemetry with frequent PVCs but not symptomatic.  EKG with junctional rhythm, RBBB, LAFB and LVH.  RBBB, LAFB and LVH chronic.  TTE in 2023 without significant finding. -Started low-dose metoprolol  12.5 mg twice daily - Echocardiogram has demonstrated EF of 60-65% with normal function and no regional wall motion abnormalities. LV diastolic parameters were normal. RV systolic function was normal. RV size was normal. There were no significant valvular abnormalities. -Cardiology consulted. They feel that episode was likely due to vasovagal physiology in the setting of orthostasis.. No further investigation needed.   Leukocytosis: Likely stress related.  Resolved.   Essential hypertension: Normotensive. - Hydralazine  as needed.   Osteoporosis: On Fosamax at home.  Vitamin D  within normal. -Continue calcium  with vitamin D  here   Hyperlipidemia -Continue with atorvastatin -   GERD -Continue with PPI   Urinary retention: Resolved. - Strict intake and output  Thrombocytopenia: Mild.  Resolved. - Continue monitor  Hyponatremia: Mild.  Improved. - Continue monitor  Increase nutrient needs: Body mass index is 28.16 kg/m. Nutrition Problem: Increased nutrient needs Etiology: hip fracture Signs/Symptoms: estimated needs Interventions: Refer to RD note for recommendations   DVT prophylaxis:  Maintain sequential compression device  Start: 04/20/24 1041 SCDs Start: 04/19/24 1334  Code Status: Full code Family Communication: Data patient's daughter at  bedside. Level of care: Med-Surg Status is: Inpatient Remains inpatient appropriate because: Left periprosthetic tibial fracture, PVCs   Final disposition: SNF   40 minutes with more than 50% spent in reviewing records, counseling patient/family and coordinating care.  Consultants:  Orthopedic surgery Cardiology  Procedures: 12/1-ORIF of left periprosthetic tibial fracture  Microbiology summarized: None  Objective: Vitals:   04/24/24 1930 04/25/24 0530 04/25/24 0929 04/25/24 1320  BP: (!) 115/52 (!) 127/54 (!) 122/50 (!) 112/49  Pulse: 87 71 77 71  Resp: 16  17 16   Temp: 98 F (36.7 C) 100.1 F (37.8 C) 98.7 F (37.1 C) 99.1 F (37.3 C)  TempSrc: Oral Oral Oral   SpO2: 99% 99% 94% 98%  Weight:      Height:        Examination:  Exam:  Constitutional:  The patient is awake, alert, and oriented x 3. No acute distress. Respiratory:  No increased work of breathing. No wheezes, rales, or rhonchi No tactile fremitus Cardiovascular:  Regular rate and rhythm No murmurs, ectopy, or gallups. No lateral PMI. No thrills. Abdomen:  Abdomen is soft, non-tender, non-distended No hernias, masses, or organomegaly Normoactive bowel sounds.  Musculoskeletal:  No cyanosis, clubbing, or edema Skin:  No rashes, lesions, ulcers palpation of skin: no induration or nodules Neurologic:  CN 2-12 intact Sensation all 4 extremities intact Psychiatric:  Mental status Mood, affect appropriate Orientation to person, place, time  judgment and insight appear intact   Sch Meds:  Scheduled Meds:  aspirin  EC  81 mg Oral BID   atorvastatin   40 mg Oral Daily   calcium -vitamin D   1 tablet Oral Daily   cefadroxil   500 mg Oral BID   metoprolol  tartrate  12.5 mg Oral BID   multivitamin with minerals  1 tablet Oral Daily   pantoprazole   40 mg Oral Daily   prednisoLONE  acetate  1 drop Both Eyes QHS   senna-docusate  1 tablet Oral BID   sodium chloride  flush  10-40 mL Intracatheter  Q12H   Continuous Infusions:   PRN Meds:.ALPRAZolam , bisacodyl , hydrALAZINE , HYDROcodone -acetaminophen , HYDROmorphone  (DILAUDID ) injection, ondansetron , polyethylene glycol, sodium chloride  flush  Antimicrobials: Anti-infectives (From admission, onward)    Start     Dose/Rate Route Frequency Ordered Stop   04/24/24 2200  cefadroxil  (DURICEF) capsule 500 mg        500 mg Oral 2 times daily 04/21/24 1802 05/01/24 2159   04/24/24 0000  cefadroxil  (DURICEF) 500 MG capsule        500 mg Oral 2 times daily 04/21/24 1755 05/01/24 2359   04/21/24 2200  ceFAZolin  (ANCEF ) IVPB 2g/100 mL premix        2 g 200 mL/hr over 30 Minutes Intravenous Every 8 hours 04/21/24 1802 04/24/24 1614   04/21/24 1444  ceFAZolin  (ANCEF ) 2-4 GM/100ML-% IVPB       Note to Pharmacy: Minor, Anneita S: cabinet override      04/21/24 1444 04/22/24 0259        I have personally reviewed the following labs and images: CBC: Recent Labs  Lab 04/20/24 0345 04/20/24 1146 04/20/24 1746 04/21/24 0300 04/22/24 0435 04/23/24 0544 04/24/24 0341  WBC 6.2   < > 7.4 6.7 6.1 6.4 6.6  NEUTROABS 3.4  --   --  3.8  --   --   --   HGB 6.9*   < > 8.2* 8.0* 8.6*  8.3* 8.1*  HCT 21.6*   < > 24.8* 24.1* 26.0* 24.9* 25.5*  MCV 96.9   < > 95.4 95.6 95.9 95.4 98.8  PLT 125*   < > 114* 120* 121* 153 177   < > = values in this interval not displayed.   BMP &GFR Recent Labs  Lab 04/20/24 0345 04/21/24 0300 04/22/24 0435 04/23/24 0544 04/24/24 0341  NA 133* 135 132* 134* 131*  K 4.4 4.6 4.8 4.5 4.7  CL 103 102 98 101 99  CO2 21* 23 26 26 25   GLUCOSE 114* 114* 108* 122* 107*  BUN 17 10 13 13 14   CREATININE 0.88 0.82 0.74 0.75 0.72  CALCIUM  8.0* 8.3* 8.3* 8.4* 8.6*  MG  --   --   --  1.9 1.9  PHOS  --   --   --  2.8 2.5   Estimated Creatinine Clearance: 59.2 mL/min (by C-G formula based on SCr of 0.72 mg/dL). Liver & Pancreas: Recent Labs  Lab 04/19/24 0105 04/19/24 1446 04/20/24 0345 04/21/24 0300 04/23/24 0544  04/24/24 0341  AST 37 30 27 31   --   --   ALT 23 20 18 19   --   --   ALKPHOS 59 37* 34* 40  --   --   BILITOT 0.4 0.6 0.5 0.9  --   --   PROT 6.7 5.2* 4.7* 4.8*  --   --   ALBUMIN 4.0 3.0* 2.6* 2.4* 2.8* 2.8*   No results for input(s): LIPASE, AMYLASE in the last 168 hours. No results for input(s): AMMONIA in the last 168 hours. Diabetic: No results for input(s): HGBA1C in the last 72 hours.  No results for input(s): GLUCAP in the last 168 hours. Cardiac Enzymes: No results for input(s): CKTOTAL, CKMB, CKMBINDEX, TROPONINI in the last 168 hours. No results for input(s): PROBNP in the last 8760 hours. Coagulation Profile: Recent Labs  Lab 04/19/24 0105  INR 1.1   Thyroid  Function Tests: No results for input(s): TSH, T4TOTAL, FREET4, T3FREE, THYROIDAB in the last 72 hours.  Lipid Profile: No results for input(s): CHOL, HDL, LDLCALC, TRIG, CHOLHDL, LDLDIRECT in the last 72 hours. Anemia Panel: No results for input(s): VITAMINB12, FOLATE, FERRITIN, TIBC, IRON, RETICCTPCT in the last 72 hours.  Urine analysis:    Component Value Date/Time   COLORURINE YELLOW (A) 04/19/2024 1156   APPEARANCEUR CLEAR (A) 04/19/2024 1156   LABSPEC 1.014 04/19/2024 1156   PHURINE 5.0 04/19/2024 1156   GLUCOSEU NEGATIVE 04/19/2024 1156   HGBUR NEGATIVE 04/19/2024 1156   BILIRUBINUR NEGATIVE 04/19/2024 1156   KETONESUR NEGATIVE 04/19/2024 1156   PROTEINUR NEGATIVE 04/19/2024 1156   NITRITE NEGATIVE 04/19/2024 1156   LEUKOCYTESUR NEGATIVE 04/19/2024 1156   Sepsis Labs: Invalid input(s): PROCALCITONIN, LACTICIDVEN  Microbiology: No results found for this or any previous visit (from the past 240 hours).  Radiology Studies: No results found.  Elyssia Strausser, DO [facility]  If 7PM-7AM, please contact night-coverage [email or web address] 04/25/2024, 4:34 PM

## 2024-04-25 NOTE — Progress Notes (Signed)
 Physical Therapy Treatment Patient Details Name: [name] MRN: [id number] DOB: 19-Nov-1935 Today's Date: 04/25/2024   History of Present Illness 88 yo female  seen at [facility] with L knee pain.   X-ray showed left knee periprosthetic fracture.  CT confirmed left knee periprostatic fracture and also showed hemarthrosis and pretibial hematoma measuring 8.6 x 1.7 x 7.5 cm.  Case discussed with orthopedic surgery and she was transferred to [facility] for surgical repair. S/p ORIF L tibial tubercle fx and extensor mechanism repair 04/21/24.  PMH: thalamic hemorrhage, TMJ, bil TKAs, back surgery    PT Comments  POD # 4 Cognition Comments: AxO x 3 pleasant and motivated Lady who lives home alone and was IND/Driving.  Assisted with amb was difficult.  General transfer comment: Increased assist to rise from lower recliner level as well as VC's safety with turns and to extend L LE prior to sit. General Gait Details: Required increased time and assist for balance esp with turns and VC's on proper walker to self distance.  Caution to L foot/toes catching on back walker legs due to slight external hip rotaion, hip ABD due to knee brace prohibiting ant knee flexion.  Throws off my balance, stated Pt.  Increased assist for back steps for balance.  HIGH FALL RISK. Then returned to room to perform some TE's L LE.  Instructed on proper tech, freq as well as use of ICE.  Pt c/o increased pain at distal L LE last strap.  Removed brace and ACE wrap for pressure sore/skin inspection.  No skin breakdown but area is red and welted.  Applied an ABD pad for increase cuschion and reapplied ACE and brace with caution/full support to ensure NO knee ROM.    LPT has rec Pt will need ST Rehab at SNF to address mobility and functional decline prior to safely returning home.      If plan is discharge home, recommend the following: Two people to help with walking and/or transfers;Help with stairs or ramp for entrance;Assist for  transportation;Assistance with cooking/housework;A lot of help with bathing/dressing/bathroom   Can travel by private vehicle     No  Equipment Recommendations  None recommended by PT    Recommendations for Other Services       Precautions / Restrictions Precautions Precautions: Fall Required Braces or Orthoses: Other Brace Knee Immobilizer - Left: On at all times Other Brace: Bledsoe Brace at all times, locked in full extension Restrictions Weight Bearing Restrictions Per Provider Order: No LLE Weight Bearing Per Provider Order: Weight bearing as tolerated     Mobility  Bed Mobility               General bed mobility comments: OOB in recliner    Transfers Overall transfer level: Needs assistance Equipment used: Rolling walker (2 wheels) Transfers: Sit to/from Stand Sit to Stand: Min assist, Mod assist           General transfer comment: Increased assist to rise from lower recliner level as well as VC's safety with turns and to extend L LE prior to sit.    Ambulation/Gait Ambulation/Gait assistance: Contact guard assist, Min assist Gait Distance (Feet): 43 Feet Assistive device: Rolling walker (2 wheels) Gait Pattern/deviations: Step-to pattern, Decreased stance time - left, Trunk flexed, Wide base of support Gait velocity: decreased     General Gait Details: Required increased time and assist for balance esp with turns and VC's on proper walker to self distance.  Caution to L foot/toes catching  on back walker legs due to slight external hip rotaion, hip ABD due to knee brace prohibiting ant knee flexion.  Throws off my balance, stated Pt.  Increased assist for back steps for balance.  HIGH FALL RISK.   Stairs             Wheelchair Mobility     Tilt Bed    Modified Rankin (Stroke Patients Only)       Balance                                            Communication Communication Communication: No apparent  difficulties  Cognition Arousal: Alert Behavior During Therapy: WFL for tasks assessed/performed   PT - Cognitive impairments: No apparent impairments                       PT - Cognition Comments: AxO x 3 pleasant and motivated Lady who lives home alone and was IND/Driving. Following commands: Intact      Cueing Cueing Techniques: Verbal cues, Gestural cues  Exercises  20 reps B LE AP  20 reps B LE knee presses  10 reps towel squeezes  10 reps L hip ABd/ADd with strap for AAROM  10 reps L SLR with strap for AAROM    General Comments        Pertinent Vitals/Pain Pain Assessment Pain Assessment: 0-10 Pain Score: 7  Pain Location: left knee Pain Descriptors / Indicators: Sore, Grimacing, Guarding, Operative site guarding Pain Intervention(s): Monitored during session, Premedicated before session, Repositioned, Ice applied    Home Living                          Prior Function            PT Goals (current goals can now be found in the care plan section) Progress towards PT goals: Progressing toward goals    Frequency    Min 4X/week      PT Plan      Co-evaluation              AM-PAC PT 6 Clicks Mobility   Outcome Measure  Help needed turning from your back to your side while in a flat bed without using bedrails?: A Lot Help needed moving from lying on your back to sitting on the side of a flat bed without using bedrails?: A Lot Help needed moving to and from a bed to a chair (including a wheelchair)?: A Lot Help needed standing up from a chair using your arms (e.g., wheelchair or bedside chair)?: A Lot Help needed to walk in hospital room?: A Lot Help needed climbing 3-5 steps with a railing? : Total 6 Click Score: 11    End of Session Equipment Utilized During Treatment: Gait belt Activity Tolerance: Patient limited by fatigue Patient left: in chair;with call bell/phone within reach;with family/visitor present Nurse  Communication: Mobility status PT Visit Diagnosis: Other abnormalities of gait and mobility (R26.89)     Time: 8841-8767 PT Time Calculation (min) (ACUTE ONLY): 34 min  Charges:    $Gait Training: 8-22 mins $Therapeutic Exercise: 8-22 mins PT General Charges $$ ACUTE PT VISIT: 1 Visit                    Katheryn Leap  PTA Acute  Rehabilitation Services  Office M-F          [phone]

## 2024-04-26 NOTE — Progress Notes (Deleted)
 Mobility Specialist Progress Note:   04/26/24 1246  Mobility  Activity Pivoted/transferred to/from [facility]  Level of Assistance Minimal assist, patient does 75% or more  Assistive Device Front wheel walker  Distance Ambulated (ft) 2 ft  LLE Weight Bearing Per Provider Order WBAT  Activity Response Tolerated well  Mobility Referral Yes  Mobility visit 1 Mobility  Mobility Specialist Start Time (ACUTE ONLY) 1212  Mobility Specialist Stop Time (ACUTE ONLY) 1230  Mobility Specialist Time Calculation (min) (ACUTE ONLY) 18 min   Pt requested to use BSC. Min A sit to stand. Returned to bed with all needs met. Call bell in reach.  [employer] - Mobility Specialist

## 2024-04-26 NOTE — TOC Transition Note (Signed)
 Transition of Care [facility]) - Discharge Note   Patient Details  Name: [name] MRN: [id number] Date of Birth: 04-Feb-1936  Transition of Care [facility] CM/SW Contact:  Sonda Manuella Quill, RN Phone Number: 04/26/2024, 12:17 PM   Clinical Narrative:    D/C orders received; spoke w/ Darrien at [facility]; she gave RM # [phone], call report # [phone]; transport by [facility]; notified pt and son [name] at bedside; they agree to d/c plan; [facility] called for transport at 1214; spoke w/ operator [phone]; SNF transfer report and d/c summary sent via SNF hub; no IP CM needs.   Final next level of care: Skilled Nursing Facility Barriers to Discharge: No Barriers Identified   Patient Goals and CMS Choice Patient states their goals for this hospitalization and ongoing recovery are:: return home CMS Medicare.gov Compare Post Acute Care list provided to:: Patient Choice offered to / list presented to : Patient [place] ownership interest in [facility].provided to:: Patient    Discharge Placement              Patient chooses bed at: [facility] Patient to be transferred to facility by: PTAR Name of family member notified: [name] (son) at bedside Patient and family notified of of transfer: 04/26/24  Discharge Plan and Services Additional resources added to the After Visit Summary for                  DME Arranged: N/A DME Agency: NA       HH Arranged: NA HH Agency: NA        Social Drivers of Health (SDOH) Interventions SDOH Screenings   Food Insecurity: No Food Insecurity (04/19/2024)  Housing: Low Risk  (04/20/2024)  Transportation Needs: No Transportation Needs (04/20/2024)  Utilities: Not At Risk (04/19/2024)  Financial Resource Strain: Low Risk  (03/12/2024)   Received from [facility]  Social Connections: Moderately Integrated (04/20/2024)  Tobacco Use: Unknown (04/21/2024)     Readmission Risk Interventions     04/23/2024   10:56 AM  Readmission Risk Prevention Plan  Transportation Screening Complete  PCP or Specialist Appt within 5-7 Days Complete  Home Care Screening Complete  Medication Review (RN CM) Complete

## 2024-04-26 NOTE — Plan of Care (Signed)
  Problem: Education: Goal: Knowledge of General Education information will improve Description: Including pain rating scale, medication(s)/side effects and non-pharmacologic comfort measures Outcome: Adequate for Discharge   Problem: Health Behavior/Discharge Planning: Goal: Ability to manage health-related needs will improve Outcome: Progressing   Problem: Clinical Measurements: Goal: Ability to maintain clinical measurements within normal limits will improve Outcome: Progressing Goal: Will remain free from infection Outcome: Progressing Goal: Diagnostic test results will improve Outcome: Progressing Goal: Respiratory complications will improve Outcome: Adequate for Discharge Goal: Cardiovascular complication will be avoided Outcome: Adequate for Discharge   Problem: Activity: Goal: Risk for activity intolerance will decrease Outcome: Adequate for Discharge   Problem: Coping: Goal: Level of anxiety will decrease Outcome: Progressing   Problem: Pain Managment: Goal: General experience of comfort will improve and/or be controlled Outcome: Adequate for Discharge   Problem: Safety: Goal: Ability to remain free from injury will improve Outcome: Progressing   Problem: Skin Integrity: Goal: Risk for impaired skin integrity will decrease Outcome: Progressing   Problem: Education: Goal: Verbalization of understanding the information provided (i.e., activity precautions, restrictions, etc) will improve Outcome: Progressing   Problem: Activity: Goal: Ability to ambulate and perform ADLs will improve Outcome: Adequate for Discharge   Problem: Clinical Measurements: Goal: Postoperative complications will be avoided or minimized Outcome: Adequate for Discharge   Problem: Self-Concept: Goal: Ability to maintain and perform role responsibilities to the fullest extent possible will improve Outcome: Adequate for Discharge   Problem: Pain Management: Goal: Pain level will  decrease Outcome: Adequate for Discharge

## 2024-04-26 NOTE — Progress Notes (Signed)
 Mobility Specialist Progress Note:   04/26/24 1246  Mobility  Activity Pivoted/transferred to/from [facility]  Level of Assistance Minimal assist, patient does 75% or more  Assistive Device Front wheel walker  Distance Ambulated (ft) 2 ft  LLE Weight Bearing Per Provider Order WBAT  Activity Response Tolerated well  Mobility visit 1 Mobility  Mobility Specialist Start Time (ACUTE ONLY) 1212  Mobility Specialist Stop Time (ACUTE ONLY) 1230  Mobility Specialist Time Calculation (min) (ACUTE ONLY) 18 min   Pt was received in bed and requested to use BSC. Min A sit to stand. Returned to bed with all needs met. Call bell in reach.  [employer] - Mobility Specialist

## 2024-04-26 NOTE — Progress Notes (Signed)
 Report given to nurse constance with Emmalene Hertz., PTAR has been called to give report.

## 2024-04-26 NOTE — Discharge Summary (Signed)
 Physician Discharge Summary   Patient: [name] MRN: [id number] DOB: 1936/04/19  Admit date:     04/19/2024  Discharge date:   Discharge Physician: Brigida Bureau   PCP: Epifanio Alm SQUIBB, MD   Recommendations at discharge:    Outpatient follow-up with orthopedic surgery as below Cardiology to arrange heart monitor and outpatient follow-up Check CMP and CBC at follow-up Please follow up on the following pending results: None  Discharge Diagnoses: Principal Problem:   Fall at home, initial encounter Active Problems:   Vasovagal syncope   Orthostatic hypotension   PVC's (premature ventricular contractions)  Resolved Problems:   * No resolved hospital problems. *  Hospital Course: 88 year old F with PMH of HTN and osteoporosis brought to [facility] ED by EMS with left knee pain after she fell off a stepstool and fell on her left knee.  She noted swelling and inability to bear weight.  Reportedly had brief syncopal episode when EMS got her up.    In ED, stable vitals.  WBC 15.1.  Initial EKG with junctional rhythm, RBBB, LAFB and LVH.  X-ray showed left knee periprosthetic fracture.  CT confirmed left knee periprostatic fracture and also showed hemarthrosis and pretibial hematoma measuring 8.6 x 1.7 x 7.5 cm.  Case discussed with orthopedic surgery and she was transferred to [facility] for surgical repair.   Patient underwent ORIF of tibial fracture with extensor mechanism patella tendon repair by Dr. Edna on 12/1.  Ortho recommended WBAT with the knee locked in extension at all times.    Hospital course complicated by ABLA with Hgb dropping from 10.9-6.9.  Hgb stable after 2 units of blood.  She has frequent PVCs on telemetry.  Started on low-dose metoprolol .  Echocardiogram without significant finding.  Cardiology to arrange heart monitor and follow-up.   Therapy recommended SNF.  Outpatient follow-up with orthopedic surgery as above.  Assessment and Plan: Mechanical fall at  home-fell off a stepstool while trying to turn off ceiling fan. Left knee periprosthetic fracture due to fall at home -S/p ORIF of tibial fracture with extensor mechanism patella tendon repair by Dr. Edna -Ortho recommended WBAT with the knee locked in extension at all times. -Antibiotics, pain control and VTE prophylaxis per surgery.  See below. -Vitamin D  level within normal. -PT/OT commended SNF.   Acute blood loss anemia: Likely due to hematoma.  Anemia panel without nutritional deficiency. Proximal pretibial hematoma -H&H stable after 2 units. -Check CBC in 1 week.   Frequent PVCs/possible syncope: Only had brief syncopal episode when EMS picked up at home.  Telemetry with frequent PVCs but not symptomatic.  EKG with junctional rhythm, RBBB, LAFB and LVH, unchanged. Echocardiogram without significant finding. -Started low-dose metoprolol  12.5 mg twice daily -Cardiology to arrange heart monitor.  Patient is followed at [facility] clinic   Leukocytosis: Likely stress related.  Resolved.   Essential hypertension: Normotensive. - Started low-dose metoprolol  mainly for PVC. -Discontinued losartan.   Osteoporosis: On Fosamax at home.  Vitamin D  within normal. -Continue calcium  with vitamin D  here   Hyperlipidemia -Continue with atorvastatin -   GERD -Continue with PPI   Urinary retention: Resolved.   Thrombocytopenia: Mild.  Resolved.   Hyponatremia: Mild.  Stable. - Recheck in 1 week.   Increase nutrient needs: Body mass index is 28.16 kg/m. Nutrition Problem: Increased nutrient needs Etiology: hip fracture Signs/Symptoms: estimated needs Interventions: Refer to RD note for recommendations     Consultations: Orthopedic surgery Cardiology  Procedures performed: ORIF of tibial fracture 12/1  Disposition: Skilled nursing facility Diet recommendation:  Cardiac diet DISCHARGE MEDICATION: Allergies as of 04/26/2024       Reactions   Tramadol-acetaminophen   Nausea Only   Other reaction(s): Other (See Comments) AMS        Medication List     STOP taking these medications    losartan 25 MG tablet Commonly known as: COZAAR   simvastatin  40 MG tablet Commonly known as: ZOCOR    vitamin A 8000 UNIT capsule   vitamin E 180 MG (400 UNITS) capsule       TAKE these medications    acetaminophen  500 MG tablet Commonly known as: TYLENOL  Take 2 tablets (1,000 mg total) by mouth every 8 (eight) hours as needed. What changed:  when to take this reasons to take this   alendronate 70 MG tablet Commonly known as: FOSAMAX Take 70 mg by mouth once a week.   ALPRAZolam  0.25 MG tablet Commonly known as: XANAX  Take 1 tablet (0.25 mg total) by mouth at bedtime as needed.   aspirin  EC 81 MG tablet Take 1 tablet (81 mg total) by mouth 2 (two) times daily for 28 days. Swallow whole. What changed: when to take this   atorvastatin  40 MG tablet Commonly known as: LIPITOR Take 40 mg by mouth daily.   b complex vitamins capsule Take 1 capsule by mouth daily.   Calcium  Citrate-Vitamin D  315-5 MG-MCG Tabs Take 1 tablet by mouth daily.   cefadroxil  500 MG capsule Commonly known as: DURICEF Take 1 capsule (500 mg total) by mouth 2 (two) times daily for 7 days.   Co Q 10 100 MG Caps Take 100 mg by mouth daily.   esomeprazole 40 MG capsule Commonly known as: NEXIUM Take 1 capsule by mouth daily.   furosemide  20 MG tablet Commonly known as: LASIX  TAKE 1 TABLET BY MOUTH DAILY AS NEEDED FOR EDEMA   hydrocortisone  2.5 % cream Apply topically every morning. 5 days per week as needed for active rash   ketoconazole  2 % shampoo Commonly known as: NIZORAL  apply once  per week, massage into scalp and leave in for 10 minutes before rinsing out   ketoconazole  2 % cream Commonly known as: NIZORAL  Apply 1 Application topically every evening.   methocarbamol  500 MG tablet Commonly known as: ROBAXIN  Take 1 tablet (500 mg total) by mouth  every 8 (eight) hours as needed for up to 10 days for muscle spasms.   metoprolol  tartrate 25 MG tablet Commonly known as: LOPRESSOR  Take 0.5 tablets (12.5 mg total) by mouth 2 (two) times daily.   multivitamin with minerals tablet Take 1 tablet by mouth daily. What changed: Another medication with the same name was removed. Continue taking this medication, and follow the directions you see here.   ondansetron  4 MG tablet Commonly known as: Zofran  Take 1 tablet (4 mg total) by mouth every 8 (eight) hours as needed for up to 14 days for nausea or vomiting.   oxyCODONE  5 MG immediate release tablet Commonly known as: Roxicodone  Take 1 tablet (5 mg total) by mouth every 4 (four) hours as needed for up to 7 days for severe pain (pain score 7-10) or moderate pain (pain score 4-6).   polyethylene glycol 17 g packet Commonly known as: MiraLax  Take 17 g by mouth daily.   prednisoLONE  acetate 1 % ophthalmic suspension Commonly known as: PRED FORTE  Place 1 drop into both eyes at bedtime.   senna-docusate 8.6-50 MG tablet Commonly known as: Senokot-S Take 1 tablet  by mouth 2 (two) times daily between meals as needed for mild constipation.        Contact information for follow-up providers     Edna Toribio LABOR, MD Follow up in 2 week(s).   Specialty: Orthopedic Surgery Contact information: [address] [place] [address] [phone]              Contact information for after-discharge care     Destination     [facility] .   Service: Skilled Nursing Contact information: [address] [place]   [address] [phone]                    Discharge Exam: Fredricka Weights   04/19/24 1536 04/21/24 1437  Weight: 86.5 kg 86.5 kg   Exam:  Constitutional:  The patient is awake, alert, and oriented x 3. No acute distress. Eyes:  pupils and irises appear normal Normal lids and conjunctivae ENMT:   grossly normal hearing  Lips appear normal external ears, nose appear normal Oropharynx: mucosa, tongue,posterior pharynx appear normal Neck:  neck appears normal, no masses, normal ROM, supple no thyromegaly Respiratory:  No increased work of breathing. No wheezes, rales, or rhonchi No tactile fremitus Cardiovascular:  Regular rate and rhythm No murmurs, ectopy, or gallups. No lateral PMI. No thrills. Abdomen:  Abdomen is soft, non-tender, non-distended No hernias, masses, or organomegaly Normoactive bowel sounds.  Musculoskeletal:  No cyanosis, clubbing, or edema Skin:  No rashes, lesions, ulcers palpation of skin: no induration or nodules Neurologic:  CN 2-12 intact Sensation all 4 extremities intact Psychiatric:  Mental status Mood, affect appropriate Orientation to person, place, time  judgment and insight appear intact   Condition at discharge: fair  The results of significant diagnostics from this hospitalization (including imaging, microbiology, ancillary and laboratory) are listed below for reference.   Imaging Studies: ECHOCARDIOGRAM COMPLETE Result Date: 04/23/2024    ECHOCARDIOGRAM REPORT   Patient Name:   TRYSTIN HARGROVE Date of Exam: 04/23/2024 Medical Rec #:  [id number]         Height:       69.0 in Accession #:    [id number]        Weight:       190.7 lb Date of Birth:  31-May-1935         BSA:          2.025 m Patient Age:    88 years          BP:           95/83 mmHg Patient Gender: F                 HR:           74 bpm. Exam Location:  Inpatient Procedure: 2D Echo, Cardiac Doppler and Color Doppler (Both Spectral and Color            Flow Doppler were utilized during procedure). Indications:    R00.8 Other abnormalities of heart beat  History:        Patient has prior history of Echocardiogram examinations, most                 recent 11/27/2015. Stroke,                 Signs/Symptoms:Dizziness/Lightheadedness; Risk                 Factors:Hypertension and  Dyslipidemia.  Sonographer:  Ellouise Mose RDCS Referring Phys: [id number] MIGNON DASEN GONFA IMPRESSIONS  1. Left ventricular ejection fraction, by estimation, is 60 to 65%. Left ventricular ejection fraction by 2D MOD biplane is 66.6 %. The left ventricle has normal function. The left ventricle has no regional wall motion abnormalities. Left ventricular diastolic parameters were normal.  2. Right ventricular systolic function is normal. The right ventricular size is normal.  3. The mitral valve is degenerative. Trivial mitral valve regurgitation. No evidence of mitral stenosis.  4. The aortic valve is tricuspid. Aortic valve regurgitation is not visualized. No aortic stenosis is present.  5. The inferior vena cava is dilated in size with <50% respiratory variability, suggesting right atrial pressure of 15 mmHg. Comparison(s): No prior Echocardiogram. FINDINGS  Left Ventricle: Left ventricular ejection fraction, by estimation, is 60 to 65%. Left ventricular ejection fraction by 2D MOD biplane is 66.6 %. The left ventricle has normal function. The left ventricle has no regional wall motion abnormalities. The left ventricular internal cavity size was normal in size. There is no left ventricular hypertrophy. Left ventricular diastolic parameters were normal. Right Ventricle: The right ventricular size is normal. No increase in right ventricular wall thickness. Right ventricular systolic function is normal. Left Atrium: Left atrial size was normal in size. Right Atrium: Right atrial size was normal in size. Pericardium: There is no evidence of pericardial effusion. Mitral Valve: The mitral valve is degenerative in appearance. Mild mitral annular calcification. Trivial mitral valve regurgitation. No evidence of mitral valve stenosis. Tricuspid Valve: The tricuspid valve is normal in structure. Tricuspid valve regurgitation is mild . No evidence of tricuspid stenosis. Aortic Valve: The aortic valve is tricuspid. Aortic valve  regurgitation is not visualized. No aortic stenosis is present. Pulmonic Valve: The pulmonic valve was normal in structure. Pulmonic valve regurgitation is mild. No evidence of pulmonic stenosis. Aorta: The aortic root and ascending aorta are structurally normal, with no evidence of dilitation. Venous: A systolic blunting flow pattern is recorded from the right upper pulmonary vein. The inferior vena cava is dilated in size with less than 50% respiratory variability, suggesting right atrial pressure of 15 mmHg. IAS/Shunts: No atrial level shunt detected by color flow Doppler.  LEFT VENTRICLE PLAX 2D                        Biplane EF (MOD) LVIDd:         4.05 cm         LV Biplane EF:   Left LVIDs:         2.80 cm                          ventricular LV PW:         1.10 cm                          ejection LV IVS:        1.25 cm                          fraction by LVOT diam:     2.30 cm                          2D MOD LV SV:         95  biplane is LV SV Index:   47                               66.6 %. LVOT Area:     4.15 cm LV IVRT:       74 msec         Diastology                                LV e' medial:    7.18 cm/s                                LV E/e' medial:  12.2 LV Volumes (MOD)               LV e' lateral:   12.70 cm/s LV vol d, MOD    70.5 ml       LV E/e' lateral: 6.9 A2C: LV vol d, MOD    69.8 ml A4C: LV vol s, MOD    29.8 ml A2C: LV vol s, MOD    18.6 ml A4C: LV SV MOD A2C:   40.7 ml LV SV MOD A4C:   69.8 ml LV SV MOD BP:    46.8 ml RIGHT VENTRICLE            IVC RV S prime:     8.81 cm/s  IVC diam: 3.10 cm TAPSE (M-mode): 1.9 cm                            PULMONARY VEINS                            Diastolic Velocity: 50.00 cm/s                            S/D Velocity:       1.00                            Systolic Velocity:  50.90 cm/s LEFT ATRIUM           Index        RIGHT ATRIUM           Index LA diam:      4.40 cm 2.17 cm/m   RA Area:     19.70 cm LA Vol  (A4C): 43.1 ml 21.29 ml/m  RA Volume:   61.90 ml  30.57 ml/m  AORTIC VALVE             PULMONIC VALVE LVOT Vmax:   119.00 cm/s PR End Diast Vel: 2.32 msec LVOT Vmean:  81.400 cm/s LVOT VTI:    0.228 m  AORTA Ao Root diam: 3.30 cm Ao Asc diam:  3.30 cm MITRAL VALVE               TRICUSPID VALVE MV Area (PHT): 3.42 cm    TR Peak grad:   55.1 mmHg MV Decel Time: 222 msec    TR Vmax:        371.00 cm/s MV E velocity: 87.35 cm/s MV A velocity: 89.25 cm/s  SHUNTS MV E/A ratio:  0.98  Systemic VTI:  0.23 m                            Systemic Diam: 2.30 cm Joelle Cedars Tonleu Electronically signed by Joelle Cedars Ny Signature Date/Time: 04/23/2024/4:35:08 PM    Final    DG Knee Left Port Result Date: 04/21/2024 EXAM: 1 or 2 VIEW(S) XRAY OF THE LEFT KNEE 04/21/2024 06:22:00 PM COMPARISON: 04/19/2024 CLINICAL HISTORY: Post-operative state FINDINGS: BONES AND JOINTS: Left total knee arthroplasty and lateral sideplate and screw fixation of the proximal left tibia in place. No acute fracture. No focal osseous lesion. No joint dislocation. No significant joint effusion. SOFT TISSUES: Expected soft tissue gas and edema. Vascular calcifications present. IMPRESSION: 1. Left total knee arthroplasty and lateral sideplate and screw fixation of the proximal left tibia in place. Expected postoperative soft tissue gas and edema. Vascular calcifications noted. Electronically signed by: Franky Stanford MD 04/21/2024 10:11 PM EST RP Workstation: [id number]   DG Knee 1-2 Views Left Result Date: 04/21/2024 EXAM: XR Left Knee Total Arthroplasty, 1 or 2 Views TECHNIQUE: Fluoroscopy was provided by the radiology department for procedure. Radiologist was not present during examination. FLUOROSCOPY DOSE AND TYPE: Fluoroscopy time 12 seconds. Radiation Dose Index: Reference Air Kerma (in mGy) =1.0 COMPARISON: None available. CLINICAL HISTORY: Elective surgery. FINDINGS: Intraoperative fluoroscopic imaging of left knee total  arthroplasty was performed. IMPRESSION: 1. Intraoperative fluoroscopic spot images of left knee total arthroplasty performed. NOTE: Intraoperative fluoroscopic spot images as above. Please refer to the intraoperative report for full details. Electronically signed by: Franky Stanford MD 04/21/2024 10:10 PM EST RP Workstation: [id number]   DG C-Arm 1-60 Min-No Report Result Date: 04/21/2024 Fluoroscopy was utilized by the requesting physician.  No radiographic interpretation.   DG C-Arm 1-60 Min-No Report Result Date: 04/21/2024 Fluoroscopy was utilized by the requesting physician.  No radiographic interpretation.   CT Knee Left Wo Contrast Result Date: 04/19/2024 EXAM: CT LEFT KNEE, WITHOUT IV CONTRAST 04/19/2024 04:25:37 AM TECHNIQUE: Axial images were acquired through the left knee without IV contrast. Reformatted images were reviewed. There is extensive metallic streak artifact from a total joint arthroplasty, limiting the study. Automated exposure control, iterative reconstruction, and/or weight based adjustment of the mA/kV was utilized to reduce the radiation dose to as low as reasonably achievable. COMPARISON: 4 views knee series earlier today. CLINICAL HISTORY: Knee trauma, tibial plateau fracture (Age >= 5y); Periprosthetic left anterior proximal tibial fracture, orthopedics requests additional evaluation with CT scan. FINDINGS: Technical limitations: There is extensive spray artifact related to metal hardware from a total joint arthroplasty. This significantly limits fine detail. BONES: There is a periprosthetic fracture about the tibial hardware involving the anterior proximal tibial metaphysis. The largest of the anterior fragments is displaced a few mm cephalad relative to the parent bone with slight upward rotation. No other displaced fracture is seen. There is generalized osteopenia. JOINTS: No joint dislocation. There are a number of small calcifications in the anterior joint space, which do  not appear related to the fracture. There is a hemarthrosis in the suprapatellar bursal space also seen with scattered calcifications within the hemorrhagic fluid. SOFT TISSUES: Diffuse stranding edema seen in soft tissues. There is a sizable proximal pretibial hematoma associated with the injury, maximum diameter of 8.6 cm transverse, 1.7 cm AP and 7.5 cm craniocaudal. No deep space collection is seen through the metal artifacts. There does appear to be a grossly normal muscle bulk for an 88 year old. The popliteal  trifurcation arteries show near circumferential moderate to heavy calcifications. The area tendons obscured by the metallic artifacts. IMPRESSION: 1. Periprosthetic fracture about the tibial hardware involving the anterior proximal tibial metaphysis, with the largest anterior fragment displaced a few millimeters cephalad with slight upward rotation. 2. Hemarthrosis in the suprapatellar bursal space with scattered calcifications within the hemorrhagic fluid. The calcifications are probably chronic. 3. Sizable proximal pretibial hematoma associated with the injury, measuring 8.6 x 1.7 x 7.5 cm. 4. Extensive metallic streak artifact from a total joint arthroplasty that limits evaluation. Electronically signed by: Francis Quam MD 04/19/2024 04:55 AM EST RP Workstation: [id number]   DG Chest Port 1 View Result Date: 04/19/2024 EXAM: 1 VIEW(S) XRAY OF THE CHEST 04/19/2024 01:48:00 AM COMPARISON: None available. CLINICAL HISTORY: Pre-op. FINDINGS: LUNGS AND PLEURA: Low lung volumes. No focal pulmonary opacity. No pleural effusion. No pneumothorax. HEART AND MEDIASTINUM: Aortic atherosclerosis. No acute abnormality of the cardiac and mediastinal silhouettes. BONES AND SOFT TISSUES: Partially imaged lumbar fusion hardware. Degenerative changes of right shoulder with calcific tendinopathy. Left shoulder arthroplasty noted. IMPRESSION: 1. No acute cardiopulmonary abnormality detected. Electronically signed by:  Norman Gatlin MD 04/19/2024 01:55 AM EST RP Workstation: [id number]   DG Thoracic Spine 2 View Result Date: 04/19/2024 EXAM: 2 VIEW(S) XRAY OF THE THORACIC SPINE 04/19/2024 01:48:00 AM COMPARISON: None available. CLINICAL HISTORY: pain/contusion after fall FINDINGS: BONES: Demineralization. No acute fracture. No aggressive appearing osseous lesion. Alignment is normal. DISCS AND DEGENERATIVE CHANGES: Multilevel age commensurate spondylosis. SOFT TISSUES: The visualized lungs are clear. IMPRESSION: 1. No acute abnormality of the thoracic spine. Electronically signed by: Norman Gatlin MD 04/19/2024 01:55 AM EST RP Workstation: [id number]   DG Hip Unilat W or Wo Pelvis 2-3 Views Left Result Date: 04/19/2024 EXAM: 2 OR MORE VIEW(S) XRAY OF THE PELVIS AND LEFT HIP 04/19/2024 01:48:00 AM COMPARISON: None available. CLINICAL HISTORY: pain/contusion after fall FINDINGS: BONES AND JOINTS: SI joints are symmetric. No acute fracture. Bilateral hips demonstrate normal alignment. Mild degenerative changes of bilateral hip joints characterized by mild joint space narrowing and osteophytosis of the superior acetabulum. LUMBAR SPINE: Partially seen lumbar spine fusion hardware. SOFT TISSUES: Vascular calcifications noted. IMPRESSION: 1. No acute abnormality of the left hip. Electronically signed by: Norman Gatlin MD 04/19/2024 01:53 AM EST RP Workstation: [id number]   DG Knee Complete 4 Views Left Result Date: 04/19/2024 EXAM: 4 VIEW(S) XRAY OF THE LEFT KNEE 04/19/2024 01:48:00 AM COMPARISON: None available. CLINICAL HISTORY: [id number] Injury [id number] FINDINGS: BONES AND JOINTS: Total knee arthroplasty in place. Displaced periprosthetic fracture of proximal anterior tibia. No joint dislocation. No significant joint effusion. SOFT TISSUES: Subcutaneous soft tissue edema. Vascular calcifications. IMPRESSION: 1. Displaced periprosthetic fracture of proximal anterior tibia. Electronically signed by: Norman Gatlin MD  04/19/2024 01:52 AM EST RP Workstation: [id number]    Microbiology: No results found for this or any previous visit.  Labs: CBC: Recent Labs  Lab 04/20/24 0345 04/20/24 1146 04/20/24 1746 04/21/24 0300 04/22/24 0435 04/23/24 0544 04/24/24 0341  WBC 6.2   < > 7.4 6.7 6.1 6.4 6.6  NEUTROABS 3.4  --   --  3.8  --   --   --   HGB 6.9*   < > 8.2* 8.0* 8.6* 8.3* 8.1*  HCT 21.6*   < > 24.8* 24.1* 26.0* 24.9* 25.5*  MCV 96.9   < > 95.4 95.6 95.9 95.4 98.8  PLT 125*   < > 114* 120* 121* 153 177   < > = values in this interval not displayed.  Basic Metabolic Panel: Recent Labs  Lab 04/20/24 0345 04/21/24 0300 04/22/24 0435 04/23/24 0544 04/24/24 0341  NA 133* 135 132* 134* 131*  K 4.4 4.6 4.8 4.5 4.7  CL 103 102 98 101 99  CO2 21* 23 26 26 25   GLUCOSE 114* 114* 108* 122* 107*  BUN 17 10 13 13 14   CREATININE 0.88 0.82 0.74 0.75 0.72  CALCIUM  8.0* 8.3* 8.3* 8.4* 8.6*  MG  --   --   --  1.9 1.9  PHOS  --   --   --  2.8 2.5   Liver Function Tests: Recent Labs  Lab 04/19/24 1446 04/20/24 0345 04/21/24 0300 04/23/24 0544 04/24/24 0341  AST 30 27 31   --   --   ALT 20 18 19   --   --   ALKPHOS 37* 34* 40  --   --   BILITOT 0.6 0.5 0.9  --   --   PROT 5.2* 4.7* 4.8*  --   --   ALBUMIN 3.0* 2.6* 2.4* 2.8* 2.8*   CBG: No results for input(s): GLUCAP in the last 168 hours.  Discharge time spent: greater than 30 minutes.  Signed: Porfirio Bollier, DO [facility] 04/26/2024

## 2024-05-29 ENCOUNTER — Ambulatory Visit: Admitting: Nurse Practitioner

## 2024-06-18 ENCOUNTER — Ambulatory Visit: Admitting: Nurse Practitioner

## 2024-06-19 ENCOUNTER — Telehealth: Payer: Self-pay | Admitting: Nurse Practitioner

## 2024-06-19 NOTE — Telephone Encounter (Signed)
"  ° °  Pre-operative Risk Assessment    Patient Name: [name]  DOB: September 15, 1935 MRN: [id number]   Date of last office visit: n/a Date of next office visit: 06/25/24   Request for Surgical Clearance    Procedure:  left patella tendon repair  Date of Surgery:  Clearance 06/26/24                                Surgeon:  not indicated Surgeon's Group or Practice Name:  [facility] Phone number:  [phone] Fax number:  [phone]   Type of Clearance Requested:   - Medical    Type of Anesthesia:  choice   Additional requests/questions:    Bonney Rosina Stamps   06/19/2024, 4:01 PM   "

## 2024-06-20 ENCOUNTER — Other Ambulatory Visit: Payer: Self-pay

## 2024-06-20 ENCOUNTER — Encounter (HOSPITAL_BASED_OUTPATIENT_CLINIC_OR_DEPARTMENT_OTHER): Payer: Self-pay | Admitting: Orthopaedic Surgery

## 2024-06-20 NOTE — Telephone Encounter (Signed)
 Pt has appt in office 06/25/24 Medford Meager, FNP. I tried to call the pt today to see if she wanted to come in today , though her vm is full. Looks pt made her appt on 06/17/24.

## 2024-06-20 NOTE — Telephone Encounter (Signed)
" ° ° °  Primary Cardiologist:None  Chart reviewed as part of pre-operative protocol coverage. Because of [name]'s past medical history and time since last visit, he/she will require a follow-up visit in order to better assess preoperative cardiovascular risk.  Pre-op covering staff: - Please schedule office appointment and call patient to inform them. - Please contact requesting surgeon's office via preferred method (i.e, phone, fax) to inform them of need for appointment prior to surgery.  If applicable, this message will also be routed to pharmacy pool and/or primary cardiologist for input on holding anticoagulant/antiplatelet agent as requested below so that this information is available at time of patient's appointment.   Brooke CHRISTELLA Beauvais, NP  06/20/2024, 7:12 AM   "

## 2024-06-25 ENCOUNTER — Ambulatory Visit: Payer: HMO | Admitting: Dermatology

## 2024-06-25 ENCOUNTER — Encounter: Payer: Self-pay | Admitting: Nurse Practitioner

## 2024-06-25 ENCOUNTER — Ambulatory Visit

## 2024-06-25 VITALS — BP 132/68 | HR 67 | Ht 69.0 in | Wt 170.0 lb

## 2024-06-25 DIAGNOSIS — I10 Essential (primary) hypertension: Secondary | ICD-10-CM

## 2024-06-25 DIAGNOSIS — I493 Ventricular premature depolarization: Secondary | ICD-10-CM | POA: Diagnosis not present

## 2024-06-25 DIAGNOSIS — Z01818 Encounter for other preprocedural examination: Secondary | ICD-10-CM | POA: Diagnosis not present

## 2024-06-25 DIAGNOSIS — E782 Mixed hyperlipidemia: Secondary | ICD-10-CM

## 2024-06-25 DIAGNOSIS — R600 Localized edema: Secondary | ICD-10-CM

## 2024-06-25 NOTE — Patient Instructions (Signed)
 Medication Instructions:  No changes *If you need a refill on your cardiac medications before your next appointment, please call your pharmacy*  Lab Work: Your provider would like for you to have the following labs today: BMET  If you have labs (blood work) drawn today and your tests are completely normal, you will receive your results only by: MyChart Message (if you have MyChart) OR A paper copy in the mail If you have any lab test that is abnormal or we need to change your treatment, we will call you to review the results.  Testing/Procedures: None ordered  Follow-Up: At [facility], you and your health needs are our priority.  As part of our continuing mission to provide you with exceptional heart care, our providers are all part of one team.  This team includes your primary Cardiologist (physician) and Advanced Practice Providers or APPs (Physician Assistants and Nurse Practitioners) who all work together to provide you with the care you need, when you need it.  Your next appointment:   6 month(s)  Provider:   Lonni Meager, NP    We recommend signing up for the patient portal called MyChart.  Sign up information is provided on this After Visit Summary.  MyChart is used to connect with patients for Virtual Visits (Telemedicine).  Patients are able to view lab/test results, encounter notes, upcoming appointments, etc.  Non-urgent messages can be sent to your provider as well.   To learn more about what you can do with MyChart, go to [email or web address].

## 2024-06-26 ENCOUNTER — Other Ambulatory Visit: Payer: Self-pay

## 2024-06-26 ENCOUNTER — Ambulatory Visit (HOSPITAL_BASED_OUTPATIENT_CLINIC_OR_DEPARTMENT_OTHER)
Admission: RE | Admit: 2024-06-26 | Discharge: 2024-06-26 | Disposition: A | Source: Home / Self Care | Attending: Orthopaedic Surgery | Admitting: Orthopaedic Surgery

## 2024-06-26 ENCOUNTER — Ambulatory Visit (HOSPITAL_BASED_OUTPATIENT_CLINIC_OR_DEPARTMENT_OTHER)

## 2024-06-26 ENCOUNTER — Encounter (HOSPITAL_BASED_OUTPATIENT_CLINIC_OR_DEPARTMENT_OTHER): Payer: Self-pay | Admitting: Orthopaedic Surgery

## 2024-06-26 ENCOUNTER — Ambulatory Visit (HOSPITAL_COMMUNITY)

## 2024-06-26 ENCOUNTER — Ambulatory Visit (HOSPITAL_BASED_OUTPATIENT_CLINIC_OR_DEPARTMENT_OTHER): Admitting: Anesthesiology

## 2024-06-26 ENCOUNTER — Ambulatory Visit: Payer: Self-pay

## 2024-06-26 ENCOUNTER — Encounter (HOSPITAL_BASED_OUTPATIENT_CLINIC_OR_DEPARTMENT_OTHER): Admission: RE | Disposition: A | Payer: Self-pay | Source: Home / Self Care | Attending: Orthopaedic Surgery

## 2024-06-26 HISTORY — DX: Hyperlipidemia, unspecified: E78.5

## 2024-06-26 HISTORY — DX: Cerebral infarction, unspecified: I63.9

## 2024-06-26 LAB — BASIC METABOLIC PANEL WITH GFR
BUN/Creatinine Ratio: 17 (ref 12–28)
BUN: 13 mg/dL (ref 8–27)
CO2: 21 mmol/L (ref 20–29)
Calcium: 9.7 mg/dL (ref 8.7–10.3)
Chloride: 104 mmol/L (ref 96–106)
Creatinine, Ser: 0.77 mg/dL (ref 0.57–1.00)
Glucose: 81 mg/dL (ref 70–99)
Potassium: 4.7 mmol/L (ref 3.5–5.2)
Sodium: 140 mmol/L (ref 134–144)
eGFR: 75 mL/min/{1.73_m2}

## 2024-06-26 MED ORDER — ASPIRIN 81 MG PO CHEW: 81.0000 mg | CHEWABLE_TABLET | Freq: Two times a day (BID) | ORAL | 0 refills | Status: AC

## 2024-06-26 MED ORDER — MIDAZOLAM HCL 2 MG/2ML IJ SOLN
INTRAMUSCULAR | Status: AC
  Filled 2024-06-26: qty 2

## 2024-06-26 MED ORDER — CEFAZOLIN SODIUM-DEXTROSE 2-4 GM/100ML-% IV SOLN
INTRAVENOUS | Status: AC
  Filled 2024-06-26: qty 100

## 2024-06-26 MED ORDER — CLONIDINE HCL (ANALGESIA) 100 MCG/ML EP SOLN
EPIDURAL | Status: DC | PRN
  Administered 2024-06-26: 100 ug

## 2024-06-26 MED ORDER — ONDANSETRON 4 MG PO TBDP: 4.0000 mg | ORAL_TABLET | Freq: Three times a day (TID) | ORAL | 0 refills | Status: AC | PRN

## 2024-06-26 MED ORDER — OXYCODONE HCL 5 MG PO TABS: ORAL_TABLET | ORAL | 0 refills | Status: AC

## 2024-06-26 MED ORDER — CEFADROXIL 500 MG PO CAPS: 500.0000 mg | ORAL_CAPSULE | Freq: Two times a day (BID) | ORAL | 0 refills | Status: AC

## 2024-06-26 MED ORDER — ACETAMINOPHEN 500 MG PO TABS: 1000.0000 mg | ORAL_TABLET | Freq: Three times a day (TID) | ORAL | 0 refills | Status: AC

## 2024-06-26 MED ORDER — PROPOFOL 10 MG/ML IV BOLUS
INTRAVENOUS | Status: AC
  Filled 2024-06-26: qty 20

## 2024-06-26 MED ORDER — LIDOCAINE 2% (20 MG/ML) 5 ML SYRINGE
INTRAMUSCULAR | Status: AC
  Filled 2024-06-26: qty 5

## 2024-06-26 MED ORDER — 0.9 % SODIUM CHLORIDE (POUR BTL) OPTIME
TOPICAL | Status: DC | PRN
  Administered 2024-06-26: 1000 mL

## 2024-06-26 MED ORDER — FENTANYL CITRATE (PF) 100 MCG/2ML IJ SOLN
INTRAMUSCULAR | Status: AC
  Filled 2024-06-26: qty 2

## 2024-06-26 MED ORDER — GABAPENTIN 300 MG PO CAPS: 300.0000 mg | ORAL_CAPSULE | Freq: Once | ORAL | Status: DC

## 2024-06-26 MED ORDER — CELECOXIB 100 MG PO CAPS: 100.0000 mg | ORAL_CAPSULE | Freq: Two times a day (BID) | ORAL | 0 refills | Status: AC

## 2024-06-26 MED ORDER — ACETAMINOPHEN 500 MG PO TABS
ORAL_TABLET | ORAL | Status: AC
  Filled 2024-06-26: qty 2

## 2024-06-26 MED ORDER — FENTANYL CITRATE (PF) 100 MCG/2ML IJ SOLN
INTRAMUSCULAR | Status: DC | PRN
  Administered 2024-06-26 (×2): 25 ug via INTRAVENOUS
  Administered 2024-06-26: 50 ug via INTRAVENOUS

## 2024-06-26 MED ORDER — DEXAMETHASONE SOD PHOSPHATE PF 10 MG/ML IJ SOLN
INTRAMUSCULAR | Status: AC
  Filled 2024-06-26: qty 1

## 2024-06-26 MED ORDER — PHENYLEPHRINE HCL-NACL 20-0.9 MG/250ML-% IV SOLN
INTRAVENOUS | Status: DC | PRN
  Administered 2024-06-26: 50 ug/min via INTRAVENOUS

## 2024-06-26 MED ORDER — PHENYLEPHRINE HCL (PRESSORS) 10 MG/ML IV SOLN
INTRAVENOUS | Status: DC | PRN
  Administered 2024-06-26: 80 ug via INTRAVENOUS
  Administered 2024-06-26: 160 ug via INTRAVENOUS
  Administered 2024-06-26: 80 ug via INTRAVENOUS
  Administered 2024-06-26 (×2): 160 ug via INTRAVENOUS

## 2024-06-26 MED ORDER — PROPOFOL 10 MG/ML IV BOLUS
INTRAVENOUS | Status: DC | PRN
  Administered 2024-06-26: 130 mg via INTRAVENOUS

## 2024-06-26 MED ORDER — VANCOMYCIN HCL 1 G IV SOLR
INTRAVENOUS | Status: DC | PRN
  Administered 2024-06-26: 1000 mg

## 2024-06-26 MED ORDER — BUPIVACAINE-EPINEPHRINE (PF) 0.5% -1:200000 IJ SOLN
INTRAMUSCULAR | Status: DC | PRN
  Administered 2024-06-26: 30 mL via PERINEURAL

## 2024-06-26 MED ORDER — FENTANYL CITRATE (PF) 100 MCG/2ML IJ SOLN
50.0000 ug | Freq: Once | INTRAMUSCULAR | Status: AC
  Administered 2024-06-26: 50 ug via INTRAVENOUS

## 2024-06-26 MED ORDER — ACETAMINOPHEN 500 MG PO TABS
1000.0000 mg | ORAL_TABLET | Freq: Once | ORAL | Status: AC
  Administered 2024-06-26: 1000 mg via ORAL

## 2024-06-26 MED ORDER — LIDOCAINE 2% (20 MG/ML) 5 ML SYRINGE
INTRAMUSCULAR | Status: DC | PRN
  Administered 2024-06-26: 100 mg via INTRAVENOUS

## 2024-06-26 MED ORDER — CEFAZOLIN SODIUM-DEXTROSE 2-4 GM/100ML-% IV SOLN
2.0000 g | INTRAVENOUS | Status: AC
  Administered 2024-06-26: 2 g via INTRAVENOUS

## 2024-06-26 MED ORDER — DEXAMETHASONE SOD PHOSPHATE PF 10 MG/ML IJ SOLN
INTRAMUSCULAR | Status: DC | PRN
  Administered 2024-06-26: 5 mg via INTRAVENOUS

## 2024-06-26 MED ORDER — OXYCODONE HCL 5 MG/5ML PO SOLN: 5.0000 mg | Freq: Once | ORAL | Status: AC | PRN

## 2024-06-26 MED ORDER — DROPERIDOL 2.5 MG/ML IJ SOLN: 0.6250 mg | Freq: Once | INTRAMUSCULAR | Status: DC | PRN

## 2024-06-26 MED ORDER — ONDANSETRON HCL 4 MG/2ML IJ SOLN
INTRAMUSCULAR | Status: AC
  Filled 2024-06-26: qty 2

## 2024-06-26 MED ORDER — LACTATED RINGERS IV SOLN: INTRAVENOUS | Status: DC

## 2024-06-26 MED ORDER — OXYCODONE HCL 5 MG PO TABS
5.0000 mg | ORAL_TABLET | Freq: Once | ORAL | Status: AC | PRN
  Administered 2024-06-26: 5 mg via ORAL

## 2024-06-26 MED ORDER — GABAPENTIN 300 MG PO CAPS
ORAL_CAPSULE | ORAL | Status: AC
  Filled 2024-06-26: qty 1

## 2024-06-26 MED ORDER — ONDANSETRON HCL 4 MG/2ML IJ SOLN
INTRAMUSCULAR | Status: DC | PRN
  Administered 2024-06-26: 4 mg via INTRAVENOUS

## 2024-06-26 MED ORDER — FENTANYL CITRATE (PF) 100 MCG/2ML IJ SOLN: 25.0000 ug | INTRAMUSCULAR | Status: DC | PRN

## 2024-06-26 MED ORDER — PHENYLEPHRINE 80 MCG/ML (10ML) SYRINGE FOR IV PUSH (FOR BLOOD PRESSURE SUPPORT)
PREFILLED_SYRINGE | INTRAVENOUS | Status: AC
  Filled 2024-06-26: qty 10

## 2024-06-26 MED ORDER — OXYCODONE HCL 5 MG PO TABS
ORAL_TABLET | ORAL | Status: AC
  Filled 2024-06-26: qty 1

## 2024-06-26 NOTE — Interval H&P Note (Signed)
 All questions answered, patient wants to proceed with procedure. ? ?

## 2024-06-26 NOTE — Discharge Instructions (Addendum)
 " Bonner Hair MD, MPH [facility] [address] [phone] (tel)   [phone] (fax)   POST-OPERATIVE INSTRUCTIONS - LOWER EXTREMITY   WOUND CARE Please keep splint clean dry and intact until followup.  You may shower on Post-Op Day #3.  You must keep splint dry during this process and may find that a plastic bag taped around the leg or alternatively a towel based bath may be a better option.   If you get your splint wet or if it is damaged please contact our clinic. Keep your prevena wound vac in place  EXERCISES You may put weight on your operative leg as you feel comfortable Use crutches or a walker to help you weight bear  REGIONAL ANESTHESIA (NERVE BLOCKS) The anesthesia team may have performed a nerve block for you this is a great tool used to minimize pain.   The block may start wearing off overnight (between 8-24 hours postop) When the block wears off, your pain may go from nearly zero to the pain you would have had postop without the block. This is an abrupt transition but nothing dangerous is happening.   This can be a challenging period but utilize your as needed pain medications to try and manage this period. We suggest you use the pain medication the first night prior to going to bed, to ease this transition.  You may take an extra dose of narcotic when this happens if needed   POST-OP MEDICATIONS- Multimodal approach to pain control In general your pain will be controlled with a combination of substances.  Prescriptions unless otherwise discussed are electronically sent to your pharmacy.  This is a carefully made plan we use to minimize narcotic use.     Celebrex  - Anti-inflammatory medication taken on a scheduled basis Acetaminophen  - Non-narcotic pain medicine taken on a scheduled basis  Oxycodone  - This is a strong narcotic, to be used only on an as needed basis for SEVERE pain. Aspirin  81mg  - This medicine is used to minimize  the risk of blood clots after surgery. Cefadroxil  - this is an antibiotic, take as instructed Zofran  - take as needed for nausea   FOLLOW-UP If you develop a Fever (>101.5), Redness or Drainage from the surgical incision site, please call our office to arrange for an evaluation. Please call the office to schedule a follow-up appointment for your incision check if you do not already have one, 7-10 days post-operatively.  IF YOU HAVE ANY QUESTIONS, PLEASE FEEL FREE TO CALL OUR OFFICE.  HELPFUL INFORMATION  If you had a block, it will wear off between 8-24 hrs postop typically.  This is period when your pain may go from nearly zero to the pain you would have had postop without the block.  This is an abrupt transition but nothing dangerous is happening.  You may take an extra dose of narcotic when this happens.  You should wean off your narcotic medicines as soon as you are able.  Most patients will be off narcotics before their first postop appointment.   We suggest you use the pain medication the first night prior to going to bed, in order to ease any pain when the anesthesia wears off. You should avoid taking pain medications on an empty stomach as it will make you nauseous.  Do not drink alcoholic beverages or take illicit drugs when taking pain medications.  In most states it is against the law to drive while you are in a splint  or sling.  And certainly against the law to drive while taking narcotics.  You may return to work/school in the next couple of days when you feel up to it.   Pain medication may make you constipated.  Below are a few solutions to try in this order: Decrease the amount of pain medication if you arent having pain. Drink lots of decaffeinated fluids. Drink prune juice and/or each dried prunes  If the first 3 dont work start with additional solutions Take Colace - an over-the-counter stool softener Take Senokot - an over-the-counter laxative Take Miralax  - a  stronger over-the-counter laxative  For more information including helpful videos and documents visit our website:   [email or web address]    Post Anesthesia Home Care Instructions  Activity: Get plenty of rest for the remainder of the day. A responsible individual must stay with you for 24 hours following the procedure.  For the next 24 hours, DO NOT: -Drive a car -Advertising copywriter -Drink alcoholic beverages -Take any medication unless instructed by your physician -Make any legal decisions or sign important papers.  Meals: Start with liquid foods such as gelatin or soup. Progress to regular foods as tolerated. Avoid greasy, spicy, heavy foods. If nausea and/or vomiting occur, drink only clear liquids until the nausea and/or vomiting subsides. Call your physician if vomiting continues.  Special Instructions/Symptoms: Your throat may feel dry or sore from the anesthesia or the breathing tube placed in your throat during surgery. If this causes discomfort, gargle with warm salt water. The discomfort should disappear within 24 hours.  Regional Anesthesia Blocks  1. You may not be able to move or feel the blocked extremity after a regional anesthetic block. This may last may last from 3-48 hours after placement, but it will go away. The length of time depends on the medication injected and your individual response to the medication. As the nerves start to wake up, you may experience tingling as the movement and feeling returns to your extremity. If the numbness and inability to move your extremity has not gone away after 48 hours, please call your surgeon.   2. The extremity that is blocked will need to be protected until the numbness is gone and the strength has returned. Because you cannot feel it, you will need to take extra care to avoid injury. Because it may be weak, you may have difficulty moving it or using it. You may not know what position it is in  without looking at it while the block is in effect.  3. For blocks in the legs and feet, returning to weight bearing and walking needs to be done carefully. You will need to wait until the numbness is entirely gone and the strength has returned. You should be able to move your leg and foot normally before you try and bear weight or walk. You will need someone to be with you when you first try to ensure you do not fall and possibly risk injury.  4. Bruising and tenderness at the needle site are common side effects and will resolve in a few days.  5. Persistent numbness or new problems with movement should be communicated to the surgeon or the [facility] [phone] [facility] [phone]).    Can have tylenol  after 2:15 pm if needed "

## 2024-06-26 NOTE — Progress Notes (Signed)
 Assisted Dr. Paul with left, femoral, ultrasound guided block. Side rails up, monitors on throughout procedure. See vital signs in flow sheet. Tolerated Procedure well.

## 2024-06-26 NOTE — Transfer of Care (Signed)
 Immediate Anesthesia Transfer of Care Note  Patient: [name]  Procedure(s) Performed: REPAIR, TENDON, PATELLAR (Left: Knee) RECONSTRUCTION, LIGAMENT, MEDIAL COLLATERAL, KNEE (Left: Knee)  Patient Location: PACU  Anesthesia Type:General and Regional  Level of Consciousness: awake, alert , and patient cooperative  Airway & Oxygen Therapy: Patient Spontanous Breathing and Patient connected to face mask oxygen  Post-op Assessment: Report given to RN and Post -op Vital signs reviewed and stable  Post vital signs: Reviewed and stable  Last Vitals:  Vitals Value Taken Time  BP    Temp    Pulse    Resp    SpO2      Last Pain:  Vitals:   06/26/24 0813  TempSrc: Temporal  PainSc: 0-No pain      Patients Stated Pain Goal: 2 (06/26/24 0813)  Complications: No notable events documented.

## 2024-06-26 NOTE — Anesthesia Procedure Notes (Signed)
 Anesthesia Regional Block: Femoral nerve block   Pre-Anesthetic Checklist: , timeout performed,  Correct Patient, Correct Site, Correct Laterality,  Correct Procedure, Correct Position, site marked,  Risks and benefits discussed,  Pre-op evaluation,  At surgeon's request and post-op pain management  Laterality: Left  Prep: Maximum Sterile Barrier Precautions used, chloraprep       Needles:  Injection technique: Single-shot  Needle Type: Echogenic Stimulator Needle     Needle Length: 9cm  Needle Gauge: 22     Additional Needles:   Procedures:,,,, ultrasound used (permanent image in chart),,    Narrative:  Start time: 06/26/2024 9:53 AM End time: 06/26/2024 9:56 AM Injection made incrementally with aspirations every 5 mL.  Performed by: Personally  Anesthesiologist: Paul Lamarr BRAVO, MD  Additional Notes: Risks, benefits, and alternative discussed. Patient gave consent for procedure. Patient prepped and draped in sterile fashion. Sedation administered, patient remains easily responsive to voice. Relevant anatomy identified with ultrasound guidance. Local anesthetic given in 5cc increments with no signs or symptoms of intravascular injection. No pain or paraesthesias with injection. Patient monitored throughout procedure with no signs of LAST or immediate complications. Tolerated well. Ultrasound image placed in chart.  LANEY Paul, MD

## 2024-06-26 NOTE — Op Note (Signed)
 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   Patient had a complete disruption of her previous tibial plateau fracture and extensor mechanism.  Her bone fragments and tendon were quite retracted.  I was able to perform a revision ORIF of the remainder of her bone while excising some fragments and augment with a allograft Achilles graft.  The graft itself was inlaid into the tibial tubercle and we used a plate to secure this as well as fix the fracture portion of her plateau that was amenable to fixation.  Some of this had to be removed as it was nonarticular.  Upon evaluating arthrotomy there was some fibrinous tissue that was on healthy appearing and I felt that was appropriate for synovial biopsy and washout.  I was able to irrigate 3 L normal saline through the joint.  There was no gross purulence.  I did take samples of at the time of the surgery which were being held while placing the patient on suppressive antibiotics.  Patient is extremely high risk of complication including fall, wound breakdown, extensor mechanism disruption and stiffness if nothing else.  That said I felt that this was appropriate to try and get her better motion and keep her independent.  Post-operative plan: The patient will be touchdown weightbearing with a long-leg splint transition to a cast after Prevena dressing was removed.  The patient will be discharged home.  DVT  prophylaxis Aspirin  81 mg twice daily for 6 weeks.   Pain control with PRN pain medication preferring oral medicines.  Follow up plan will be scheduled in approximately 7 days for incision check and XR.  Post-Op Diagnosis: Same Surgeons:Primary: Cristy Bonner DASEN, MD Assistants:Caroline McBane, PA-C Location: [facility] OR ROOM 1 Anesthesia: General with regional anesthesia Antibiotics: Ancef  2 g with local vancomycin  powder 1 g at the surgical site Tourniquet time:  Total Tourniquet Time Documented: Thigh (Left) - 91 minutes Total: Thigh (Left) - 91 minutes  Estimated Blood Loss: Minimal Complications: None Specimens: 3 for culture left knee 1 2 and 3 Implants: Implant Name Type Inv. Item Serial No. Manufacturer Lot No. LRB No. Used Action  GRAFT ACHILLES CALC BNE BLCK - [id number] Bone Implant GRAFT ACHILLES CALC BNE [facility]  [employer]  Left 1 Implanted  [facility] SUT ABSORB 5.5X14.7 W/2 - [id number] Anchor ANCH SUT ABSORB 5.5X14.7 W/2  [employer] [id number] Left 1 Implanted  PLATE LOCKING 1/3 TUB 5H - [id number] Plate PLATE LOCKING 1/3 TUB 5H  [employer]  Left 1 Implanted  screw locking Screw   [employer]  Left 1 Implanted  locking locking Screw   [employer]  Left 1 Implanted  locking screw Screw   [employer]  Left 1 Implanted  partial thread screw Screw   [employer]  Left 1 Implanted  IMPL SYS 2ND FX PEEK 4.75X19.1 - [id number] Miscellaneous IMPL SYS 2ND FX PEEK 4.75X19.1  [employer] [id number] Left 1 Implanted    Indications for Surgery:   [name] is a 89 y.o. female with extensor mechanism disruption with tibial  plateau fracture.  Benefits and risks of operative and nonoperative management were discussed prior to surgery with patient/guardian(s) and informed consent form was completed.  Specific risks including infection, need for additional surgery, rerupture, stiffness, wound breakdown amongst others.   Procedure:   The patient was identified properly. Informed consent  was obtained and the surgical site was marked. The patient was taken up to suite where general anesthesia was induced.  The patient was positioned supine on a regular bed.  The left knee was prepped and draped in the usual sterile fashion.  Timeout was performed before the beginning of the case.  Tourniquet was used for the above duration.  We began by using the patient's previous incision.  With the skin sharply achieving hemostasis as we progressed.  We identified the seroma associated with the chronic injury.  I was able to identify that the entirety of the anterior tibia had been fractured off and pulled through the previously placed plate.  The plate was removed without issue as it was no longer holding the bone.  We then mobilized the tissue and performed arthrotomy and found that there was some fibrinous tissue within the joint that was somewhat concerning in appearance.  Was able to irrigate 3 L and perform an incision and debridement sharply excising any unhealthy appearing tissue down to healthy bed of bone and soft tissue using a knife and rondure.  At this point we took stock of our extensor mechanism.  There was some bone that was not going to be appropriate for repair it was excised.  ` We a great deal of time performing a extensor mechanism and quadriceps tendon lengthening to try and mobilize this chronic retracted tear.  This was a separate part of the case that was outside of the normal spectrum of a simple extensor mechanism repair.  I then felt that augmentation of her extensor mechanism was appropriate.  We cut a trough into the anterior tibia distal to tibial tubercle slightly and inlaid a Achilles allograft bone plug measuring 15 x 15 x 20mm in length.  I was able to mobilize some of the bone from the tibial plateau and perform an open reduction fixation using a plate from [employer] while using the same plate to secure our tibial bone plug.  Once the open reduction term fixation was  performed I then turned my attention to the extensor mechanism reconstruction.  We used the Achilles graft which was fixated with a suture anchor in the anterior medial tibia distally and were able to anchor it to the patella placing a corkscrew anchor 5.5 mm in size.  I then was able to overlap the patellar tendon remnant and the graft with the graft more superficial and whipstitched using running locking Krakw sutures from each of the 2 sutures and the corkscrew into the native tissue as well as the Achilles allograft.  We were able to tie these with alternating half hitches compressing tissue to bone and securing the extensor mechanism.  The remaining leaflet of the Achilles was brought over the patella and stick tied using suture tape as well as #1 Vicryl to the quadriceps as well as the patellar retinaculum.  We then identified that there is a large defect on the medial aspect of the retinaculum as well as a peelback of the MCL.  I was able to use suture tape to repair the MCL back to our graft to avoid further pulling loose.  The MCL took good tension at the end of  the case and the repair.  I was then able to use 0 Vicryl to close medial and lateral retinaculum back to her graft.  We placed a fiber tape and a #5 FiberWire in the distal aspect of the quadriceps tendon and internally braced tape bring these down to a anterior medial peek swivel lock in the tibia.  We used sutures from the safety stitch on this anchor to place into our graft at the distal aspect to backup our fixation.   We irrigated the wound copiously before placing local antibiotic as listed above.  We closed the incision in a multilayer fashion with nonabsorbable suture.  Sterile dressing was placed in the form of a Prevena wound VAC.  Patient was awoken taken to PACU in stable condition.  Aleck Stalling, PA-C, present and scrubbed throughout the case, critical for completion in a timely fashion, and for retraction,  instrumentation, closure.

## 2024-06-26 NOTE — Anesthesia Postprocedure Evaluation (Signed)
"   Anesthesia Post Note  Patient: [name]  Procedure(s) Performed: REPAIR, TENDON, PATELLAR (Left: Knee) RECONSTRUCTION, LIGAMENT, MEDIAL COLLATERAL, KNEE WITH ALLOGRAFT (Left: Knee)     Patient location during evaluation: PACU Anesthesia Type: General and Regional Level of consciousness: awake and alert Pain management: pain level controlled Vital Signs Assessment: post-procedure vital signs reviewed and stable Respiratory status: spontaneous breathing, nonlabored ventilation, respiratory function stable and patient connected to nasal cannula oxygen Cardiovascular status: blood pressure returned to baseline and stable Postop Assessment: no apparent nausea or vomiting Anesthetic complications: no   No notable events documented.  Last Vitals:  Vitals:   06/26/24 1315 06/26/24 1340  BP: (!) 125/52 (!) 151/78  Pulse: 66 67  Resp: 15 16  Temp:  (!) 36.2 C  SpO2: 95% 96%    Last Pain:  Vitals:   06/26/24 1340  TempSrc: Temporal  PainSc: 5                  [name] S      "

## 2024-06-26 NOTE — Anesthesia Preprocedure Evaluation (Addendum)
"                                    Anesthesia Evaluation  Patient identified by MRN, date of birth, ID band Patient awake    Reviewed: Allergy & Precautions, NPO status , Patient's Chart, lab work & pertinent test results  History of Anesthesia Complications Negative for: history of anesthetic complications  Airway Mallampati: II  TM Distance: >3 FB Neck ROM: Full    Dental no notable dental hx.    Pulmonary neg pulmonary ROS   Pulmonary exam normal        Cardiovascular hypertension, Pt. on medications Normal cardiovascular exam     Neuro/Psych CVA (2017), No Residual Symptoms    GI/Hepatic Neg liver ROS,GERD  Medicated,,  Endo/Other  negative endocrine ROS    Renal/GU negative Renal ROS     Musculoskeletal  (+) Arthritis ,  LEFT PATELLA TENDON TEAR   Abdominal   Peds  Hematology negative hematology ROS (+)   Anesthesia Other Findings   Reproductive/Obstetrics                              Anesthesia Physical Anesthesia Plan  ASA: 3  Anesthesia Plan: General   Post-op Pain Management: Tylenol  PO (pre-op)* and Regional block*   Induction: Intravenous  PONV Risk Score and Plan: 3 and Treatment may vary due to age or medical condition, Ondansetron  and Dexamethasone   Airway Management Planned: LMA  Additional Equipment: None  Intra-op Plan:   Post-operative Plan: Extubation in OR  Informed Consent: I have reviewed the patients History and Physical, chart, labs and discussed the procedure including the risks, benefits and alternatives for the proposed anesthesia with the patient or authorized representative who has indicated his/her understanding and acceptance.     Dental advisory given  Plan Discussed with: CRNA  Anesthesia Plan Comments:          Anesthesia Quick Evaluation  "

## 2024-06-27 LAB — AEROBIC/ANAEROBIC CULTURE W GRAM STAIN (SURGICAL/DEEP WOUND)
Culture: NO GROWTH
Culture: NO GROWTH
Culture: NO GROWTH

## 2024-09-23 ENCOUNTER — Ambulatory Visit: Admitting: Dermatology
